# Patient Record
Sex: Male | Born: 1945 | Race: White | Marital: Married | Smoking: Former smoker
Health system: Northeastern US, Academic
[De-identification: ages and names within clinical notes are randomized; demographics above are authoritative.]

## PROBLEM LIST (undated history)

## (undated) DIAGNOSIS — I4891 Unspecified atrial fibrillation: Secondary | ICD-10-CM

## (undated) HISTORY — DX: Unspecified atrial fibrillation: I48.91

## (undated) HISTORY — PX: PROSTATE SURGERY: SHX751

## (undated) HISTORY — PX: PARTIAL NEPHRECTOMY: SHX414

---

## 2005-04-29 DIAGNOSIS — F528 Other sexual dysfunction not due to a substance or known physiological condition: Secondary | ICD-10-CM | POA: Insufficient documentation

## 2005-04-29 DIAGNOSIS — Z9889 Other specified postprocedural states: Secondary | ICD-10-CM | POA: Insufficient documentation

## 2005-04-29 DIAGNOSIS — N200 Calculus of kidney: Secondary | ICD-10-CM | POA: Insufficient documentation

## 2005-04-29 DIAGNOSIS — M129 Arthropathy, unspecified: Secondary | ICD-10-CM | POA: Insufficient documentation

## 2005-08-28 ENCOUNTER — Other Ambulatory Visit: Payer: Self-pay

## 2006-11-18 LAB — HM COLONOSCOPY

## 2009-06-02 ENCOUNTER — Ambulatory Visit
Admit: 2009-06-02 | Discharge: 2009-06-02 | Disposition: A | Discharge status: Home / Self Care | Payer: Self-pay | Source: Ambulatory Visit | Attending: Oncology | Admitting: Oncology

## 2009-06-08 ENCOUNTER — Ambulatory Visit
Admit: 2009-06-08 | Discharge: 2009-06-11 | Disposition: A | Discharge status: Home / Self Care | Payer: Self-pay | Source: Ambulatory Visit | Attending: Oncology | Admitting: Oncology

## 2009-10-12 ENCOUNTER — Ambulatory Visit: Payer: Self-pay | Admitting: Oncology

## 2009-10-18 NOTE — Progress Notes (Signed)
[name] is a 64 year old with prostate cancer and renal cell  carcinoma.  He is status post laparoscopic radical prostatectomy in  December 2004 for Gleason 8/10, P3, NX, MX adenocarcinoma of the prostate.  He received radiotherapy in April and May of 2005.  He was on androgen  deprivation therapy for detectable and rising PSA.  The ADT was  discontinued in August 2007 due to intractable and incapacitating hot  flashes.  He has been followed without treatment since then.  His PSA has  remained at the limit of detection.  Last year he had a CT scan as part of  workup for recurrent kidney stones.  It showed a 1.9 x 1.9 cm left renal  mass.  He underwent a laparoscopic partial nephrectomy in October 2010.  Pathology report indicated that the mass had been removed in multiple  fragments.  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  Margins could not be assessed and size of the tumor could not be  determined.  He is being followed without further intervention.  He returns  for a scheduled follow-up visit.    INTERVAL HISTORY AND REVIEW OF SYSTEMS:  Since his last clinic visit at the  end of October 2010, [name] felt well until recently when he was  found to have recurrent ureteral stones.  He appears to have undergone a  ureteroscopy and ablation of the stone.  Since then he has had urinary  frequency.  He is being managed by Dr. Wendie Chess.  No new bone pains.  His  appetite has been good.  ECOG performance status is 0.    PHYSICAL EXAMINATION:  [name] appeared well.  Weight 118.9 kg  compared to 118 kg in October.  Pulse 68, blood pressure 132/71.  Mr.  Munkres was deeply tanned having recently returned from [place].  No  palpable supraclavicular lymphadenopathy.  No tenderness over the rib cage  or vertebrae.  Lungs clear.  First and second heart sounds well heard.  No  gallops.  Abdomen had well healed surgical scar of the recent laparoscopic  partial left nephrectomy.  No  palpable abdominal masses.  No calf  tenderness.    DIAGNOSTICS:  From 09/22/2009 done through [facility]:  WBC  count 6,300; hematocrit 46, platelet count 284,000.  Chemistry panel:  Testosterone 181, PSA 0.01.    Per patient recent CT scan of the abdomen done through Dr. Flint Melter office  had shown no evidence of recurrence of the papillary renal carcinoma.    ASSESSMENT AND PLAN:  No clinical or biochemical evidence of recurrence of  prostate cancer.  Per report no radiographic evidence of recurrence or  metastases from the papillary renal carcinoma.  We will continue to monitor  him without intervention.  He will return in 4 months with routine labs and  a diagnostic PSA.  He knows to call if there are any questions, concerns or  new symptoms before that scheduled clinic visit.                Electronically Signed and Finalized  by  Marzella Schlein, MD 10/18/2009  11:52  ___________________________________________  Marzella Schlein, MD      DD:   10/12/2009  DT:   10/13/2009  1:31 P  WJX/[id number]  [id number]    cc:   Flossie Dibble, MD        Aura Dials, MD  Lillia Mountain, MD        Murlean Caller, MD

## 2010-01-23 ENCOUNTER — Ambulatory Visit
Admit: 2010-01-23 | Discharge: 2010-01-23 | Disposition: A | Discharge status: Home / Self Care | Payer: Self-pay | Source: Ambulatory Visit

## 2010-01-23 LAB — CBC AND DIFFERENTIAL
Baso # K/uL: 0 THOU/uL (ref 0.0–0.1)
Basophil %: 0.5 % (ref 0.2–1.2)
Eos # K/uL: 0.2 THOU/uL (ref 0.0–0.5)
Eosinophil %: 2.4 % (ref 0.8–7.0)
Hematocrit: 42 % (ref 40–51)
Hemoglobin: 14.7 g/dL (ref 13.7–17.5)
Lymph # K/uL: 1.5 THOU/uL (ref 1.3–3.6)
Lymphocyte %: 22.6 % (ref 21.8–53.1)
MCV: 94 fL — ABNORMAL HIGH (ref 79–92)
Mono # K/uL: 0.8 THOU/uL (ref 0.3–0.8)
Monocyte %: 12.7 % — ABNORMAL HIGH (ref 5.3–12.2)
Neut # K/uL: 4.1 THOU/uL (ref 1.8–5.4)
Platelets: 250 THOU/uL (ref 150–330)
RBC: 4.5 MIL/uL — ABNORMAL LOW (ref 4.6–6.1)
RDW: 12.9 % (ref 11.6–14.4)
Seg Neut %: 61.8 % (ref 34.0–67.9)
WBC: 6.6 THOU/uL (ref 4.2–9.1)

## 2010-01-23 LAB — COMPREHENSIVE METABOLIC PANEL
ALT: 49 U/L (ref 0–50)
AST: 33 U/L (ref 0–50)
Albumin: 4.3 g/dL (ref 3.5–5.2)
Alk Phos: 67 U/L (ref 40–130)
Anion Gap: 11 (ref 7–16)
Bilirubin,Total: 0.7 mg/dL (ref 0.0–1.2)
CO2: 27 mmol/L (ref 20–28)
Calcium: 8.9 mg/dL (ref 8.6–10.2)
Chloride: 102 mmol/L (ref 96–108)
Creatinine: 1.02 mg/dL (ref 0.67–1.17)
GFR,Black: >59 *
GFR,Caucasian: >59 *
Glucose: 81 mg/dL (ref 74–106)
Lab: 15 mg/dL (ref 6–20)
Potassium: 4.3 mmol/L (ref 3.3–5.1)
Sodium: 140 mmol/L (ref 133–145)
Total Protein: 7 g/dL (ref 6.3–7.7)

## 2010-01-23 LAB — LACTATE DEHYDROGENASE: LD: 145 U/L (ref 118–225)

## 2010-01-23 LAB — PSA (EFF.4-2010): PSA (eff. 4-2010): 0.01 ng/mL (ref 0.00–4.00)

## 2010-01-31 ENCOUNTER — Other Ambulatory Visit: Payer: Self-pay | Admitting: Oncology

## 2010-01-31 DIAGNOSIS — C61 Malignant neoplasm of prostate: Secondary | ICD-10-CM

## 2010-02-01 ENCOUNTER — Ambulatory Visit: Payer: Self-pay | Admitting: Oncology

## 2010-02-01 VITALS — BP 140/66 | HR 71 | Temp 98.2°F | Resp 17 | Wt 262.3 lb

## 2010-02-01 DIAGNOSIS — C61 Malignant neoplasm of prostate: Secondary | ICD-10-CM

## 2010-02-01 NOTE — Progress Notes (Signed)
Subjective:      Patient ID: Maxwell Wells is a 64 y.o. male    HPIMr. Wells is a 64 year old with prostate cancer and renal cell  carcinoma.  He is status post laparoscopic radical prostatectomy in  December 2004 for Gleason 8/10, P3, NX, MX adenocarcinoma of the prostate.  He received radiotherapy in April and May of 2005.  He was on androgen  deprivation therapy for detectable and rising PSA.  The ADT was  discontinued in August 2007 due to intractable and incapacitating hot  flashes.  He has been followed without treatment since then.  His PSA has  remained at the limit of detection.  Last year he had a CT scan as part of  workup for recurrent kidney stones.  It showed a 1.9 x 1.9 cm left renal  mass.  He underwent a laparoscopic partial nephrectomy in October 2010.  Pathology report indicated that the mass had been removed in multiple  fragments.  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  Margins could not be assessed and size of the tumor could not be  determined.  He is being followed without further intervention.  He returns  for a scheduled follow-up visit.    No current outpatient prescriptions on file.         Review of Systems   Constitutional: Negative for fatigue.   Respiratory: Negative for shortness of breath.    Cardiovascular: Positive for chest pain.        Arrythmia   Gastrointestinal: Negative for abdominal pain and blood in stool.   Genitourinary: Positive for hematuria.        Hematuria related to kidney stones.     Musculoskeletal: Positive for myalgias.   Neurological: Negative for dizziness and numbness.       Objective:   Filed Vitals:    02/01/10 1540   BP: 140/66   Pulse: 71   Temp: 36.8 C (98.2 F)   Resp: 17     Results for Maxwell Wells (MRN [id number]) as of 02/07/2010 15:28   Ref. Range 01/23/2010 16:23   Hemoglobin Latest Range: 13.7-17.5 g/dL 04.5   LD Latest Range: 118-225 U/L 145   Sodium Latest Range: 133-145 mmol/L 140   Potassium Latest Range:  3.3-5.1 mmol/L 4.3   Chloride Latest Range: 96-108 mmol/L 102   CO2 Latest Range: 20-28 mmol/L 27   ANION GAP Latest Range: 7-16  11   BUN Latest Range: 6-20 mg/dL 15   CREATININE Latest Range: 0.67-1.17 mg/dL 4.09   GFR MDRD Non Af Amer No range found > 59   GFR MDRD Af Amer No range found > 59   Calcium Latest Range: 8.6-10.2 mg/dL 8.9   Total Protein Latest Range: 6.3-7.7 g/dL 7.0   Albumin Latest Range: 3.5-5.2 g/dL 4.3   GLUCOSE Latest Range: 74-106 mg/dL 81   ALT Latest Range: 0-50 U/L 49   AST Latest Range: 0-50 U/L 33   Bilirubin, Total Latest Range: 0.0-1.2 mg/dL 0.7   ALK PHOS Latest Range: 40-130 U/L 67   WBC Latest Range: 4.2-9.1 THOU/uL 6.6   RBC Latest Range: 4.6-6.1 MIL/uL 4.5 (L)   Hematocrit Latest Range: 40-51 % 42   MCV Latest Range: 79-92 fL 94 (H)   RDW Latest Range: 11.6-14.4 % 12.9   Platelets Latest Range: 150-330 THOU/uL 250   Neut # K/uL Latest Range: 1.8-5.4 THOU/uL 4.1   Lymph # K/uL Latest Range: 1.3-3.6 THOU/uL 1.5   Mono #  K/uL Latest Range: 0.3-0.8 THOU/uL 0.8   Eos # K/uL Latest Range: 0.0-0.5 THOU/uL 0.2   Baso # K/uL Latest Range: 0.0-0.1 THOU/uL 0.0   Seg Neut % Latest Range: 34.0-67.9 % 61.8   Lymphocyte % Latest Range: 21.8-53.1 % 22.6   Monocyte % Latest Range: 5.3-12.2 % 12.7 (H)   Eosinophil % Latest Range: 0.8-7.0 % 2.4   Basophil % Latest Range: 0.2-1.2 % 0.5   PSA (eff. 11-2008) Latest Range: 0.00-4.00 ng/mL 0.12 Sep 2009: Testosterone 181, PSA 0.01.  Physical ExamNo acute distress alert and oriented.  HEENT sclerae clear anicteric mucosa moist and intact no palpable lymphadenopathy along cervical chain or supraclavicular.  Nontender  With light percussion to bony thorax.  Abdomen soft nontender bowel sounds positive extremities without edema calf soft and nontender.    Assessment:     5 -year-old man with barely PSA He remains without clinical biochemical evidence of prostate cancer.  Her the patient his urologist continues to follow him for his renal cell cancer and  he is due for a scan in July.  Plan:     1.  We will continue to monitor him without intervention.    2. He will return in 4 months with routine labs and  a diagnostic PSA.    3.He knows to call if there are any questions, concerns or  new symptoms before that scheduled clinic visit.    [name] was seen and discussed with Dr. Vesta Mixer.    Charlestine Night, NP.    I saw and evaluated the patient. I agree with the NP's findings and plan of care as documented above.     Ayesha Rumpf Vanita Ingles, MD

## 2010-05-16 ENCOUNTER — Ambulatory Visit
Admit: 2010-05-16 | Discharge: 2010-05-16 | Disposition: A | Discharge status: Home / Self Care | Payer: Self-pay | Source: Ambulatory Visit | Attending: Oncology | Admitting: Oncology

## 2010-05-16 LAB — CBC AND DIFFERENTIAL
Baso # K/uL: 0 THOU/uL (ref 0.0–0.1)
Basophil %: 0.6 % (ref 0.2–1.2)
Eos # K/uL: 0.2 THOU/uL (ref 0.0–0.5)
Eosinophil %: 2.1 % (ref 0.8–7.0)
Hematocrit: 43 % (ref 40–51)
Hemoglobin: 14.8 g/dL (ref 13.7–17.5)
Lymph # K/uL: 2 THOU/uL (ref 1.3–3.6)
Lymphocyte %: 28.6 % (ref 21.8–53.1)
MCV: 94 fL — ABNORMAL HIGH (ref 79–92)
Mono # K/uL: 0.9 THOU/uL — ABNORMAL HIGH (ref 0.3–0.8)
Monocyte %: 12.5 % — ABNORMAL HIGH (ref 5.3–12.2)
Neut # K/uL: 4 THOU/uL (ref 1.8–5.4)
Platelets: 255 THOU/uL (ref 150–330)
RBC: 4.5 MIL/uL — ABNORMAL LOW (ref 4.6–6.1)
RDW: 12.8 % (ref 11.6–14.4)
Seg Neut %: 56.2 % (ref 34.0–67.9)
WBC: 7.1 THOU/uL (ref 4.2–9.1)

## 2010-05-16 LAB — COMPREHENSIVE METABOLIC PANEL
ALT: 37 U/L (ref 0–50)
AST: 23 U/L (ref 0–50)
Albumin: 4.4 g/dL (ref 3.5–5.2)
Alk Phos: 66 U/L (ref 40–130)
Anion Gap: 10 (ref 7–16)
Bilirubin,Total: 0.6 mg/dL (ref 0.0–1.2)
CO2: 26 mmol/L (ref 20–28)
Calcium: 9.2 mg/dL (ref 8.6–10.2)
Chloride: 102 mmol/L (ref 96–108)
Creatinine: 0.87 mg/dL (ref 0.67–1.17)
GFR,Black: >59 *
GFR,Caucasian: >59 *
Glucose: 88 mg/dL (ref 74–106)
Lab: 20 mg/dL (ref 6–20)
Potassium: 4.4 mmol/L (ref 3.3–5.1)
Sodium: 138 mmol/L (ref 133–145)
Total Protein: 7.2 g/dL (ref 6.3–7.7)

## 2010-05-16 LAB — PSA (EFF.4-2010): PSA (eff. 4-2010): <0.01 ng/mL (ref 0.00–4.00)

## 2010-05-16 LAB — LACTATE DEHYDROGENASE: LD: 124 U/L (ref 118–225)

## 2010-05-24 ENCOUNTER — Ambulatory Visit: Payer: Self-pay | Admitting: Oncology

## 2010-05-24 VITALS — BP 125/71 | HR 62 | Temp 99.0°F | Resp 16 | Wt 262.1 lb

## 2010-05-24 DIAGNOSIS — C61 Malignant neoplasm of prostate: Secondary | ICD-10-CM

## 2010-06-12 NOTE — Progress Notes (Signed)
 Subjective:      Patient ID: Maxwell Wells is a 64 y.o. male    HPI  Maxwell Wells is a 64 year old with prostate cancer and renal cell carcinoma.  He is status post laparoscopic radical prostatectomy in December 2004 for Gleason 8/10, P3, NX, MX adenocarcinoma of the prostate. He received radiotherapy in April and May of 2005.  He was on ADT for detectable and rising PSA.  The ADT was discontinued in August 2007 due to intractable and incapacitating hot flashes.  He has been followed without treatment since then.  His PSA has  remained at the limit of detection.  Last year he had a CT scan as part of workup for recurrent kidney stones.  It showed a 1.9 x 1.9 cm left renal mass.  He underwent a laparoscopic partial nephrectomy in October 2010. Pathology report indicated that the mass had been removed in multiple fragments.  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  Margins could not be assessed and size of the tumor could not be determined.  He is being followed without further intervention.  He returns for a scheduled follow-up visit.    Current outpatient prescriptions   Medication   . metoprolol (TOPROL-XL) 25 MG 24 hr tablet   . simvastatin (ZOCOR) 40 MG tablet   . aspirin 81 MG EC tablet   . Multiple Vitamin (ONE-A-DAY MENS PO)   . ibuprofen (ADVIL,MOTRIN) 200 MG tablet         Review of Systems   Constitutional: Negative for fever, chills and fatigue.        ECOG 0-1.    HENT: Negative.    Eyes: Negative.    Respiratory: Negative.  Negative for cough, chest tightness and shortness of breath.    Cardiovascular: Negative.  Negative for chest pain, palpitations and leg swelling.   Gastrointestinal: Negative.  Negative for nausea, abdominal pain, constipation and abdominal distention.   Genitourinary: Negative for dysuria, hematuria, flank pain (no recent kidney stones.) and difficulty urinating.        He sees Dr. Manuela Schwartz every 6 months alternating between CT scans and U/S to monitor his  previously resected renal cell CA.   Musculoskeletal: Positive for arthralgias (generalized-chronic).   Skin: Negative.    Neurological: Negative.  Negative for dizziness, weakness, numbness and headaches.   Hematological: Negative.  Negative for adenopathy. Does not bruise/bleed easily.   Psychiatric/Behavioral: Negative.         Somewhat anxious awaiting today's PSA results           Objective:   Filed Vitals:    05/24/10 1547   BP: 125/71   Pulse: 62   Temp: 37.2 C (99 F)   Resp: 16        Ref. Range 05/16/2010 14:09   LD Latest Range: 118-225 U/L 124   Sodium Latest Range: 133-145 mmol/L 138   Potassium Latest Range: 3.3-5.1 mmol/L 4.4   Chloride Latest Range: 96-108 mmol/L 102   CO2 Latest Range: 20-28 mmol/L 26   Anion Gap Latest Range: 7-16  10   UN Latest Range: 6-20 mg/dL 20   Creatinine Latest Range: 0.67-1.17 mg/dL 1.91   GFR,Caucasian No range found > 59   GFR,Black No range found > 59   Glucose Latest Range: 74-106 mg/dL 88   Calcium Latest Range: 8.6-10.2 mg/dL 9.2   Total Protein Latest Range: 6.3-7.7 g/dL 7.2   Albumin Latest Range: 3.5-5.2 g/dL 4.4   ALT Latest Range:  0-50 U/L 37   AST Latest Range: 0-50 U/L 23   Bilirubin, Total Latest Range: 0.0-1.2 mg/dL 0.6   ALK PHOS Latest Range: 40-130 U/L 66   WBC Latest Range: 4.2-9.1 THOU/uL 7.1   RBC Latest Range: 4.6-6.1 MIL/uL 4.5 (L)   Hemoglobin Latest Range: 13.7-17.5 g/dL 45.4   Hematocrit Latest Range: 40-51 % 43   Platelets Latest Range: 150-330 THOU/uL 255   Neut # K/uL Latest Range: 1.8-5.4 THOU/uL 4.0      01/23/2010 16:23 05/16/2010 14:09   PSA (eff. 11-2008) 0.01 < 0.01         Physical Exam   Constitutional: He is oriented to person, place, and time. He appears well-developed and well-nourished. No distress.   HENT:   Head: Normocephalic and atraumatic.   Mouth/Throat: Oropharynx is clear and moist.   Eyes: Pupils are equal, round, and reactive to light. No scleral icterus.   Neck: Normal range of motion. Neck supple.   Cardiovascular: Normal  rate, regular rhythm and normal heart sounds.    Pulmonary/Chest: Effort normal and breath sounds normal. He has no wheezes. He has no rales.   Abdominal: Soft. Bowel sounds are normal. He exhibits no distension and no mass. No tenderness.        No inguinal or femoral nodes.     Musculoskeletal: Normal range of motion. He exhibits no edema and no tenderness.   Lymphadenopathy:     He has no cervical adenopathy.   Neurological: He is alert and oriented to person, place, and time.   Skin: Skin is warm and dry. No rash noted.   Psychiatric: He has a normal mood and affect. His behavior is normal. Judgment and thought content normal.       Assessment:     11 -year-old man with undetectable PSA. He remains without clinical or biochemical evidence of his prostate cancer.  Per the patient his urologist continues to follow him closely for his renal cell cancer.    Plan:     1.  We will continue to monitor him without intervention. He is compliant with his urologic follow up.     2. He will return in 4 months with routine labs and a diagnostic PSA.  He may push this visit out slightly as he and his wife are planning to go to [place] for several months this winter.    3. He knows to call if there are any questions, concerns or new symptoms before that scheduled clinic visit. I encouraged him to be compliant with his normal health maintenance activities.     Melbourne Abts NP

## 2010-10-15 ENCOUNTER — Ambulatory Visit
Admit: 2010-10-15 | Discharge: 2010-10-15 | Disposition: A | Discharge status: Home / Self Care | Payer: Self-pay | Source: Ambulatory Visit | Attending: Oncology | Admitting: Oncology

## 2010-10-15 ENCOUNTER — Telehealth: Payer: Self-pay | Admitting: Oncology

## 2010-10-15 LAB — COMPREHENSIVE METABOLIC PANEL
ALT: 39 U/L (ref 0–50)
AST: 25 U/L (ref 0–50)
Albumin: 4.4 g/dL (ref 3.5–5.2)
Alk Phos: 66 U/L (ref 40–130)
Anion Gap: 12 (ref 7–16)
Bilirubin,Total: 0.5 mg/dL (ref 0.0–1.2)
CO2: 24 mmol/L (ref 20–28)
Calcium: 8.5 mg/dL — ABNORMAL LOW (ref 8.6–10.2)
Chloride: 101 mmol/L (ref 96–108)
Creatinine: 0.89 mg/dL (ref 0.67–1.17)
GFR,Black: >59 *
GFR,Caucasian: >59 *
Glucose: 112 mg/dL — ABNORMAL HIGH (ref 74–106)
Lab: 18 mg/dL (ref 6–20)
Potassium: 4 mmol/L (ref 3.3–5.1)
Sodium: 137 mmol/L (ref 133–145)
Total Protein: 6.9 g/dL (ref 6.3–7.7)

## 2010-10-15 LAB — CBC AND DIFFERENTIAL
Baso # K/uL: 0 10*3/uL (ref 0.0–0.1)
Basophil %: 0.5 % (ref 0.2–1.2)
Eos # K/uL: 0.3 10*3/uL (ref 0.0–0.5)
Eosinophil %: 3.8 % (ref 0.8–7.0)
Hematocrit: 43 % (ref 40–51)
Hemoglobin: 14.7 g/dL (ref 13.7–17.5)
Lymph # K/uL: 1.6 10*3/uL (ref 1.3–3.6)
Lymphocyte %: 19 % — ABNORMAL LOW (ref 21.8–53.1)
MCV: 94 fL — ABNORMAL HIGH (ref 79–92)
Mono # K/uL: 0.8 10*3/uL (ref 0.3–0.8)
Monocyte %: 9.3 % (ref 5.3–12.2)
Neut # K/uL: 5.6 10*3/uL — ABNORMAL HIGH (ref 1.8–5.4)
Platelets: 245 10*3/uL (ref 150–330)
RBC: 4.5 MIL/uL — ABNORMAL LOW (ref 4.6–6.1)
RDW: 12.9 % (ref 11.6–14.4)
Seg Neut %: 67.4 % (ref 34.0–67.9)
WBC: 8.3 10*3/uL (ref 4.2–9.1)

## 2010-10-15 LAB — PSA (EFF.4-2010): PSA (eff. 4-2010): <0.02 ng/mL (ref 0.00–4.00)

## 2010-10-15 LAB — LACTATE DEHYDROGENASE: LD: 140 U/L (ref 118–225)

## 2010-10-15 NOTE — Telephone Encounter (Signed)
 Lab reqs faxed to [facility] [facility].Patient understands and agrees with the plan.

## 2010-10-17 ENCOUNTER — Other Ambulatory Visit: Payer: Self-pay | Admitting: Oncology

## 2010-10-17 DIAGNOSIS — C61 Malignant neoplasm of prostate: Secondary | ICD-10-CM

## 2010-10-18 ENCOUNTER — Encounter: Payer: Self-pay | Admitting: Oncology

## 2010-10-18 ENCOUNTER — Ambulatory Visit: Payer: Self-pay | Admitting: Oncology

## 2010-10-18 VITALS — BP 126/70 | HR 68 | Temp 97.9°F | Resp 17 | Wt 261.2 lb

## 2010-10-18 DIAGNOSIS — C61 Malignant neoplasm of prostate: Secondary | ICD-10-CM

## 2010-10-18 DIAGNOSIS — C649 Malignant neoplasm of unspecified kidney, except renal pelvis: Secondary | ICD-10-CM

## 2010-10-18 HISTORY — DX: Malignant neoplasm of prostate: C61

## 2010-10-18 HISTORY — DX: Malignant neoplasm of unspecified kidney, except renal pelvis: C64.9

## 2010-10-18 NOTE — Progress Notes (Signed)
 Subjective:      Patient ID: Tuvia Woodrick is a 65 y.o. male    HPI  Mr. Koenigs is a 65 year old with prostate cancer and renal cell carcinoma.  He is status post laparoscopic radical prostatectomy in December 2004 for Gleason 8/10, P3, NX, MX adenocarcinoma of the prostate. He received radiotherapy in April and May of 2005.  He was on ADT for detectable and rising PSA.  The ADT was discontinued in August 2007 due to intractable and incapacitating hot flashes.  He has been followed without treatment since then.  His PSA had  remained at the limit of detection.  Last year he had a CT scan as part of workup for recurrent kidney stones.  It showed a 1.9 x 1.9 cm left renal mass.  He underwent a laparoscopic partial nephrectomy in October 2010. Pathology report indicated that the mass had been removed in multiple fragments.  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  Margins could not be assessed and size of the tumor could not be determined.  He is being followed without further intervention.  He returns for a scheduled follow-up visit.      Current Outpatient Prescriptions   Medication   . metoprolol (TOPROL-XL) 25 MG 24 hr tablet   . simvastatin (ZOCOR) 40 MG tablet   . aspirin 81 MG EC tablet   . Multiple Vitamin (ONE-A-DAY MENS PO)   . ibuprofen (ADVIL,MOTRIN) 200 MG tablet       Review of Systems  Constitutional: Negative for fever, chills and fatigue. ECOG 0-1. Has been golfing in [place] recently.  HENT: Negative. Eyes: Negative.   Respiratory: Negative. Negative for cough, chest tightness and shortness of breath.   Cardiovascular: Negative. Negative for chest pain, palpitations and leg swelling.   Gastrointestinal: Negative. Negative for nausea, abdominal pain, constipation and abdominal distention.   Genitourinary: Negative for dysuria, hematuria, flank pain (no recent kidney stones.) and difficulty urinating.   He sees Dr. Manuela Schwartz every 6 months alternating between CT scans and U/S  to monitor his previously resected renal cell CA.   Musculoskeletal: Positive for arthralgias (generalized-chronic).   Skin: Negative.   Neurological: Negative. Negative for dizziness, weakness, numbness and headaches.   Hematological: Negative. Negative for adenopathy. Does not bruise/bleed easily.   Psychiatric/Behavioral: Negative. Somewhat anxious awaiting today's PSA results         Objective:     Filed Vitals:    10/18/10 1530   BP: 126/70   Pulse: 68   Temp: 36.6 C (97.9 F)   Resp: 17       Recent Results (from the past 72 hour(s))   CBC AND DIFFERENTIAL    Collection Time    10/15/10  4:18 PM       Component Value Range    WBC 8.3  4.2 - 9.1 (THOU/uL)    RBC 4.5 (*) 4.6 - 6.1 (MIL/uL)    Hemoglobin 14.7  13.7 - 17.5 (g/dL)    Hematocrit 43  40 - 51 (%)    MCV 94 (*) 79 - 92 (fL)    RDW 12.9  11.6 - 14.4 (%)    Platelets 245  150 - 330 (THOU/uL)    Seg Neut % 67.4  34.0 - 67.9 (%)    Lymphocyte % 19.0 (*) 21.8 - 53.1 (%)    Monocyte % 9.3  5.3 - 12.2 (%)    Eosinophil % 3.8  0.8 - 7.0 (%)    Basophil %  0.5  0.2 - 1.2 (%)    Neut # K/uL 5.6 (*) 1.8 - 5.4 (THOU/uL)    Lymph # K/uL 1.6  1.3 - 3.6 (THOU/uL)    Mono # K/uL 0.8  0.3 - 0.8 (THOU/uL)    Eos # K/uL 0.3  0.0 - 0.5 (THOU/uL)    Baso # K/uL 0.0  0.0 - 0.1 (THOU/uL)   COMPREHENSIVE METABOLIC PANEL    Collection Time    10/15/10  4:18 PM       Component Value Range    Sodium 137  133 - 145 (mmol/L)    Potassium 4.0  3.3 - 5.1 (mmol/L)    Chloride 101  96 - 108 (mmol/L)    CO2 24  20 - 28 (mmol/L)    Anion Gap 12  7 - 16     UN 18  6 - 20 (mg/dL)    Creatinine 1.61  0.96 - 1.17 (mg/dL)    GFR,Caucasian > 59      GFR,Black > 59      Glucose 112 (*) 74 - 106 (mg/dL)    Calcium 8.5 (*) 8.6 - 10.2 (mg/dL)    Total Protein 6.9  6.3 - 7.7 (g/dL)    Albumin 4.4  3.5 - 5.2 (g/dL)    Bilirubin,Total 0.5  0.0 - 1.2 (mg/dL)    AST 25  0 - 50 (U/L)    ALT 39  0 - 50 (U/L)    Alk Phos 66  40 - 130 (U/L)   LACTATE DEHYDROGENASE    Collection Time    10/15/10  4:18 PM        Component Value Range    LD 140  118 - 225 (U/L)   PSA (EFF.11-2008)    Collection Time    10/15/10  4:18 PM       Component Value Range    PSA (eff. 11-2008) < 0.02  0.00 - 4.00 (ng/mL)      01/23/2010 16:23 05/16/2010 14:09 10/15/2010 16:18   PSA (eff. 11-2008) 0.01 < 0.01 < 0.02         Physical Exam  Constitutional: He is oriented to person, place, and time. He appears well-developed and well-nourished. No distress.   HENT: Head: Normocephalic and atraumatic.   Mouth/Throat: Oropharynx is clear and moist.   Eyes: Pupils are equal, round, and reactive to light. No scleral icterus.   Neck: Normal range of motion. Neck supple.   Cardiovascular: Normal rate, regular rhythm and normal heart sounds.   Pulmonary/Chest: Effort normal and breath sounds normal. He has no wheezes. He has no rales.   Abdominal: Soft. Bowel sounds are normal. He exhibits no distension and no mass. No tenderness.   No inguinal or femoral nodes.  Musculoskeletal: Normal range of motion. He exhibits no edema and no tenderness.   Lymphadenopathy: He has no cervical or axillary adenopathy.   Neurological: He is alert and oriented to person, place, and time.   Skin: Skin is warm and dry. No rash noted. Very tanned.  Psychiatric: He has a normal mood and affect. His behavior is normal. Judgment and thought content normal.       Assessment:     65 -year-old man with undetectable PSA. He remains without clinical or biochemical evidence of his prostate cancer. Per the patient his urologist continues to follow him closely for his renal cell cancer.    Plan:     1. We will continue to monitor him without intervention.  He is compliant with his urologic follow up. We will track down report from imaging earlier this year done by urologist.    2. He will return in 4 months with routine labs and a diagnostic PSA. He may push this visit out after his next visit if his PSA is undetectable. We could alternate visits with Dr Manuela Schwartz.    3. He knows to call if there are any  questions, concerns or new symptoms before that scheduled clinic visit. I encouraged him to be compliant with his normal health maintenance activities.     Melbourne Abts NP

## 2010-12-10 ENCOUNTER — Ambulatory Visit: Payer: Self-pay | Admitting: Primary Care

## 2010-12-10 ENCOUNTER — Encounter: Payer: Self-pay | Admitting: Primary Care

## 2010-12-10 ENCOUNTER — Encounter: Payer: Self-pay | Admitting: Gastroenterology

## 2010-12-10 NOTE — Progress Notes (Signed)
 HPI   Maxwell Wells presents to our office today for new patient visit.  He is   transferring his care to our office from Dr. Philis Kendall.  He states   he is doing well.  He has a long past medical history outlined in Dr.   Benedict Needy notes.  He states he feels well.  1: Coronary artery disease-status post stent.  Has frequent palpitations at   night.  On simvastatin, aspirin, and Toprol.  Doing well otherwise.  Denies   chest pain.  2: Hyperlipidemia-tolerating simvastatin without difficulty.  No myalgias.    No GI side effects.  Due for laboratory tests in the fall.  3: Prostate cancer-status post surgery and radiation therapy.  Was on   Lupron.  [facility] at [facility]  4: Renal cell carcinoma-clinically in remission with negative scans status   post surgery.  Medications reviewed and reconciled.  Problem list updated.  Allergies   No Known Drug Allergy.  Current Meds   Aspirin 81 MG Tablet;TAKE 1 TABLET DAILY.; RPT  Calcium 600 TABS;TAKE 2 TABLETS DAILY.; RPT  Simvastatin 40 MG Tablet;TAKE 1 TABLET DAILY.; Rx  Metoprolol Succinate 25 MG Tablet Extended Release 24 Hour;TAKE 1 TABLET   DAILY.; Rx.  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  PSH   Prostatect Retropubic Radical W/ Nerve Sparing Laparoscopic  Total Knee Arthroplasty; DR Little.  Vital Signs   Recorded by Ananda Caya on 10 Dec 2010 06:59 AM  BP:138/80,   HR: 67 b/min,   Height: 75 in, Weight: 264 lb, BMI: 33 kg/m2,   O2 Sat: 98 (%SpO2).  Physical Exam   Pleasant gentleman in no apparent distress.  Skin warm and dry with actinic   changes.  No JVD.  No carotid bruits.  Lungs are clear.  Cardiac exam   reveals a regular rhythm.   No murmur rub or gallop.  No edema.  Patient Profile   No tobacco use.  Native language English.  Assessment   65 year old gentleman presents to our office today for followup of multiple   issues.  He appears medically stable.  Reviewed with the patient his   problem list and medication list.  He is agreeable to come back for   complete physical exam review health maintenance needs in the future.  He   is up-to-date with his vaccinations as well.  Health Mgmt Plan   Colonoscopy every 10 years; for HEALTH MAINTENANCE.  Signature   Electronically signed by: Samella Parr  M.D.; 12/10/2010 7:25 AM EST.

## 2010-12-10 NOTE — Miscellaneous (Unsigned)
 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 ; RPT   Calcium 600 TABS; TAKE 2 TABLETS DAILY. ; RPT   Metoprolol Succinate 25 MG Tablet Extended Release 24 Hour; TAKE 1 TABLET   DAILY. ; Rx   Simvastatin 40 MG Tablet; TAKE 1 TABLET DAILY. ; Rx     Immunizations  Pneumo (Pneumovax); #1   Pneumo (Pneumovax); #2   PPD

## 2011-01-24 ENCOUNTER — Encounter: Payer: Self-pay | Admitting: Gastroenterology

## 2011-01-28 ENCOUNTER — Ambulatory Visit
Admit: 2011-01-28 | Discharge: 2011-01-28 | Disposition: A | Discharge status: Home / Self Care | Payer: Self-pay | Source: Ambulatory Visit | Attending: Oncology | Admitting: Oncology

## 2011-01-28 DIAGNOSIS — C61 Malignant neoplasm of prostate: Secondary | ICD-10-CM

## 2011-01-28 LAB — CBC AND DIFFERENTIAL
Baso # K/uL: 0 10*3/uL (ref 0.0–0.1)
Basophil %: 0 % (ref 0.2–1.2)
Eos # K/uL: 0.3 10*3/uL (ref 0.0–0.5)
Eosinophil %: 3 % (ref 0.8–7.0)
Hematocrit: 41 % (ref 40–51)
Hemoglobin: 14.2 g/dL (ref 13.7–17.5)
Lymph # K/uL: 1.5 10*3/uL (ref 1.3–3.6)
Lymphocyte %: 18 % — ABNORMAL LOW (ref 21.8–53.1)
MCV: 94 fL — ABNORMAL HIGH (ref 79–92)
Mono # K/uL: 0.8 10*3/uL (ref 0.3–0.8)
Monocyte %: 10 % (ref 5.3–12.2)
Neut # K/uL: 5.7 10*3/uL — ABNORMAL HIGH (ref 1.8–5.4)
Platelets: 282 10*3/uL (ref 150–330)
RBC: 4.4 MIL/uL — ABNORMAL LOW (ref 4.6–6.1)
RDW: 12.8 % (ref 11.6–14.4)
Seg Neut %: 68 % — ABNORMAL HIGH (ref 34.0–67.9)
WBC: 8.4 10*3/uL (ref 4.2–9.1)

## 2011-01-28 LAB — COMPREHENSIVE METABOLIC PANEL
ALT: 34 U/L (ref 0–50)
AST: 19 U/L (ref 0–50)
Albumin: 4.4 g/dL (ref 3.5–5.2)
Alk Phos: 66 U/L (ref 40–130)
Anion Gap: 10 (ref 7–16)
Bilirubin,Total: 0.4 mg/dL (ref 0.0–1.2)
CO2: 26 mmol/L (ref 20–28)
Calcium: 8.9 mg/dL (ref 8.6–10.2)
Chloride: 104 mmol/L (ref 96–108)
Creatinine: 1.02 mg/dL (ref 0.67–1.17)
GFR,Black: >59 *
GFR,Caucasian: >59 *
Glucose: 88 mg/dL (ref 60–99)
Lab: 24 mg/dL — ABNORMAL HIGH (ref 6–20)
Potassium: 4.3 mmol/L (ref 3.3–5.1)
Sodium: 140 mmol/L (ref 133–145)
Total Protein: 6.9 g/dL (ref 6.3–7.7)

## 2011-01-28 LAB — TESTOSTERONE: Testosterone: 217 ng/dL (ref 193–740)

## 2011-01-28 LAB — LACTATE DEHYDROGENASE: LD: 125 U/L (ref 118–225)

## 2011-01-28 LAB — MYELOCYTE: Myelocyte %: 1 % — ABNORMAL HIGH (ref 0–0)

## 2011-01-28 LAB — PSA (EFF.4-2010): PSA (eff. 4-2010): <0.02 ng/mL (ref 0.00–4.00)

## 2011-01-28 LAB — MANUAL DIFFERENTIAL

## 2011-02-07 ENCOUNTER — Ambulatory Visit: Payer: Self-pay | Admitting: Oncology

## 2011-02-07 ENCOUNTER — Encounter: Payer: Self-pay | Admitting: Oncology

## 2011-02-07 VITALS — BP 127/59 | HR 80 | Temp 98.8°F | Resp 17 | Wt 258.6 lb

## 2011-02-07 DIAGNOSIS — C61 Malignant neoplasm of prostate: Secondary | ICD-10-CM

## 2011-02-07 NOTE — Progress Notes (Signed)
Subjective:      Patient ID: Maxwell Wells is a 65 y.o. male    HPI  Maxwell Wells is a 65 year old with prostate cancer and renal cell carcinoma. He is status post laparoscopic radical prostatectomy in December 2004 for Gleason 8/10, P3, NX, MX adenocarcinoma of the prostate. He received radiotherapy in April and May of 2005. He was on ADT for detectable and rising PSA. The ADT was discontinued in August 2007 due to intractable and incapacitating hot flashes. He has been followed without treatment since then. His PSA had remained at the limit of detection. Last year he had a CT scan as part of workup for recurrent kidney stones. It showed a 1.9 x 1.9 cm left renal mass. He underwent a laparoscopic partial nephrectomy in October 2010. Pathology report indicated that the mass had been removed in multiple fragments. 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 Margins could not be assessed and size of the tumor could not be determined. He is being followed without further intervention. He returns for a scheduled follow-up visit.    Current Outpatient Prescriptions   Medication   . metoprolol (TOPROL-XL) 25 MG 24 hr tablet   . simvastatin (ZOCOR) 40 MG tablet   . aspirin 81 MG EC tablet   . Multiple Vitamin (ONE-A-DAY MENS PO)   . ibuprofen (ADVIL,MOTRIN) 200 MG tablet       Review of Systems  Constitutional: Negative for fever, chills and fatigue. ECOG 0-1. He has been golfing recently.   HENT: Negative. Eyes: Negative.   Respiratory: Negative. Negative for cough, chest tightness and shortness of breath.   Cardiovascular: Negative. Negative for chest pain, and leg swelling. He does get intermittent palpitations and has had prior Holter monitoring done. He does see cardiology next month.   Gastrointestinal: Negative. Negative for nausea, abdominal pain, constipation and abdominal distention.   Genitourinary: Negative for dysuria, hematuria. + flank pain (recent kidney stones - underwent recent  retrieval via cystoscopy and dilatation of sphincter with improved urinary stream.) He sees Dr. Manuela Schwartz every 6 months alternating between CT scans and U/S to monitor his previously resected renal cell CA which he thinks will occur in next month again.   Musculoskeletal: Positive for arthralgias (generalized-chronic).   Skin: Negative.   Neurological: Negative. Negative for dizziness, weakness, numbness and headaches.   Hematological: Negative. Negative for adenopathy. Does not bruise/bleed easily.   Psychiatric/Behavioral: Negative. Somewhat anxious awaiting today's PSA results         Objective:     Filed Vitals:    02/07/11 1553   BP: 127/59   Pulse: 80   Temp: 37.1 C (98.8 F)   Resp: 17   Weight: 117.3 kg (258 lb 9.6 oz)          Ref. Range 01/28/2011 16:55   LD Latest Range: 118-225 U/L 125   Sodium Latest Range: 133-145 mmol/L 140   Potassium Latest Range: 3.3-5.1 mmol/L 4.3   Chloride Latest Range: 96-108 mmol/L 104   CO2 Latest Range: 20-28 mmol/L 26   Anion Gap Latest Range: 7-16  10   UN Latest Range: 6-20 mg/dL 24 (H)   Creatinine Latest Range: 0.67-1.17 mg/dL 3.08   GFR,Black No range found > 59   GFR,Caucasian No range found > 59   Glucose Latest Range: 60-99 mg/dL 88   Calcium Latest Range: 8.6-10.2 mg/dL 8.9   Total Protein Latest Range: 6.3-7.7 g/dL 6.9   Albumin Latest Range: 3.5-5.2 g/dL 4.4  ALT Latest Range: 0-50 U/L 34   AST Latest Range: 0-50 U/L 19   Alk Phos Latest Range: 40-130 U/L 66   Bilirubin,Total Latest Range: 0.0-1.2 mg/dL 0.4   Testosterone Latest Range: 193-740 ng/dL 161   WBC Latest Range: 4.2-9.1 THOU/uL 8.4   RBC Latest Range: 4.6-6.1 MIL/uL 4.4 (L)   Hemoglobin Latest Range: 13.7-17.5 g/dL 09.6   Hematocrit Latest Range: 40-51 % 41   MCV Latest Range: 79-92 fL 94 (H)   RDW Latest Range: 11.6-14.4 % 12.8   Platelets Latest Range: 150-330 THOU/uL 282   Neut # K/uL Latest Range: 1.8-5.4 THOU/uL 5.7 (H)         Physical Exam  Constitutional: He is oriented to person, place, and  time. He appears well-developed and well-nourished. No distress.   HENT: Head: Normocephalic and atraumatic.   Mouth/Throat: Oropharynx is clear and moist.   Eyes: Pupils are equal, round, and reactive to light. No scleral icterus.   Neck: Normal range of motion. Neck supple.   Cardiovascular: Normal rate, regular rhythm and normal heart sounds.   Pulmonary/Chest: Effort normal and breath sounds normal. He has no wheezes. He has no rales.   Abdominal: Soft. Bowel sounds are normal. He exhibits no distension and no mass. No tenderness.   No inguinal or femoral nodes.  Musculoskeletal: Normal range of motion. He exhibits no edema and no tenderness.   Lymphadenopathy: He has no cervical or axillary adenopathy.   Neurological: He is alert and oriented to person, place, and time.   Skin: Skin is warm and dry. No rash noted. Very tanned.  Psychiatric: He has a normal mood and affect. His behavior is normal. Judgment and thought content normal.     Assessment:     15 -year-old man with prostate cancer and undetectable PSA. He remains without clinical or biochemical evidence of his prostate cancer. Per the patient Dr Manuela Schwartz continues to follow him closely for his renal cell cancer.    Plan:     1. We will continue to monitor him without intervention. He is compliant with his urologic follow up.     2. He will return in 6-7 months with routine labs and a diagnostic PSA. As he continues to see urology as well - we will see him just prior to him going to [place] this winter. He would like to check labs prior to visit and about half way in between.    3. He knows to call if there are any questions, concerns or new symptoms before that scheduled clinic visit. I encouraged him to be compliant with his normal health maintenance activities. He will follow up with cardiology to discuss his palpitations.     Melbourne Abts NP

## 2011-03-05 ENCOUNTER — Encounter: Payer: Self-pay | Admitting: Gastroenterology

## 2011-03-11 ENCOUNTER — Encounter: Payer: Self-pay | Admitting: Gastroenterology

## 2011-03-13 ENCOUNTER — Encounter: Payer: Self-pay | Admitting: Gastroenterology

## 2011-03-18 ENCOUNTER — Encounter: Payer: Self-pay | Admitting: Gastroenterology

## 2011-03-25 ENCOUNTER — Encounter: Payer: Self-pay | Admitting: Primary Care

## 2011-03-25 ENCOUNTER — Ambulatory Visit: Payer: Self-pay | Admitting: Primary Care

## 2011-03-25 VITALS — BP 130/80 | HR 72 | Temp 99.3°F | Ht 74.0 in | Wt 256.0 lb

## 2011-03-25 DIAGNOSIS — R42 Dizziness and giddiness: Secondary | ICD-10-CM

## 2011-03-27 NOTE — Progress Notes (Signed)
Maxwell Wells presents to our office today for an acute visit.  He is a very pleasant 65 year old gentleman who has had vertigo over the last 24 hours.  He will with a spinning sensation is he moved his head.  This has gradually gotten better but he presents for followup.  No headaches.  No weakness or numbness.  No visual symptoms.  No diplopia.    Current Outpatient Prescriptions   Medication   . metoprolol (TOPROL-XL) 25 MG 24 hr tablet   . simvastatin (ZOCOR) 40 MG tablet   . aspirin 81 MG EC tablet   . Multiple Vitamin (ONE-A-DAY MENS PO)   . ibuprofen (ADVIL,MOTRIN) 200 MG tablet     Patient Active Problem List   Diagnoses Code   . Prostate cancer 185   . Renal cell cancer 189.0   . Male Erectile Disorder 302.72   . Arthritis 716.90   . Nephrolithiasis 592.0   . 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  Blood pressure 130/80, pulse 72, temperature 37.4 C (99.3 F), height 1.88 m (6\' 2" ), weight 116.121 kg (256 lb), SpO2 96.00%.  Pleasant gentleman is moderately obese.  Skin warm and dry.  Head and neck exam is entirely normal. No JVD.  No carotid bruits.  Lungs are clear.  Cardiac exam reveals regular rhythm.  No murmur rub or gallop.  No edema.  Finger to nose and gait normal. Cranial nerves intact.    65 year old gentleman with peripheral vertigo.  His symptoms are resolving so did not prescribe any medication.  I favor observation.  He will call me if his symptoms fail to resolve or worsen.

## 2011-05-31 ENCOUNTER — Ambulatory Visit: Payer: Self-pay

## 2011-07-29 ENCOUNTER — Ambulatory Visit
Admit: 2011-07-29 | Discharge: 2011-07-29 | Disposition: A | Discharge status: Home / Self Care | Payer: Self-pay | Source: Ambulatory Visit | Attending: Oncology | Admitting: Oncology

## 2011-07-29 DIAGNOSIS — C61 Malignant neoplasm of prostate: Secondary | ICD-10-CM

## 2011-07-29 LAB — CBC AND DIFFERENTIAL
Baso # K/uL: 0 10*3/uL (ref 0.0–0.1)
Basophil %: 0.4 % (ref 0.2–1.2)
Eos # K/uL: 0.1 10*3/uL (ref 0.0–0.5)
Eosinophil %: 1.8 % (ref 0.8–7.0)
Hematocrit: 45 % (ref 40–51)
Hemoglobin: 15.7 g/dL (ref 13.7–17.5)
Lymph # K/uL: 1.6 10*3/uL (ref 1.3–3.6)
Lymphocyte %: 23.4 % (ref 21.8–53.1)
MCV: 94 fL — ABNORMAL HIGH (ref 79–92)
Mono # K/uL: 0.9 10*3/uL — ABNORMAL HIGH (ref 0.3–0.8)
Monocyte %: 12.8 % — ABNORMAL HIGH (ref 5.3–12.2)
Neut # K/uL: 4.1 10*3/uL (ref 1.8–5.4)
Platelets: 254 10*3/uL (ref 150–330)
RBC: 4.8 MIL/uL (ref 4.6–6.1)
RDW: 12.8 % (ref 11.6–14.4)
Seg Neut %: 61.6 % (ref 34.0–67.9)
WBC: 6.7 10*3/uL (ref 4.2–9.1)

## 2011-07-29 LAB — COMPREHENSIVE METABOLIC PANEL
ALT: 33 U/L (ref 0–50)
AST: 21 U/L (ref 0–50)
Albumin: 4.4 g/dL (ref 3.5–5.2)
Alk Phos: 66 U/L (ref 40–130)
Anion Gap: 12 (ref 7–16)
Bilirubin,Total: 0.7 mg/dL (ref 0.0–1.2)
CO2: 25 mmol/L (ref 20–28)
Calcium: 8.8 mg/dL (ref 8.6–10.2)
Chloride: 106 mmol/L (ref 96–108)
Creatinine: 0.9 mg/dL (ref 0.67–1.17)
GFR,Black: >59 *
GFR,Caucasian: >59 *
Glucose: 115 mg/dL — ABNORMAL HIGH (ref 60–99)
Lab: 24 mg/dL — ABNORMAL HIGH (ref 6–20)
Potassium: 4.3 mmol/L (ref 3.3–5.1)
Sodium: 143 mmol/L (ref 133–145)
Total Protein: 7 g/dL (ref 6.3–7.7)

## 2011-07-29 LAB — LACTATE DEHYDROGENASE: LD: 122 U/L (ref 118–225)

## 2011-07-29 LAB — TESTOSTERONE: Testosterone: 205 ng/dL (ref 193–740)

## 2011-07-29 LAB — PSA (EFF.4-2010): PSA (eff. 4-2010): <0.02 ng/mL (ref 0.00–4.00)

## 2011-07-30 ENCOUNTER — Telehealth: Payer: Self-pay | Admitting: Oncology

## 2011-08-21 ENCOUNTER — Encounter: Payer: Self-pay | Admitting: Primary Care

## 2011-08-22 ENCOUNTER — Encounter: Payer: Self-pay | Admitting: Oncology

## 2011-08-22 ENCOUNTER — Encounter: Payer: Self-pay | Admitting: Primary Care

## 2011-08-22 ENCOUNTER — Ambulatory Visit: Payer: Self-pay | Admitting: Primary Care

## 2011-08-22 ENCOUNTER — Ambulatory Visit: Payer: Self-pay | Admitting: Oncology

## 2011-08-22 VITALS — BP 146/70 | HR 75 | Temp 98.2°F | Resp 16 | Ht 73.5 in | Wt 265.9 lb

## 2011-08-22 VITALS — BP 120/78 | HR 68 | Temp 99.2°F | Ht 73.5 in | Wt 266.0 lb

## 2011-08-22 DIAGNOSIS — C61 Malignant neoplasm of prostate: Secondary | ICD-10-CM

## 2011-08-22 DIAGNOSIS — M79601 Pain in right arm: Secondary | ICD-10-CM

## 2011-08-23 ENCOUNTER — Encounter: Payer: Self-pay | Admitting: Primary Care

## 2011-08-23 DIAGNOSIS — E785 Hyperlipidemia, unspecified: Secondary | ICD-10-CM | POA: Insufficient documentation

## 2011-10-28 ENCOUNTER — Ambulatory Visit: Payer: Self-pay | Admitting: Primary Care

## 2011-10-28 ENCOUNTER — Encounter: Payer: Self-pay | Admitting: Primary Care

## 2011-10-28 VITALS — BP 122/70 | HR 108 | Temp 97.8°F | Ht 74.0 in | Wt 256.0 lb

## 2011-10-28 DIAGNOSIS — R197 Diarrhea, unspecified: Secondary | ICD-10-CM

## 2011-10-29 ENCOUNTER — Telehealth: Payer: Self-pay | Admitting: Primary Care

## 2011-10-29 ENCOUNTER — Ambulatory Visit
Admit: 2011-10-29 | Discharge: 2011-10-29 | Disposition: A | Discharge status: Home / Self Care | Payer: Self-pay | Source: Ambulatory Visit | Attending: Primary Care | Admitting: Primary Care

## 2011-10-29 ENCOUNTER — Other Ambulatory Visit: Payer: Self-pay | Admitting: Primary Care

## 2011-10-29 DIAGNOSIS — R197 Diarrhea, unspecified: Secondary | ICD-10-CM

## 2011-10-29 LAB — CLOSTRIDIUM DIFFICILE EIA: C difficile Toxins A and B EIA: 0

## 2011-10-29 MED ORDER — CIPROFLOXACIN HCL 500 MG PO TABS *I*
500.0000 mg | ORAL_TABLET | Freq: Two times a day (BID) | ORAL | Status: DC
Start: 2011-10-29 — End: 2011-11-26

## 2011-10-29 NOTE — Telephone Encounter (Signed)
Maxwell Wells saw you yesterday. He has a fever now and he is vomiting and he has chills. What do you advise?

## 2011-10-30 LAB — SHIGA TOXIN: Shiga Toxin: 0

## 2011-10-30 LAB — GIARDIA/CRYPTOSPORIDIUM ANTIGEN: Giardia/Cryptosporidium Antigen: 0

## 2011-10-30 NOTE — Progress Notes (Signed)
66 year old gentleman presents to our office today for an acute visit.  He just returned from [place].  He has had diarrhea over the last 5 days.  He has had severe lower abdominal cramping and watery stools about 8-12 times today.  No fever or chills.  No nausea or vomiting.  No signs of bleeding.  Other than travel to [place] no other travel.  No unusual foods.  His wife is not ill.  They ate same foods basically.  No recent antibiotic use.  No previous history of GI illness.    Pleasant 66 year old gentleman who appears fatigued but not acutely ill.  Skin is tanned, warm, and dry.  No rashes.  His abdomen is soft and nondistended.  No tenderness.  No masses or organomegaly.    66 year old gentleman presents to our office today with a five-day history of diarrhea.  This is most likely a viral illness.  Advised low residue diet with plenty of liquids.  Check stool studies.  Consider empiric antibiotics if his symptoms worsen.

## 2011-10-31 ENCOUNTER — Telehealth: Payer: Self-pay | Admitting: Primary Care

## 2011-10-31 LAB — AEROBIC CULTURE: Aerobic Culture: 0

## 2011-10-31 NOTE — Telephone Encounter (Signed)
Call [facility] back after 12pm. He is on a plane right now and won't be available until then. Did you get results of stool sample? He is still having the same problem.

## 2011-11-08 ENCOUNTER — Telehealth: Payer: Self-pay

## 2011-11-08 DIAGNOSIS — E785 Hyperlipidemia, unspecified: Secondary | ICD-10-CM

## 2011-11-08 NOTE — Telephone Encounter (Signed)
Maxwell Wells has a physical on 11/26/11. Do you want anything added to the order that was put on by his other doctor? Let him know.(for Mon)

## 2011-11-11 ENCOUNTER — Encounter: Payer: Self-pay | Admitting: Primary Care

## 2011-11-11 NOTE — Telephone Encounter (Signed)
Ordered--let him know

## 2011-11-11 NOTE — Telephone Encounter (Signed)
Spoke to pt

## 2011-11-15 ENCOUNTER — Ambulatory Visit
Admit: 2011-11-15 | Discharge: 2011-11-15 | Disposition: A | Discharge status: Home / Self Care | Payer: Self-pay | Source: Ambulatory Visit | Attending: Primary Care | Admitting: Primary Care

## 2011-11-15 DIAGNOSIS — E785 Hyperlipidemia, unspecified: Secondary | ICD-10-CM

## 2011-11-15 LAB — COMPREHENSIVE METABOLIC PANEL
ALT: 37 U/L (ref 0–50)
AST: 23 U/L (ref 0–50)
Albumin: 4.3 g/dL (ref 3.5–5.2)
Alk Phos: 68 U/L (ref 40–130)
Anion Gap: 8 (ref 7–16)
Bilirubin,Total: 0.7 mg/dL (ref 0.0–1.2)
CO2: 25 mmol/L (ref 20–28)
Calcium: 8.8 mg/dL (ref 8.6–10.2)
Chloride: 105 mmol/L (ref 96–108)
Creatinine: 0.8 mg/dL (ref 0.67–1.17)
GFR,Black: 109 *
GFR,Caucasian: 94 *
Glucose: 95 mg/dL (ref 60–99)
Lab: 18 mg/dL (ref 6–20)
Potassium: 4.7 mmol/L (ref 3.3–5.1)
Sodium: 138 mmol/L (ref 133–145)
Total Protein: 7 g/dL (ref 6.3–7.7)

## 2011-11-15 LAB — LIPID PANEL
Chol/HDL Ratio: 3.2
Cholesterol: 142 mg/dL
HDL: 45 mg/dL
LDL Calculated: 80 mg/dL
Non HDL Cholesterol: 97 mg/dL
Triglycerides: 83 mg/dL

## 2011-11-26 ENCOUNTER — Ambulatory Visit
Admit: 2011-11-26 | Discharge: 2011-11-26 | Disposition: A | Discharge status: Home / Self Care | Payer: Self-pay | Source: Ambulatory Visit | Attending: Internal Medicine | Admitting: Internal Medicine

## 2011-11-26 ENCOUNTER — Ambulatory Visit: Payer: Self-pay | Admitting: Primary Care

## 2011-11-26 ENCOUNTER — Encounter: Payer: Self-pay | Admitting: Primary Care

## 2011-11-26 ENCOUNTER — Encounter: Payer: Self-pay | Admitting: Gastroenterology

## 2011-11-26 VITALS — BP 132/80 | HR 70 | Temp 98.0°F | Ht 74.0 in | Wt 258.4 lb

## 2011-11-26 DIAGNOSIS — Z9889 Other specified postprocedural states: Secondary | ICD-10-CM

## 2011-11-26 DIAGNOSIS — I4891 Unspecified atrial fibrillation: Secondary | ICD-10-CM | POA: Insufficient documentation

## 2011-11-26 DIAGNOSIS — N200 Calculus of kidney: Secondary | ICD-10-CM

## 2011-11-26 DIAGNOSIS — E785 Hyperlipidemia, unspecified: Secondary | ICD-10-CM

## 2011-11-26 LAB — HEPATIC FUNCTION PANEL
ALT: 45 U/L (ref 0–50)
AST: 27 U/L (ref 0–50)
Albumin: 4.6 g/dL (ref 3.5–5.2)
Alk Phos: 74 U/L (ref 40–130)
Bilirubin,Direct: 0.3 mg/dL (ref 0.0–0.3)
Bilirubin,Total: 0.9 mg/dL (ref 0.0–1.2)
Total Protein: 7.2 g/dL (ref 6.3–7.7)

## 2011-11-26 LAB — BASIC METABOLIC PANEL
Anion Gap: 11 (ref 7–16)
CO2: 23 mmol/L (ref 20–28)
Calcium: 10.1 mg/dL (ref 8.6–10.2)
Chloride: 101 mmol/L (ref 96–108)
Creatinine: 0.87 mg/dL (ref 0.67–1.17)
GFR,Black: 105 *
GFR,Caucasian: 91 *
Glucose: 105 mg/dL — ABNORMAL HIGH (ref 60–99)
Lab: 16 mg/dL (ref 6–20)
Potassium: 4.6 mmol/L (ref 3.3–5.1)
Sodium: 135 mmol/L (ref 133–145)

## 2011-11-26 LAB — TSH: TSH: 2.21 u[IU]/mL (ref 0.27–4.20)

## 2011-11-26 NOTE — H&P (Signed)
Maxwell Wells presents to our office today for a routine physical exam.  He has been troubled by some intermittent diarrhea and abdominal cramping last several months.  He is finally getting better.  Of note, is that he does have a family history of colon cancer he is due for his five-year colonoscopy.  He is therefore referred to GI for further evaluation and treatment.  Also, the patient has a history of intermittent palpitations and only sees his cardiologist once per year.  Etiology has been unclear.  He is now here today and has had some intermittent palpitations.  He is scheduled to see cardiology next month  No chest pain or shortness of breath.  No headaches.  No dizziness or lightheadedness.    Social history-patient is married and lives with his wife.  He does not smoke.  He drinks alcohol socially.  Is quite active enjoys golf and fishing.    Family history-noncontributory.    Medications reviewed and updated in EMR  List reflects end of visit medication list.  Problem list reviewed and updated    Current Outpatient Prescriptions   Medication Sig Dispense Refill   . atorvastatin (LIPITOR) 10 MG tablet Take 10 mg by mouth daily (with dinner)         . aspirin 81 MG EC tablet Take 81 mg by mouth daily.         Allergies  Food  Patient Active Problem List   Diagnoses Code   . Prostate cancer 185   . Renal cell cancer 189.0   . Male Erectile Disorder 302.72   . Arthritis 716.90   . Nephrolithiasis 592.0   . 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 Other and unspecified hyperlipidemia 272.4   . Atrial fibrillation 427.31     Body mass index is 33.18 kg/(m^2).  Blood pressure 132/80, pulse 70, temperature 36.7 C (98 F), height 1.88 m (6\' 2" ), weight 117.209 kg (258 lb 6.4 oz), SpO2 98.00%.  Pleasant gentleman is moderately obese.  Skin warm and dry.  Positive actinic changes.  Head and neck exam is unremarkable.  No lymphadenopathy.  No thyromegaly.  No carotid bruits or JVD.  His lungs are clear without any rales or  rhonchi.  Cardiac exam reveals an irregular rhythm.  Soft systolic murmur.  No S3 gallop.  Abdomen is soft and nontender.  No masses or organomegaly.  No aneurysm.  Extremities are normal without edema.  Neurologically intact.    EKG reveals atrial fibrillation with no acute ST-T wave changes.  Laboratory data pending.    66 year old gentleman presents to our office today for routine physical.  He is in atrial fibrillation.  His CHADS2 score is low so immediately place him on anticoagulation.  He is referred to cardiology for further evaluation and treatment.  Also, he has persistent GI symptoms and is referred to GI for routine colonoscopy and further evaluation.    Check laboratory data.  Followup for 6 months.

## 2011-12-04 ENCOUNTER — Encounter: Payer: Self-pay | Admitting: Gastroenterology

## 2011-12-05 ENCOUNTER — Telehealth: Payer: Self-pay | Admitting: Primary Care

## 2011-12-05 NOTE — Telephone Encounter (Signed)
FYI: Pt is going in for a few procedures at [facility] to have heart adjusted or put in sink not really sure and he is having problems with urinary he thinks bec of blood thinner but he is having a CT scan w/contrast, pt just wanted to give you info on what is happening.  May 3rd pt doing trans esphogial echo cardio

## 2011-12-10 ENCOUNTER — Telehealth: Payer: Self-pay | Admitting: Primary Care

## 2011-12-10 NOTE — Telephone Encounter (Signed)
Maxwell Wells is supposed to have a heart procedure on Friday.He has blood in his urine and saw his urologist. Maxwell Wells had a CT done yesterday at [facility]. He wants to make sure if he is doing the right things. Can you call him to discuss these issues?

## 2011-12-16 ENCOUNTER — Encounter: Payer: Self-pay | Admitting: Gastroenterology

## 2011-12-19 ENCOUNTER — Ambulatory Visit: Payer: Self-pay | Admitting: Internal Medicine

## 2011-12-19 ENCOUNTER — Encounter: Payer: Self-pay | Admitting: Internal Medicine

## 2011-12-19 VITALS — BP 122/70 | HR 92 | Temp 97.0°F | Ht 74.0 in | Wt 254.6 lb

## 2011-12-19 DIAGNOSIS — R42 Dizziness and giddiness: Secondary | ICD-10-CM

## 2011-12-19 MED ORDER — MECLIZINE HCL 25 MG PO TABS *I*
25.0000 mg | ORAL_TABLET | Freq: Three times a day (TID) | ORAL | Status: AC | PRN
Start: 2011-12-19 — End: 2011-12-29

## 2011-12-19 NOTE — Patient Instructions (Addendum)
i see no evidence of an ear infection.  Your vertigo is likely associated with the inner ear; Epley maneuver is a great idea, that can help to resolve.    Try some meclizine - you can take up to three times daily as needed.      If you get more vertiginous symptoms we can consider PT  For assistance with epley, or consider ENT for unrelenting problems.    Let us know if the vertigo becomes more bothersome.

## 2011-12-19 NOTE — Progress Notes (Signed)
HPI:  Maxwell Wells is here with c/o vertigo.  He was recently seen by his cardiologist, he has a fib and is on multaq not on anticoagulation, had some problems with hematuria.   He has had vertigo in the past.  Multiple medical issues, followed by cards, urology, has not been back to GI for c/o diarrhea, he still uses occasional loperamide. No nausea.  Today we note BP 140/98 wuth HR 100.  He tells me his BP id usually lower but his HR is usually elevated  vertogo started about one week ago, woke up with it; head turn starts it, not having it when up walking. Better in day, worse with quick postion change of head; feels it when he lays in bed at night. No falls or vision changes, no tinnitus.Feels like he has some "pressure" in head without HA,  Ears started to hurt (monday) with, gets spinning with laying down, not improving   We remeasure BP and he is 122/70 with HR 92.  He has irregular rhythm today. Occasional lightheadedness.    S: vertigo intermittently worse with laying down      Current Outpatient Prescriptions   Medication Sig   . dronedarone (MULTAQ) 400 MG tablet Take 400 mg by mouth 2 times daily (with meals)   . Multiple Vitamin (MULTIVITAMIN) per tablet Take 1 tablet by mouth daily   . atorvastatin (LIPITOR) 10 MG tablet Take 10 mg by mouth daily (with dinner)     . aspirin 81 MG EC tablet Take 81 mg by mouth daily.       O:  General: Well-appearing, conversant male  HEENT: Normocephalic, atraumatic;  PERRL, EOM intact, no nystagmus. conjunctiva WNL;  Bilateral auditory canals WNL.  Bilateral TMs translucent;  No submandibular lymph adenopathy; oropharynx moist, neck supple.  Cardiovascular: irregular rhythm, S1-S2. No LE edema.  Pulmonary: Lungs clear to auscultation.  Musculoskeletal: Moving all extremities, ambulatory.  Skin: clean, dry and intact.  Psychiatric: Mood and affect WNL.    Blood pressure 140/98, pulse 100, temperature 36.1 C (97 F), height 1.88 m (6\' 2" ), weight 115.486 kg (254 lb 9.6 oz),  SpO2 98.00%.    A: 66  Yo male  with PMH   Patient Active Problem List   Diagnosis Code   . Prostate cancer 185   . Renal cell cancer 189.0   . Male Erectile Disorder 302.72   . Arthritis 716.90   . Nephrolithiasis 592.0   . 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 Other and unspecified hyperlipidemia 272.4   . Atrial fibrillation 427.31    presents with complaint of vertigo, started one week ago, improving gradually, doing epley maneuver at home. No HA, tinnitus or nausea; has had this before. Vertigo.    P: trial meclizine 25 mg TID.  Continue Epley maneuvers.  Call if this fails to resolve or worsens, could consider PT or ENT for unrelenting.

## 2012-01-20 ENCOUNTER — Ambulatory Visit: Payer: Self-pay | Admitting: Internal Medicine

## 2012-01-20 ENCOUNTER — Encounter: Payer: Self-pay | Admitting: Internal Medicine

## 2012-01-20 ENCOUNTER — Encounter: Payer: Self-pay | Admitting: Gastroenterology

## 2012-01-20 VITALS — BP 128/72 | HR 74 | Temp 98.6°F | Ht 74.0 in | Wt 258.8 lb

## 2012-01-20 DIAGNOSIS — M7711 Lateral epicondylitis, right elbow: Secondary | ICD-10-CM

## 2012-01-20 NOTE — Patient Instructions (Addendum)
Tennis elbow is the common term for epicondylitis. i have provided you some written information regarding this, you can go to physical therapy for this, use ice, naproxen (2 tabs every 12 hours).  You can use the brace you have on the forearm.  I will be happy to send you to physical therapy if these measures don't produce improvement.

## 2012-01-20 NOTE — Progress Notes (Signed)
HPI:  Maxwell Wells is here with elbow pain.  Saw cardiologist today, they have DCd the multaq today he will start a betablocker instead, he does not know which one or what dose yet.  Pain is in right elbow, he woke up with the pain on Thursday, pain in the lateral epicondyle.  Tender forearm he is right handed. On Tuesday he had used a trimmer and had a lot of use of the arm, no wrist or hand pain .  No numbing or tingling.    S: right elbow pain      Current Outpatient Prescriptions   Medication Sig   . Multiple Vitamin (MULTIVITAMIN) per tablet Take 1 tablet by mouth daily   . atorvastatin (LIPITOR) 10 MG tablet Take 10 mg by mouth daily (with dinner)     . aspirin 81 MG EC tablet Take 81 mg by mouth daily.       O:  General: Well-appearing, conversant male  HEENT: Normocephalic,   EOM intact, conjunctiva WNL;   oropharynx moist, neck supple.  Cardiovascular: RRR, S1-S2.   Pulmonary: Lungs clear to auscultation.  Abdomen: Soft, nontender, nondistended;  Normoactive bowel sounds.  Musculoskeletal: Moving all extremities, ambulatory. Tender lateral epicondyle no grip strength change, no swelling or erythema.  Skin: clean, dry and intact.  Psychiatric: Mood and affect WNL.    Blood pressure 128/72, pulse 74, temperature 37 C (98.6 F), temperature source Temporal, height 1.88 m (6\' 2" ), weight 117.391 kg (258 lb 12.8 oz), SpO2 96.00%.    A: 66  Yo male  with PMH   Patient Active Problem List   Diagnosis Code   . Prostate cancer 185   . Renal cell cancer 189.0   . Male Erectile Disorder 302.72   . Arthritis 716.90   . Nephrolithiasis 592.0   . 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 Other and unspecified hyperlipidemia 272.4   . Atrial fibrillation 427.31    presents with complaint of right elbow pain. Used a Chiropodist and two days later had tender lateral epicondyle, no loss grip strength, no erythema, just tenderness.  Lateral epicondylitis.    P: NSAID, ice, exercises provided.  PT if desired, he will call me if he wants to  go.

## 2012-02-17 ENCOUNTER — Ambulatory Visit
Admit: 2012-02-17 | Discharge: 2012-02-17 | Disposition: A | Discharge status: Home / Self Care | Payer: Self-pay | Source: Ambulatory Visit | Attending: Oncology | Admitting: Oncology

## 2012-02-17 DIAGNOSIS — C61 Malignant neoplasm of prostate: Secondary | ICD-10-CM

## 2012-02-17 LAB — PSA (EFF.4-2010): PSA (eff. 4-2010): <0.02 ng/mL (ref 0.00–4.00)

## 2012-02-17 LAB — CBC AND DIFFERENTIAL
Baso # K/uL: 0 10*3/uL (ref 0.0–0.1)
Basophil %: 0.3 % (ref 0.2–1.2)
Eos # K/uL: 0.2 10*3/uL (ref 0.0–0.5)
Eosinophil %: 3.1 % (ref 0.8–7.0)
Hematocrit: 44 % (ref 40–51)
Hemoglobin: 15.1 g/dL (ref 13.7–17.5)
Lymph # K/uL: 1.7 10*3/uL (ref 1.3–3.6)
Lymphocyte %: 28.6 % (ref 21.8–53.1)
MCV: 94 fL — ABNORMAL HIGH (ref 79–92)
Mono # K/uL: 0.8 10*3/uL (ref 0.3–0.8)
Monocyte %: 13.1 % — ABNORMAL HIGH (ref 5.3–12.2)
Neut # K/uL: 3.2 10*3/uL (ref 1.8–5.4)
Platelets: 236 10*3/uL (ref 150–330)
RBC: 4.6 MIL/uL (ref 4.6–6.1)
RDW: 13.2 % (ref 11.6–14.4)
Seg Neut %: 54.9 % (ref 34.0–67.9)
WBC: 5.9 10*3/uL (ref 4.2–9.1)

## 2012-02-17 LAB — COMPREHENSIVE METABOLIC PANEL
ALT: 33 U/L (ref 0–50)
AST: 18 U/L (ref 0–50)
Albumin: 4.2 g/dL (ref 3.5–5.2)
Alk Phos: 63 U/L (ref 40–130)
Anion Gap: 11 (ref 7–16)
Bilirubin,Total: 1 mg/dL (ref 0.0–1.2)
CO2: 24 mmol/L (ref 20–28)
Calcium: 9.1 mg/dL (ref 8.6–10.2)
Chloride: 105 mmol/L (ref 96–108)
Creatinine: 0.91 mg/dL (ref 0.67–1.17)
GFR,Black: 102 *
GFR,Caucasian: 89 *
Glucose: 90 mg/dL (ref 60–99)
Lab: 18 mg/dL (ref 6–20)
Potassium: 4.6 mmol/L (ref 3.3–5.1)
Sodium: 140 mmol/L (ref 133–145)
Total Protein: 6.8 g/dL (ref 6.3–7.7)

## 2012-02-17 LAB — LACTATE DEHYDROGENASE: LD: 134 U/L (ref 118–225)

## 2012-02-17 LAB — TESTOSTERONE: Testosterone: 251 ng/dL (ref 193–740)

## 2012-02-20 ENCOUNTER — Encounter: Payer: Self-pay | Admitting: Oncology

## 2012-02-20 ENCOUNTER — Ambulatory Visit: Payer: Self-pay | Admitting: Oncology

## 2012-02-20 VITALS — BP 132/81 | HR 87 | Temp 98.1°F | Resp 18 | Ht 74.02 in | Wt 260.1 lb

## 2012-02-20 DIAGNOSIS — C61 Malignant neoplasm of prostate: Secondary | ICD-10-CM

## 2012-02-21 NOTE — Progress Notes (Signed)
Subjective:      Patient ID: Maxwell Wells is a 66 y.o. male    HPI   Maxwell Wells is a 66 year old with prostate cancer and renal cell carcinoma. He is status post laparoscopic radical prostatectomy in December 2004 for Gleason 8/10, P3, NX, MX adenocarcinoma of the prostate. He received radiotherapy in April and May of 2005. He was on ADT for detectable and rising PSA. The ADT was discontinued in August 2007 due to intractable and incapacitating hot flashes. He has been followed without treatment since then. His PSA had remained at the limit of detection. He had a CT scan as part of workup for recurrent kidney stones. It showed a 1.9 x 1.9 cm left renal mass. He underwent a laparoscopic partial nephrectomy in October 2010. Pathology report indicated that the mass had been removed in multiple fragments. 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 Margins could not be assessed and size of the tumor could not be determined. He is being followed without further intervention. He returns for a scheduled follow-up visit.    Current Outpatient Prescriptions   Medication   . metoprolol (TOPROL-XL) 50 MG 24 hr tablet   . Multiple Vitamin (MULTIVITAMIN) per tablet   . atorvastatin (LIPITOR) 10 MG tablet   . aspirin 81 MG EC tablet     No current facility-administered medications for this visit.       Review of Systems  Constitutional: Negative for fever, chills and fatigue. ECOG 0-1. He has been boating and golfing.    HENT: Negative. Eyes: Negative.   Respiratory: Negative. Negative for cough, chest tightness and shortness of breath.   Cardiovascular: He has had some prolonged episodes of A fib. He was on Multaq. Also had holter monitoring. Offered ablation procedure but declined. Has since been changed to metoprolol with better control of HR and rhythm. He was also on anticoagulation for a period of time but then had hematuria from kidney stones and subsequently stopped. He is now feeling well with  minimal symptoms - rarely feels any skipped beats.   Gastrointestinal: Negative. Negative for nausea, abdominal pain, constipation and abdominal distention.   Genitourinary: Negative for dysuria, hematuria. He sees Dr. Manuela Schwartz every 6 months alternating between CT scans and U/S to monitor his previously resected renal cell CA which he thinks will occur in next month again.   Musculoskeletal: Positive for arthralgias (generalized-chronic).   Skin: Negative.   Neurological: Negative. Negative for weakness, numbness and headaches. He did experience some dizziness felt to be related to labrynthitis that has also improved.   Hematological: Negative. Negative for adenopathy. Does not bruise/bleed easily.   Psychiatric/Behavioral: Negative. Somewhat anxious awaiting today's PSA results         Objective:     Filed Vitals:    02/20/12 1555   BP: 132/81   Pulse: 87   Temp: 36.7 C (98.1 F)   Resp: 18   Height: 188 cm (6' 2.02")   Weight: 118 kg (260 lb 2.3 oz)        Ref. Range 02/17/2012 13:20   LD Latest Range: 118-225 U/L 134   Sodium Latest Range: 133-145 mmol/L 140   Potassium Latest Range: 3.3-5.1 mmol/L 4.6   Chloride Latest Range: 96-108 mmol/L 105   CO2 Latest Range: 20-28 mmol/L 24   Anion Gap Latest Range: 7-16  11   UN Latest Range: 6-20 mg/dL 18   Creatinine Latest Range: 0.67-1.17 mg/dL 1.91   GFR,Black No range  found 102   GFR,Caucasian No range found 89   Glucose Latest Range: 60-99 mg/dL 90   Calcium Latest Range: 8.6-10.2 mg/dL 9.1   Total Protein Latest Range: 6.3-7.7 g/dL 6.8   Albumin Latest Range: 3.5-5.2 g/dL 4.2   ALT Latest Range: 0-50 U/L 33   AST Latest Range: 0-50 U/L 18   Alk Phos Latest Range: 40-130 U/L 63   Bilirubin,Total Latest Range: 0.0-1.2 mg/dL 1.0   Testosterone Latest Range: 193-740 ng/dL 161   WBC Latest Range: 4.2-9.1 THOU/uL 5.9   RBC Latest Range: 4.6-6.1 MIL/uL 4.6   Hemoglobin Latest Range: 13.7-17.5 g/dL 09.6   Hematocrit Latest Range: 40-51 % 44   MCV Latest Range: 79-92 fL 94 (H)    RDW Latest Range: 11.6-14.4 % 13.2   Platelets Latest Range: 150-330 THOU/uL 236   Neut # K/uL Latest Range: 1.8-5.4 THOU/uL 3.2   Lymph # K/uL Latest Range: 1.3-3.6 THOU/uL 1.7   Mono # K/uL Latest Range: 0.3-0.8 THOU/uL 0.8   Eos # K/uL Latest Range: 0.0-0.5 THOU/uL 0.2   Baso # K/uL Latest Range: 0.0-0.1 THOU/uL 0.0   Seg Neut % Latest Range: 34.0-67.9 % 54.9   Lymphocyte % Latest Range: 21.8-53.1 % 28.6   Monocyte % Latest Range: 5.3-12.2 % 13.1 (H)   Eosinophil % Latest Range: 0.8-7.0 % 3.1   Basophil % Latest Range: 0.2-1.2 % 0.3   PSA (eff. 11-2008) Latest Range: 0.00-4.00 ng/mL <0.02       Physical Exam  Constitutional: He is oriented to person, place, and time. He appears well-developed and well-nourished. No distress.   HENT: Head: Normocephalic and atraumatic.   Mouth/Throat: Oropharynx is clear and moist.   Eyes: Pupils are equal, round, and reactive to light. No scleral icterus.   Neck: Normal range of motion. Neck supple.   Cardiovascular: Normal rate, irregular rhythm and normal heart sounds.   Pulmonary/Chest: Effort normal and breath sounds normal. He has no wheezes. He has no rales.   Abdominal: Soft. Bowel sounds are normal. He exhibits no distension and no mass. No tenderness.   No inguinal or femoral nodes.  Musculoskeletal: Normal range of motion. He exhibits no edema and no tenderness.   Lymphadenopathy: He has no cervical or axillary adenopathy.   Neurological: He is alert and oriented to person, place, and time.   Skin: Skin is warm and dry. No rash noted. Very tanned.  Psychiatric: He has a normal mood and affect. His behavior is normal. Judgment and thought content normal.     Assessment:     78 -year-old man with prostate cancer and undetectable PSA. He remains without clinical or biochemical evidence of his prostate cancer. Per the patient Dr Manuela Schwartz continues to follow him closely for his renal cell cancer.    Plan:     1. We will continue to monitor him without intervention. He is  compliant with his urologic follow up.     2. He will return in 6-7 months with routine labs and a diagnostic PSA.      3. He knows to call if there are any questions, concerns or new symptoms before that scheduled clinic visit. I encouraged him to be compliant with his normal health maintenance activities. He will follow up with cardiology for his A fib.     Discussed with Dr Marylene Land,     Mel[place] Abts NP  .

## 2012-02-25 NOTE — Telephone Encounter (Signed)
error 

## 2012-03-06 ENCOUNTER — Encounter: Payer: Self-pay | Admitting: Gastroenterology

## 2012-03-06 ENCOUNTER — Ambulatory Visit
Admit: 2012-03-06 | Discharge: 2012-03-06 | Disposition: A | Discharge status: Home / Self Care | Payer: Self-pay | Source: Ambulatory Visit | Attending: Gastroenterology | Admitting: Gastroenterology

## 2012-03-06 LAB — HM COLONOSCOPY

## 2012-03-09 LAB — SURGICAL PATHOLOGY

## 2012-03-10 ENCOUNTER — Encounter: Payer: Self-pay | Admitting: Primary Care

## 2012-04-24 ENCOUNTER — Encounter: Payer: Self-pay | Admitting: Gastroenterology

## 2012-05-15 ENCOUNTER — Ambulatory Visit: Payer: Self-pay

## 2012-06-01 ENCOUNTER — Encounter: Payer: Self-pay | Admitting: Gastroenterology

## 2012-06-10 ENCOUNTER — Encounter: Payer: Self-pay | Admitting: Gastroenterology

## 2012-06-10 LAB — CBC AND DIFFERENTIAL
Basophil %: 0
Eosinophil %: 2
Hematocrit: 46
Hemoglobin: 16.4
Lymphocyte %: 26
MCV: 95
Monocyte %: 12
Neut # K/uL: 60 /uL
Platelets: 248
RBC: 4.88
WBC: 7.2

## 2012-06-10 LAB — BASIC METABOLIC PANEL
Anion Gap: 11
CO2: 23
Calcium: 9.7
Chloride: 109
Creatinine: 0.8
GFR,Black: 59
GFR,Caucasian: 59
Glucose: 106
Lab: 20
Potassium: 4.7
Sodium: 143

## 2012-06-24 ENCOUNTER — Encounter: Payer: Self-pay | Admitting: Primary Care

## 2012-06-28 ENCOUNTER — Encounter: Payer: Self-pay | Admitting: Gastroenterology

## 2012-07-13 ENCOUNTER — Encounter: Payer: Self-pay | Admitting: Gastroenterology

## 2012-07-17 ENCOUNTER — Encounter: Payer: Self-pay | Admitting: Primary Care

## 2012-07-20 ENCOUNTER — Encounter: Payer: Self-pay | Admitting: Gastroenterology

## 2012-07-27 ENCOUNTER — Ambulatory Visit: Payer: Self-pay | Admitting: Primary Care

## 2012-07-27 ENCOUNTER — Encounter: Payer: Self-pay | Admitting: Primary Care

## 2012-07-27 VITALS — BP 128/72 | HR 68 | Temp 97.6°F | Ht 75.0 in | Wt 263.0 lb

## 2012-07-27 DIAGNOSIS — E785 Hyperlipidemia, unspecified: Secondary | ICD-10-CM

## 2012-07-27 DIAGNOSIS — I4891 Unspecified atrial fibrillation: Secondary | ICD-10-CM

## 2012-07-28 NOTE — Progress Notes (Signed)
Maxwell Wells presents to our office today for a routine office visit.  He has multiple issues.  1: Atrial fibrillation-status post TEE and cardioversion.  For a while, he was on Xarelto but this was recently stopped by his cardiologist as he is in normal sinus rhythm and has a low CHADS-2 score.  He is currently on aspirin therapy.  He still has occasional palpitations.  Recent heart monitor showed he still does have atrial fibrillation.  2: Status post prostate cancer-still left with a erectile dysfunction in urinary incontinence.  He is somewhat frustrated by this.  3: Hematuria-followed by urology.  Hematuria becomes worse when he is on anticoagulant therapy.  This is one of the reasons he stopped Xarelto.  4: Hypertension-stable on metoprolol.  5: Hyperlipidemia-not atorvastatin 20 mg daily.  Due for recheck of lipid profile and liver function tests.    Medications reviewed and updated in EMR  List reflects end of visit medication list.  Problem list updated.  Current Outpatient Prescriptions   Medication Sig Dispense Refill   . TIKOSYN 500 MCG capsule        . metoprolol (TOPROL-XL) 50 MG 24 hr tablet Take 50 mg by mouth daily   Do not crush or chew. May be divided.       . Multiple Vitamin (MULTIVITAMIN) per tablet Take 1 tablet by mouth daily       . atorvastatin (LIPITOR) 10 MG tablet Take 10 mg by mouth daily (with dinner)         . aspirin 81 MG EC tablet Take 81 mg by mouth daily.         No current facility-administered medications for this visit.     Allergies---  Food  Patient Active Problem List   Diagnosis Code   . Prostate cancer 185   . Renal cell cancer 189.0   . Male Erectile Disorder 302.72   . Arthritis 716.90   . Nephrolithiasis 592.0   . 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 Other and unspecified hyperlipidemia 272.4   . Atrial fibrillation 427.31     Body mass index is 32.87 kg/(m^2).  Blood pressure 128/72, pulse 68, temperature 36.4 C (97.6 F), height 1.905 m (6\' 3" ), weight 119.296 kg (263 lb), SpO2  97.00%.    Pleasant gentleman is moderately obese but muscular.  Skin warm and dry some mild acne.  No JVD.  No carotid bruits.  His lungs are clear without any rales or rhonchi.  Cardiac exam reveals a regular rhythm.  Soft systolic murmur.  No S3 gallop.  No peripheral edema.  No neuropathy.    66 year old gentleman with multiple chronic issues.  Overall, he is doing well.  We did discuss ablation therapy for his atrial fibrillation.  This would allow him to come off his antiarrhythmic and beta blocker.  That, would be a plus especially if in the future he needs anticoagulation.   Absolute discuss this with cardiology.  Followup for physical exam 6 months.

## 2012-08-13 ENCOUNTER — Ambulatory Visit
Admit: 2012-08-13 | Discharge: 2012-08-13 | Disposition: A | Discharge status: Home / Self Care | Payer: Self-pay | Source: Ambulatory Visit | Attending: Oncology | Admitting: Oncology

## 2012-08-13 DIAGNOSIS — C61 Malignant neoplasm of prostate: Secondary | ICD-10-CM

## 2012-08-13 LAB — CBC AND DIFFERENTIAL
Baso # K/uL: 0 10*3/uL (ref 0.0–0.1)
Basophil %: 0.5 % (ref 0.2–1.2)
Eos # K/uL: 0.2 10*3/uL (ref 0.0–0.5)
Eosinophil %: 2.3 % (ref 0.8–7.0)
Hematocrit: 46 % (ref 40–51)
Hemoglobin: 15.9 g/dL (ref 13.7–17.5)
Lymph # K/uL: 2.2 10*3/uL (ref 1.3–3.6)
Lymphocyte %: 28.1 % (ref 21.8–53.1)
MCV: 96 fL — ABNORMAL HIGH (ref 79–92)
Mono # K/uL: 0.8 10*3/uL (ref 0.3–0.8)
Monocyte %: 10.5 % (ref 5.3–12.2)
Neut # K/uL: 4.5 10*3/uL (ref 1.8–5.4)
Nucl RBC # K/uL: 0 10*3/uL
Nucl RBC %: 0 /100 WBC (ref 0.0–0.2)
Platelets: 262 10*3/uL (ref 150–330)
RBC: 4.8 MIL/uL (ref 4.6–6.1)
RDW: 12.7 % (ref 11.6–14.4)
Seg Neut %: 58.6 % (ref 34.0–67.9)
WBC: 7.7 10*3/uL (ref 4.2–9.1)

## 2012-08-13 LAB — COMPREHENSIVE METABOLIC PANEL
ALT: 42 U/L (ref 0–50)
AST: 15 U/L (ref 0–50)
Albumin: 4.6 g/dL (ref 3.5–5.2)
Alk Phos: 78 U/L (ref 40–130)
Anion Gap: 11 (ref 7–16)
Bilirubin,Total: 0.8 mg/dL (ref 0.0–1.2)
CO2: 27 mmol/L (ref 20–28)
Calcium: 9.4 mg/dL (ref 8.6–10.2)
Chloride: 103 mmol/L (ref 96–108)
Creatinine: 0.89 mg/dL (ref 0.67–1.17)
GFR,Black: 104 *
GFR,Caucasian: 90 *
Glucose: 93 mg/dL (ref 60–99)
Lab: 23 mg/dL — ABNORMAL HIGH (ref 6–20)
Potassium: 4.7 mmol/L (ref 3.3–5.1)
Sodium: 141 mmol/L (ref 133–145)
Total Protein: 7.2 g/dL (ref 6.3–7.7)

## 2012-08-13 LAB — PSA (EFF.4-2010): PSA (eff. 4-2010): <0.02 ng/mL (ref 0.00–4.00)

## 2012-08-13 LAB — LACTATE DEHYDROGENASE: LD: 125 U/L (ref 118–225)

## 2012-08-13 LAB — TESTOSTERONE: Testosterone: 336 ng/dL (ref 193–740)

## 2012-08-20 ENCOUNTER — Ambulatory Visit: Payer: Self-pay | Admitting: Oncology

## 2012-08-20 ENCOUNTER — Encounter: Payer: Self-pay | Admitting: Oncology

## 2012-08-20 VITALS — BP 128/71 | HR 68 | Temp 98.1°F | Resp 17 | Ht 75.0 in | Wt 261.2 lb

## 2012-08-20 DIAGNOSIS — C61 Malignant neoplasm of prostate: Secondary | ICD-10-CM

## 2012-08-21 NOTE — Progress Notes (Addendum)
Subjective:      Patient ID: Maxwell Wells is a 67 y.o. male    HPI Maxwell Wells is a 67 year old with prostate cancer and renal cell carcinoma. He is status post laparoscopic radical prostatectomy in December 2004 for Gleason 8/10, P3, NX, MX adenocarcinoma of the prostate. He received radiotherapy in April and May of 2005. He was on ADT for detectable and rising PSA. The ADT was discontinued in August 2007 due to intractable and incapacitating hot flashes. He has been followed without treatment since then. His PSA had remained at the limit of detection. He had a CT scan as part of workup for recurrent kidney stones. It showed a 1.9 x 1.9 cm left renal mass. He underwent a laparoscopic partial nephrectomy in October 2010. Pathology report indicated that the mass had been removed in multiple fragments. 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 Margins could not be assessed and size of the tumor could not be determined. He is being followed without further intervention. He returns for a scheduled follow-up visit.      Interval History:   Maxwell Wells is doing well today. He presents with his wife. He had several episodes of A-fib with RVR and was switched to Tikosyn for rate control. Prior to this, he underwent DC cardioversion which he reports was minimally successful. His Cardiologist is Dr. Cammie Mcgee.     Current Outpatient Prescriptions   Medication   . TIKOSYN 500 MCG capsule   . metoprolol (TOPROL-XL) 50 MG 24 hr tablet   . Multiple Vitamin (MULTIVITAMIN) per tablet   . atorvastatin (LIPITOR) 10 MG tablet   . aspirin 81 MG EC tablet     No current facility-administered medications for this visit.       Review of SystemsConstitutional: Negative for fever, chills and fatigue. ECOG 0-1.  HENT: Negative. Eyes: Negative.   Respiratory: Negative. Negative for cough, chest tightness and shortness of breath.   Cardiovascular: He has had some prolonged episodes of A fib. He was on Multaq.  Also had holter monitoring. Offered ablation procedure but declined. Has since been changed to tikosyn with better control of HR and rhythm. He was also on anticoagulation for a period of time but then had hematuria from kidney stones and subsequently stopped. He is now feeling well with minimal symptoms - rarely feels any skipped beats.   Gastrointestinal: Negative. Negative for nausea, abdominal pain, constipation and abdominal distention.   Genitourinary: Negative for dysuria, hematuria. Has had a kidney stone every 3 months or so.   Musculoskeletal: Negative today  Skin: Negative.   Neurological: Negative. Negative for weakness, numbness and headaches.  Hematological: Negative. Negative for adenopathy. Does not bruise/bleed easily.   Psychiatric/Behavioral: Negative.         Objective:     Filed Vitals:    08/20/12 1536   BP: 128/71   Pulse: 68   Temp: 36.7 C (98.1 F)   Resp: 17   Height: 190.5 cm (6\' 3" )   Weight: 118.5 kg (261 lb 3.9 oz)       Results for Maxwell Wells, Maxwell Wells (MRN [id number]) as of 08/21/2012 09:55   Ref. Range 08/13/2012 15:47   LD Latest Range: 118-225 U/L 125   Sodium Latest Range: 133-145 mmol/L 141   Potassium Latest Range: 3.3-5.1 mmol/L 4.7   Chloride Latest Range: 96-108 mmol/L 103   CO2 Latest Range: 20-28 mmol/L 27   Anion Gap Latest Range: 7-16  11   UN Latest  Range: 6-20 mg/dL 23 (H)   Creatinine Latest Range: 0.67-1.17 mg/dL 6.04   GFR,Black No range found 104   GFR,Caucasian No range found 90   Glucose Latest Range: 60-99 mg/dL 93   Calcium Latest Range: 8.6-10.2 mg/dL 9.4   Total Protein Latest Range: 6.3-7.7 g/dL 7.2   Albumin Latest Range: 3.5-5.2 g/dL 4.6   ALT Latest Range: 0-50 U/L 42   AST Latest Range: 0-50 U/L 15   Alk Phos Latest Range: 40-130 U/L 78   Bilirubin,Total Latest Range: 0.0-1.2 mg/dL 0.8   Testosterone Latest Range: 193-740 ng/dL 540   WBC Latest Range: 4.2-9.1 THOU/uL 7.7   RBC Latest Range: 4.6-6.1 MIL/uL 4.8   Hemoglobin Latest Range: 13.7-17.5 g/dL 98.1    Hematocrit Latest Range: 40-51 % 46   MCV Latest Range: 79-92 fL 96 (H)   RDW Latest Range: 11.6-14.4 % 12.7   Platelets Latest Range: 150-330 THOU/uL 262   Neut # K/uL Latest Range: 1.8-5.4 THOU/uL 4.5   Lymph # K/uL Latest Range: 1.3-3.6 THOU/uL 2.2   Mono # K/uL Latest Range: 0.3-0.8 THOU/uL 0.8   Eos # K/uL Latest Range: 0.0-0.5 THOU/uL 0.2   Baso # K/uL Latest Range: 0.0-0.1 THOU/uL 0.0   Nucl RBC # K/uL No range found 0.0   Seg Neut % Latest Range: 34.0-67.9 % 58.6   Lymphocyte % Latest Range: 21.8-53.1 % 28.1   Monocyte % Latest Range: 5.3-12.2 % 10.5   Eosinophil % Latest Range: 0.8-7.0 % 2.3   Basophil % Latest Range: 0.2-1.2 % 0.5   Nucl RBC % Latest Range: 0.0-0.2 /100 WBC 0.0   PSA (eff. 11-2008) Latest Range: 0.00-4.00 ng/mL <0.02       Physical ExamConstitutional: He is oriented to person, place, and time. He appears well-developed and well-nourished. No distress.   HENT: Head: Normocephalic and atraumatic. (+) some facial plethora  Mouth/Throat: Oropharynx is clear and moist.   Eyes: Pupils are equal, round, and reactive to light. No scleral icterus.   Neck: Normal range of motion. Neck supple.   Cardiovascular: Normal rate, irregular rhythm and normal heart sounds.   Pulmonary/Chest: Effort normal and breath sounds normal. He has no wheezes. He has no rales.   Abdominal: Soft. Bowel sounds are normal. He exhibits no distension and no mass. No tenderness.   Musculoskeletal: Normal range of motion. He exhibits no edema and no tenderness.   Lymphadenopathy: He has no cervical or axillary adenopathy.   Neurological: He is alert and oriented to person, place, and time.   Skin: Skin is warm and dry. No rash noted. Very tanned.  Psychiatric: He has a normal mood and affect. His behavior is normal. Judgment and thought content normal.     Assessment:     67 -year-old man with prostate cancer and undetectable PSA. He remains without clinical or biochemical evidence of his prostate cancer. Per the patient, he  still follows with his Urologist, Dr Manuela Schwartz for his renal cell cancer.    Plan:     1. We will continue to monitor him without intervention. He is compliant with his urologic follow up. We have asked him to send [place] copies of his CT scans when they are done for his RCC.     2. He will return in 6-7 months with routine labs and a diagnostic PSA.      3. He knows to call if there are any questions, concerns or new symptoms before that scheduled clinic visit.     The  patient was seen and examined with attending, Dr. Marylene Land.     I saw and evaluated Maxwell Wells. I agree with Dr. Clover Mealy findings and plan of care as documented above.     Ayesha Rumpf Vanita Ingles, MD

## 2012-09-02 ENCOUNTER — Encounter: Payer: Self-pay | Admitting: Gastroenterology

## 2012-09-07 ENCOUNTER — Encounter: Payer: Self-pay | Admitting: Gastroenterology

## 2012-10-13 ENCOUNTER — Telehealth: Payer: Self-pay | Admitting: Primary Care

## 2012-10-13 NOTE — Telephone Encounter (Signed)
For your return---Pt's wife is calling on behalf of pt stating they are currently in [place]. Wife is taking husband to the ED today because he is having blood clots in his urine. Pt wanted Dr D to be aware of this. They are aware that you are out of the office.

## 2012-10-30 ENCOUNTER — Telehealth: Payer: Self-pay | Admitting: Primary Care

## 2012-10-30 NOTE — Telephone Encounter (Signed)
Ok for referral?

## 2012-10-30 NOTE — Telephone Encounter (Signed)
[facility] needs a [employer] referral to Dr Barbaraann Share for left hip pain. MBC ID # [id number].

## 2012-11-02 NOTE — Telephone Encounter (Signed)
Effective 11/02/12 to Dr Barbaraann Share, referral # [id number] O/U.

## 2012-11-25 ENCOUNTER — Encounter: Payer: Self-pay | Admitting: Gastroenterology

## 2012-12-21 ENCOUNTER — Encounter: Payer: Self-pay | Admitting: Gastroenterology

## 2013-01-08 ENCOUNTER — Encounter: Payer: Self-pay | Admitting: Gastroenterology

## 2013-01-13 ENCOUNTER — Telehealth: Payer: Self-pay | Admitting: Primary Care

## 2013-01-13 NOTE — Telephone Encounter (Signed)
Effective 01/13/13; open ended/unlimited visits; MR [id number].

## 2013-01-13 NOTE — Telephone Encounter (Signed)
ok 

## 2013-01-13 NOTE — Telephone Encounter (Signed)
Referral please

## 2013-01-13 NOTE — Telephone Encounter (Signed)
Ref to Dr Barbaraann Share, NPI [id number], Left shoulder pain  VYU [phone]

## 2013-01-24 ENCOUNTER — Encounter: Payer: Self-pay | Admitting: Gastroenterology

## 2013-01-27 ENCOUNTER — Encounter: Payer: Self-pay | Admitting: Gastroenterology

## 2013-01-29 ENCOUNTER — Encounter: Payer: Self-pay | Admitting: Gastroenterology

## 2013-02-04 ENCOUNTER — Encounter: Payer: Self-pay | Admitting: Gastroenterology

## 2013-02-05 ENCOUNTER — Encounter: Payer: Self-pay | Admitting: Internal Medicine

## 2013-02-05 ENCOUNTER — Encounter: Payer: Self-pay | Admitting: Primary Care

## 2013-02-05 ENCOUNTER — Ambulatory Visit: Payer: Self-pay | Admitting: Internal Medicine

## 2013-02-05 VITALS — BP 140/76 | HR 77 | Temp 98.5°F | Ht 75.0 in | Wt 264.0 lb

## 2013-02-05 DIAGNOSIS — I10 Essential (primary) hypertension: Secondary | ICD-10-CM

## 2013-02-05 DIAGNOSIS — R200 Anesthesia of skin: Secondary | ICD-10-CM

## 2013-02-05 LAB — PCMH FALL RISK ASSESSMENT

## 2013-02-05 NOTE — Progress Notes (Signed)
Subjective: Patient comes in with a few concerns today.  He tells me over the past 10 days he's been having "numerous scans including CTs, MRIs, Cortizone injection yesterday to his left shoulder for a tear and cryoablation for a left renal mass on 01/27/13.    He is concerned because 2 days after his cryoablation, he developed a numb sensation across his left lower abdomen with discomfort.  He tells me it is annoyingly tender.   Overall this is slowly improving, however he is worried about it.  He denies any abdominal symptoms such as nausea, vomiting, bowel change or blood in the stool.  He has baseline urinary symptoms from prostate and kidney cancer tells me these have not changed.    He recalls being diagnosed with shingles in the past many years ago by Dr. Maretta Bees.  He tells me the rash at that time wrapped around his belt line bilaterally.  He does wonder if this might be shingles.    He also tells me that he's had 2 incidences where he awakens with pain that makes it impossible for him to tap his thumb and second finger together.  This occurred once in the left hand and then 10 days later occurred once in the right hand.  He denies that he had any swelling, stiffness, redness or warmth.  This is fine now and has not recurred.  He denies any other extremity numbness, weakness.    I have reviewed the note from the patient's cryoablation for left renal mass on 618/14.  He had 3 injections into his left back while lying prone on his abdomen "for a long time" per the patient.    He does have a history of hypertension.  He is taking his medicines as prescribed.    Current Outpatient Prescriptions on File Prior to Visit   Medication Sig Dispense Refill   . TIKOSYN 500 MCG capsule        . metoprolol (TOPROL-XL) 50 MG 24 hr tablet Take 50 mg by mouth daily   Do not crush or chew. May be divided.       . Multiple Vitamin (MULTIVITAMIN) per tablet Take 1 tablet by mouth daily       . atorvastatin (LIPITOR) 10 MG tablet  Take 10 mg by mouth daily (with breakfast)          . aspirin 81 MG EC tablet Take 81 mg by mouth daily.         No current facility-administered medications on file prior to visit.     Medications reviewed, no changes made.      Objective: Patient is alert, oriented x3, in no acute distress.  He does have a slightly anxious affect and expresses concern that he is having another health problem developing. Ambulatory.  Blood pressure 140/76, pulse 77, temperature 36.9 C (98.5 F), height 1.905 m (6\' 3" ), weight 119.75 kg (264 lb), SpO2 96.00%.  BMI 33.  Repeat blood pressure at the end of the visit 130/64.  Skin warm and dry without rash or jaundice.  Patient does have 1 papule on his left abdomen that he tells me is from a mosquito bite.  Otherwise there is no rash or abnormality around the area of numbness on his left lower abdomen as well as elsewhere on his body.  Eyes anicteric, conjunctiva clear.  HEENT WNL.    No lymphadenopathy.    Respirations easy.  Lungs clear with good airflow.    Heart RRR S1, S2  without murmur noted.    Abdomen soft, large, nontender, positive bowel sounds.  No HSM.  He does have a palpable mass in the right lower abdomen that he tells me is a fluid-filled pump that helps with urination.  He tells me there is numbness and different sensation as I touch his left lower abdomen lately versus elsewhere on his body.  No tenderness.  No extremity edema. Full range of motion.  Good radial and pedal pulses.   Negative Tinel's and negative Phalen's sign bilaterally.    Assessment and plan: 67 year-old male with history of hypertension, prostate cancer and renal cancer presents with a couple concerns today-  #1.  One week history of a numb sensation in the left lower abdomen.  Reassured that there is no evidence of shingles.  I have asked him to contact Dr. Sharen Heck to see if this is possibly a side effect from the cryoablation.  It involves extremely cold temperatures as well as lying prone for a  period of time. I question if that may have affected the nerves innervating the abdominal tissue leading to numbness.  At this time it is steadily improving and will monitor.      #2.  Intermittent pain of the hand/wrist area.  It is not tested positive for carpal tunnel.  He's not having any symptoms presently.  Will monitor.    #3.  Hypertension-[facility] Hypertension Plan    Based on this patient's clinical history and according to [facility] guidelines target BP goal is: less than 140/90  Based on the patient's last BP of BP: 140/76 mmHg the patient is: at goal  The plan to reach goal   1.  Reviewed pt's understanding of medications including any barriers to adherence.   2.  Recommended lifestyle modifications: medication compliance   3.  The patient understands their clinical goals and will undertake self-management recommendations including:all     4.  Medication Management: no changes made    5.  Referral to Care Management:: No   6.  Patient Ed/Self-management tools provided: Current self-management tools adequate    Patient was due for routine physical in June which he'll schedule.

## 2013-02-08 ENCOUNTER — Ambulatory Visit
Admit: 2013-02-08 | Discharge: 2013-02-08 | Disposition: A | Discharge status: Home / Self Care | Payer: Self-pay | Source: Ambulatory Visit | Attending: Oncology | Admitting: Oncology

## 2013-02-08 ENCOUNTER — Ambulatory Visit: Payer: Self-pay | Admitting: Primary Care

## 2013-02-08 ENCOUNTER — Encounter: Payer: Self-pay | Admitting: Primary Care

## 2013-02-08 VITALS — BP 120/76 | HR 78 | Temp 97.8°F | Resp 18 | Ht 75.0 in | Wt 261.0 lb

## 2013-02-08 DIAGNOSIS — C61 Malignant neoplasm of prostate: Secondary | ICD-10-CM

## 2013-02-08 DIAGNOSIS — R202 Paresthesia of skin: Secondary | ICD-10-CM

## 2013-02-08 LAB — COMPREHENSIVE METABOLIC PANEL
ALT: 33 U/L (ref 0–50)
AST: 13 U/L (ref 0–50)
Albumin: 4.6 g/dL (ref 3.5–5.2)
Alk Phos: 75 U/L (ref 40–130)
Anion Gap: 14 (ref 7–16)
Bilirubin,Total: 0.8 mg/dL (ref 0.0–1.2)
CO2: 23 mmol/L (ref 20–28)
Calcium: 8.8 mg/dL (ref 8.6–10.2)
Chloride: 102 mmol/L (ref 96–108)
Creatinine: 0.82 mg/dL (ref 0.67–1.17)
GFR,Black: 107 *
GFR,Caucasian: 93 *
Glucose: 99 mg/dL (ref 60–99)
Lab: 22 mg/dL — ABNORMAL HIGH (ref 6–20)
Potassium: 4.5 mmol/L (ref 3.3–5.1)
Sodium: 139 mmol/L (ref 133–145)
Total Protein: 7.3 g/dL (ref 6.3–7.7)

## 2013-02-08 LAB — CBC AND DIFFERENTIAL
Baso # K/uL: 0 10*3/uL (ref 0.0–0.1)
Basophil %: 0.3 % (ref 0.2–1.2)
Eos # K/uL: 0.1 10*3/uL (ref 0.0–0.5)
Eosinophil %: 1.3 % (ref 0.8–7.0)
Hematocrit: 45 % (ref 40–51)
Hemoglobin: 15.9 g/dL (ref 13.7–17.5)
Lymph # K/uL: 1.9 10*3/uL (ref 1.3–3.6)
Lymphocyte %: 21.1 % — ABNORMAL LOW (ref 21.8–53.1)
MCV: 95 fL — ABNORMAL HIGH (ref 79–92)
Mono # K/uL: 1.1 10*3/uL — ABNORMAL HIGH (ref 0.3–0.8)
Monocyte %: 12 % (ref 5.3–12.2)
Neut # K/uL: 5.9 10*3/uL — ABNORMAL HIGH (ref 1.8–5.4)
Nucl RBC # K/uL: 0 10*3/uL
Nucl RBC %: 0 /100 WBC (ref 0.0–0.2)
Platelets: 288 10*3/uL (ref 150–330)
RBC: 4.8 MIL/uL (ref 4.6–6.1)
RDW: 12.9 % (ref 11.6–14.4)
Seg Neut %: 65.3 % (ref 34.0–67.9)
WBC: 9 10*3/uL (ref 4.2–9.1)

## 2013-02-08 LAB — TESTOSTERONE: Testosterone: 291 ng/dL (ref 193–740)

## 2013-02-08 LAB — LACTATE DEHYDROGENASE: LD: 140 U/L (ref 118–225)

## 2013-02-08 LAB — PSA (EFF.4-2010): PSA (eff. 4-2010): <0.02 ng/mL (ref 0.00–4.00)

## 2013-02-09 ENCOUNTER — Encounter: Payer: Self-pay | Admitting: Primary Care

## 2013-02-10 NOTE — Progress Notes (Signed)
Maxwell Wells presents to our office today for a routine office visit.  He is a very pleasant 67 year old gentleman who presents to our office today with some numbing paresthesias on the left side of his lower abdomen.  He's been going on for about a week and a half.  No rashes.  No abdominal pain.  No fever or chills.  No systemic symptoms.  Of note, is that the patient did have 3 cryotherapy injections for renal cell carcinoma and outside.  His paresthesias seem to start after those injections.    Medications reviewed and updated in EMR  List reflects end of visit medication list.  Problem list updated.  Current Outpatient Prescriptions   Medication Sig Dispense Refill   . TIKOSYN 500 MCG capsule        . metoprolol (TOPROL-XL) 50 MG 24 hr tablet Take 50 mg by mouth daily   Do not crush or chew. May be divided.       . Multiple Vitamin (MULTIVITAMIN) per tablet Take 1 tablet by mouth daily       . atorvastatin (LIPITOR) 10 MG tablet Take 10 mg by mouth daily (with breakfast)          . aspirin 81 MG EC tablet Take 81 mg by mouth daily.         No current facility-administered medications for this visit.     Allergies---  Food  Patient Active Problem List   Diagnosis Code   . Prostate cancer 185   . Renal cell cancer 189.0   . Male Erectile Disorder 302.72   . Arthritis 716.90   . Nephrolithiasis 592.0   . 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 Other and unspecified hyperlipidemia 272.4   . Atrial fibrillation 427.31     Body mass index is 32.62 kg/(m^2).  Blood pressure 120/76, pulse 78, temperature 36.6 C (97.8 F), temperature source Temporal, resp. rate 18, height 1.905 m (6\' 3" ), weight 118.389 kg (261 lb), SpO2 98.00%.    Pleasant gentleman who is moderately obese.  Skin warm and dry with actinic changes.  No rash on his abdomen.  No tenderness.  There is superficial hyperesthesia present.    Superficial hyperesthesia of uncertain etiology.  He does have a dermatomal distribution.  No evidence of shingles.  Could be  related to previous injection therapy for his renal cell carcinoma.  Observe for now.  Consider spinal imaging if symptoms persist or progress.  Patient is advised to let me know.

## 2013-02-18 ENCOUNTER — Encounter: Payer: Self-pay | Admitting: Oncology

## 2013-02-18 ENCOUNTER — Ambulatory Visit: Payer: Self-pay | Admitting: Oncology

## 2013-02-18 VITALS — BP 133/67 | HR 64 | Temp 97.9°F | Resp 16 | Wt 249.1 lb

## 2013-02-18 DIAGNOSIS — C61 Malignant neoplasm of prostate: Secondary | ICD-10-CM

## 2013-02-18 NOTE — Progress Notes (Addendum)
Subjective:      Patient ID: Maxwell Wells is a 67 y.o. male with both prostate and renal cell cancers.     CC: "I had to have my kidney treated again. "    HPI   Maxwell Wells is a 67 year old with prostate cancer and renal cell carcinoma. He is status post laparoscopic radical prostatectomy in December 2004 for Gleason 8/10, P3, NX, MX adenocarcinoma of the prostate. He received radiotherapy in April and May of 2005. He was on ADT for detectable and rising PSA. The ADT was discontinued in August 2007 due to intractable and incapacitating hot flashes. He has been followed without treatment since then. His PSA had remained at the limit of detection. He had a CT scan as part of workup for recurrent kidney stones. It showed a 1.9 x 1.9 cm left renal mass. He underwent a laparoscopic partial nephrectomy in October 2010. Pathology report indicated that the mass had been removed in multiple fragments. 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 Margins could not be assessed and size of the tumor could not be determined. He is being followed without further intervention. He returns for a scheduled follow-up visit but reports that he recently had cryoablation to his L kidney 01/27/13. He tolerated this well except for some mild flank discomfort and numbness since the procedure. He also notes he had bladder calculi that obstructed the bladder/sphincter requiring laser/lithtripsy while in [facility] in March.       Current Outpatient Prescriptions   Medication   . TIKOSYN 500 MCG capsule   . metoprolol (TOPROL-XL) 50 MG 24 hr tablet   . Multiple Vitamin (MULTIVITAMIN) per tablet   . atorvastatin (LIPITOR) 10 MG tablet   . aspirin 81 MG EC tablet     No current facility-administered medications for this visit.       Review of Systems  Constitutional: Negative for fever, chills and fatigue. ECOG 0-1. He has been boating and golfing.    HENT: Negative. Eyes: Negative.   Respiratory: Negative. Negative for cough,  chest tightness and shortness of breath.   Cardiovascular: He has had some prolonged episodes of A fib. He is now on tikosyn with imiproved control. He is now feeling well with minimal symptoms - rarely feels any skipped beats.   Gastrointestinal: Negative. Negative for nausea, abdominal pain, constipation and abdominal distention.   Genitourinary: Negative for dysuria, hematuria. He sees Dr. Manuela Schwartz every 6 months alternating between CT scans and U/S to monitor his previously resected renal cell CA - see above required repeat cryoablation.   Musculoskeletal: Positive for arthralgias (generalized-chronic ostly shoulders).   Skin: Negative.   Neurological: Negative. Negative for weakness, numbness and headaches. He did experience some dizziness felt to be related to labrynthitis that has also improved.   Hematological: Negative. Negative for adenopathy. Does not bruise/bleed easily.   Psychiatric/Behavioral: He is in good spirits and looking forward to his son's wedding this summer.         Objective:     Filed Vitals:    02/18/13 1516   BP: 133/67   Pulse: 64   Temp: 36.6 C (97.9 F)   Resp: 16   Weight: 113 kg (249 lb 1.9 oz)        Ref. Range 02/08/2013 11:35   LD Latest Range: 118-225 U/L 140   Sodium Latest Range: 133-145 mmol/L 139   Potassium Latest Range: 3.3-5.1 mmol/L 4.5   Chloride Latest Range: 96-108 mmol/L 102  CO2 Latest Range: 20-28 mmol/L 23   Anion Gap Latest Range: 7-16  14   UN Latest Range: 6-20 mg/dL 22 (H)   Creatinine Latest Range: 0.67-1.17 mg/dL 9.60   GFR,Black No range found 107   GFR,Caucasian No range found 93   Glucose Latest Range: 60-99 mg/dL 99   Calcium Latest Range: 8.6-10.2 mg/dL 8.8   Total Protein Latest Range: 6.3-7.7 g/dL 7.3   Albumin Latest Range: 3.5-5.2 g/dL 4.6   ALT Latest Range: 0-50 U/L 33   AST Latest Range: 0-50 U/L 13   Alk Phos Latest Range: 40-130 U/L 75   Bilirubin,Total Latest Range: 0.0-1.2 mg/dL 0.8   Testosterone Latest Range: 193-740 ng/dL 454   WBC Latest  Range: 4.2-9.1 THOU/uL 9.0   RBC Latest Range: 4.6-6.1 MIL/uL 4.8   Hemoglobin Latest Range: 13.7-17.5 g/dL 09.8   Hematocrit Latest Range: 40-51 % 45   MCV Latest Range: 79-92 fL 95 (H)   RDW Latest Range: 11.6-14.4 % 12.9   Platelets Latest Range: 150-330 THOU/uL 288   Neut # K/uL Latest Range: 1.8-5.4 THOU/uL 5.9 (H)      02/17/2012 13:20 08/13/2012 15:47 02/08/2013 11:35   PSA  <0.02 <0.02 <0.02       Physical Exam  Constitutional: He is oriented to person, place, and time. He appears well-developed and well-nourished. No distress.   HENT: Head: Normocephalic and atraumatic.   Mouth/Throat: Oropharynx is clear and moist.   Eyes: Pupils are equal, round, and reactive to light. No scleral icterus.   Neck: Normal range of motion. Neck supple.   Cardiovascular: Normal rate, irregular rhythm and normal heart sounds.   Pulmonary/Chest: Effort normal and breath sounds normal. He has no wheezes. He has no rales.   Abdominal: Soft. Bowel sounds are normal. He exhibits no distension and no mass. No tenderness.   No inguinal or femoral nodes.  Musculoskeletal: Normal range of motion. He exhibits no edema and no tenderness.   Lymphadenopathy: He has no cervical or axillary adenopathy.   Neurological: He is alert and oriented to person, place, and time.   Skin: Skin is warm and dry. No rash noted. Very tanned.  Psychiatric: He has a normal mood and affect. His behavior is normal. Judgment and thought content normal.     Assessment:     67 -year-old man with prostate cancer and undetectable PSA. He remains without clinical or biochemical evidence of his prostate cancer. Per the patient Dr Manuela Schwartz continues to follow him closely for his renal cell cancer and will now have Q 3 months CT's again d/t the recent recurrence s/p cryoablation.     Plan:     1. We will continue to monitor him without intervention at this time for the prostate cancer. He is compliant with his urologic follow up with Dr Manuela Schwartz for his recurrent RCC. He will  continue to have imaging directed by his office. We are happy to assist in any way if there are any concerns.     2. He will return in 6-7 months with routine labs and a diagnostic PSA.      3. He knows to call if there are any questions, concerns or new symptoms before that scheduled clinic visit. I encouraged him to be compliant with his normal health maintenance activities. He will follow up with cardiology for his A fib.     Seen with Dr Marylene Land,     Maxwell Wells    I saw and evaluated Mr.  Wells with Ms. Sievert, Wells. I agree with her findings and plan of care as documented above. Undetectable PSA and normal testosterone. Being followed by Dr. Manuela Schwartz for RCC. Agree with the plan of follow up with PSA and testosterone in approximately 6 months.    Ayesha Rumpf Vanita Ingles, MD

## 2013-03-02 ENCOUNTER — Encounter: Payer: Self-pay | Admitting: Primary Care

## 2013-03-09 ENCOUNTER — Ambulatory Visit
Admit: 2013-03-09 | Discharge: 2013-03-09 | Disposition: A | Discharge status: Home / Self Care | Payer: Self-pay | Source: Ambulatory Visit | Attending: Primary Care | Admitting: Primary Care

## 2013-03-09 ENCOUNTER — Ambulatory Visit: Payer: Self-pay | Admitting: Primary Care

## 2013-03-09 ENCOUNTER — Encounter: Payer: Self-pay | Admitting: Primary Care

## 2013-03-09 VITALS — BP 130/82 | HR 64 | Temp 97.6°F | Resp 18 | Ht 74.25 in | Wt 260.0 lb

## 2013-03-09 DIAGNOSIS — Z0000 Encounter for general adult medical examination without abnormal findings: Secondary | ICD-10-CM

## 2013-03-09 LAB — LIPID PANEL
Chol/HDL Ratio: 3.7
Cholesterol: 156 mg/dL
HDL: 42 mg/dL
LDL Calculated: 68 mg/dL
Non HDL Cholesterol: 114 mg/dL
Triglycerides: 231 mg/dL — AB

## 2013-03-09 LAB — PCMH FALL RISK ASSESSMENT

## 2013-03-09 NOTE — H&P (Signed)
Maxwell Wells presents to our office today for a routine physical exam.  He is a complicated 67 year old gentleman who has had prostate cancer, renal cell carcinoma, and intermittent paroxysmal atrial fibrillation.  He is up-to-date with his urologist, oncologist and cardiologist.  He states overall he is feeling well.  He is having some orthopedic issues and has seen Dr. Latrelle Dodrill for this.  He may have to have shoulder surgery soon.  Despite his maladies he is doing quite well.  He is an avid golfer and plays regularly.  He also spends the majority of the winter in [place].    Family history significant for hypertension and hyperlipidemia.    Social history-patient is married and lives with his wife, Maxwell Wells.  He has grown children.  He does not smoke.  Drinks alcohol rarely.  Enjoys golf and travel.    Review of systems is significant for left greater than right shoulder pain.  Also has knee pain.    Medications reviewed and updated in EMR  List reflects end of visit medication list.  Problem list updated.  Current Outpatient Prescriptions   Medication Sig Dispense Refill   . TIKOSYN 500 MCG capsule 500 mcg 2 times daily          . metoprolol (TOPROL-XL) 50 MG 24 hr tablet Take 50 mg by mouth 2 times daily   Do not crush or chew. May be divided.       . Multiple Vitamin (MULTIVITAMIN) per tablet Take 1 tablet by mouth daily       . atorvastatin (LIPITOR) 10 MG tablet Take 20 mg by mouth daily (with breakfast)          . aspirin 81 MG EC tablet Take 81 mg by mouth daily.         No current facility-administered medications for this visit.     Allergies---  Food  Patient Active Problem List   Diagnosis Code   . Prostate cancer 185   . Renal cell cancer 189.0   . Male Erectile Disorder 302.72   . Arthritis 716.90   . Nephrolithiasis 592.0   . 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 Other and unspecified hyperlipidemia 272.4   . Atrial fibrillation 427.31     Body mass index is 33.16 kg/(m^2).  Blood pressure 130/82, pulse 64,  temperature 36.4 C (97.6 F), temperature source Temporal, resp. rate 18, height 1.886 m (6' 2.25"), weight 117.935 kg (260 lb), SpO2 98.00%.    Pleasant obese gentleman.  Skin warm and dry with actinic changes.  No suspicious lesions.  Head and neck exam normal.  No thyromegaly.  No lymphadenopathy.  No carotid bruits.  His lungs are clear without any rales or rhonchi.  Cardiac exam reveals a regular rhythm.  Soft systolic murmur.  No S3 gallop.  His abdomen is obese and nontender.  He has a large ventral hernia.  No masses organomegaly.  Rectal exam is deferred.  Extremities are normal.    EKG deferred.    Laboratory data pending.    67 year old gentleman with multiple medical issues.  He presents to our office today for preventive health visit.  His health maintenance is up to date.  Check lipid profile.  Continue current medication pending labs.  Continue followup with multiple specialists.    Followup this fall for a repeat Pneumovax and flu shot.

## 2013-03-17 ENCOUNTER — Encounter: Payer: Self-pay | Admitting: Gastroenterology

## 2013-04-30 ENCOUNTER — Ambulatory Visit: Payer: Self-pay

## 2013-05-06 ENCOUNTER — Encounter: Payer: Self-pay | Admitting: Gastroenterology

## 2013-05-19 ENCOUNTER — Encounter: Payer: Self-pay | Admitting: Gastroenterology

## 2013-06-01 ENCOUNTER — Encounter: Payer: Self-pay | Admitting: Gastroenterology

## 2013-06-28 ENCOUNTER — Encounter: Payer: Self-pay | Admitting: Gastroenterology

## 2013-07-02 ENCOUNTER — Encounter: Payer: Self-pay | Admitting: Primary Care

## 2013-07-02 ENCOUNTER — Ambulatory Visit: Payer: Self-pay | Admitting: Primary Care

## 2013-07-02 VITALS — BP 110/78 | HR 64 | Temp 97.7°F | Ht 75.0 in | Wt 264.4 lb

## 2013-07-02 DIAGNOSIS — R059 Cough, unspecified: Secondary | ICD-10-CM

## 2013-07-02 MED ORDER — DOXYCYCLINE HYCLATE 100 MG PO TABS *I*
100.0000 mg | ORAL_TABLET | Freq: Two times a day (BID) | ORAL | Status: DC
Start: 2013-07-02 — End: 2013-08-19

## 2013-07-02 NOTE — Progress Notes (Signed)
ZOX:WRUEAVW Mancia is a 67 y.o. male Comes in with 4 weeks of cough and postnasal drip. Phlegm is clear to yellow. He has  postnasal drip but denies any sinus drainage. He is a nonsmoker. Denies any fevers. No fevers or chills.      Patient Active Problem List   Diagnosis Code   . Prostate cancer 185   . Renal cell cancer 189.0   . Male Erectile Disorder 302.72   . Arthritis 716.90   . Nephrolithiasis 592.0   . 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 Other and unspecified hyperlipidemia 272.4   . Atrial fibrillation 427.31       ALLERGIES:  Food    Current Outpatient Prescriptions on File Prior to Visit   Medication Sig Dispense Refill   . TIKOSYN 500 MCG capsule 500 mcg 2 times daily          . metoprolol (TOPROL-XL) 50 MG 24 hr tablet Take 50 mg by mouth 2 times daily   Do not crush or chew. May be divided.       . Multiple Vitamin (MULTIVITAMIN) per tablet Take 1 tablet by mouth daily       . atorvastatin (LIPITOR) 10 MG tablet Take 20 mg by mouth daily (with breakfast)          . aspirin 81 MG EC tablet Take 81 mg by mouth daily.         No current facility-administered medications on file prior to visit.     Medications reviewed, reconciled with patients and changes were made      Exam:  Filed Vitals:    07/02/13 1322   BP: 110/78   Pulse: 64   Temp: 36.5 C (97.7 F)   Height: 1.905 m (6\' 3" )   Weight: 119.931 kg (264 lb 6.4 oz)    Body mass index is 33.05 kg/(m^2).    SpO2 Readings from Last 3 Encounters:   07/02/13 97%   03/09/13 98%   02/18/13 99%     Appears well in no distress.  HEENT: No adenopathy in the anterior-posterior cervical chain.  No clavicular adenopathy.  Oropharynx clear  Neck veins flat.  No thyroid nodules.    Heart: RRR S1-S2 without murmurs  Lungs: Clear to auscultation            1. Cough      Clearing cough for 4 weeks with clear to yellowish phlegm. Possibly could be low-grade mycoplasma infection. Will treat with a course of doxycycline 100 mg twice daily for a week

## 2013-07-13 ENCOUNTER — Encounter: Payer: Self-pay | Admitting: Gastroenterology

## 2013-08-13 ENCOUNTER — Ambulatory Visit
Admit: 2013-08-13 | Discharge: 2013-08-13 | Disposition: A | Discharge status: Home / Self Care | Payer: Self-pay | Source: Ambulatory Visit | Attending: Oncology | Admitting: Oncology

## 2013-08-13 DIAGNOSIS — C61 Malignant neoplasm of prostate: Secondary | ICD-10-CM

## 2013-08-13 LAB — CBC AND DIFFERENTIAL
Baso # K/uL: 0 10*3/uL (ref 0.0–0.1)
Basophil %: 0.5 % (ref 0.2–1.2)
Eos # K/uL: 0.2 10*3/uL (ref 0.0–0.5)
Eosinophil %: 2.6 % (ref 0.8–7.0)
Hematocrit: 45 % (ref 40–51)
Hemoglobin: 15.9 g/dL (ref 13.7–17.5)
Lymph # K/uL: 2 10*3/uL (ref 1.3–3.6)
Lymphocyte %: 26.8 % (ref 21.8–53.1)
MCH: 33 pg/cell — ABNORMAL HIGH (ref 26–32)
MCHC: 35 g/dL (ref 32–37)
MCV: 94 fL — ABNORMAL HIGH (ref 79–92)
Mono # K/uL: 0.8 10*3/uL (ref 0.3–0.8)
Monocyte %: 10.5 % (ref 5.3–12.2)
Neut # K/uL: 4.5 10*3/uL (ref 1.8–5.4)
Nucl RBC # K/uL: 0 10*3/uL
Nucl RBC %: 0 /100 WBC (ref 0.0–0.2)
Platelets: 312 10*3/uL (ref 150–330)
RBC: 4.8 MIL/uL (ref 4.6–6.1)
RDW: 13.2 % (ref 11.6–14.4)
Seg Neut %: 59.6 % (ref 34.0–67.9)
WBC: 7.6 10*3/uL (ref 4.2–9.1)

## 2013-08-13 LAB — COMPREHENSIVE METABOLIC PANEL
ALT: 47 U/L (ref 0–50)
AST: 22 U/L (ref 0–50)
Albumin: 4.3 g/dL (ref 3.5–5.2)
Alk Phos: 81 U/L (ref 40–130)
Anion Gap: 12 (ref 7–16)
Bilirubin,Total: 0.4 mg/dL (ref 0.0–1.2)
CO2: 22 mmol/L (ref 20–28)
Calcium: 8.8 mg/dL (ref 8.6–10.2)
Chloride: 106 mmol/L (ref 96–108)
Creatinine: 1.01 mg/dL (ref 0.67–1.17)
GFR,Black: 89 *
GFR,Caucasian: 77 *
Glucose: 116 mg/dL — ABNORMAL HIGH (ref 60–99)
Lab: 25 mg/dL — ABNORMAL HIGH (ref 6–20)
Potassium: 4.5 mmol/L (ref 3.3–5.1)
Sodium: 140 mmol/L (ref 133–145)
Total Protein: 6.6 g/dL (ref 6.3–7.7)

## 2013-08-13 LAB — TESTOSTERONE: Testosterone: 248 ng/dL (ref 193–740)

## 2013-08-13 LAB — LACTATE DEHYDROGENASE: LD: 132 U/L (ref 118–225)

## 2013-08-13 LAB — PSA (EFF.4-2010): PSA (eff. 4-2010): <0.02 ng/mL (ref 0.00–4.00)

## 2013-08-19 ENCOUNTER — Encounter: Payer: Self-pay | Admitting: Oncology

## 2013-08-19 ENCOUNTER — Ambulatory Visit: Payer: Self-pay | Admitting: Oncology

## 2013-08-19 VITALS — BP 121/60 | HR 65 | Temp 98.6°F | Resp 16 | Ht 75.0 in | Wt 272.6 lb

## 2013-08-19 DIAGNOSIS — C61 Malignant neoplasm of prostate: Secondary | ICD-10-CM

## 2013-11-11 ENCOUNTER — Encounter: Payer: Self-pay | Admitting: Primary Care

## 2013-11-16 ENCOUNTER — Encounter: Payer: Self-pay | Admitting: Primary Care

## 2013-11-16 ENCOUNTER — Ambulatory Visit: Payer: Self-pay | Admitting: Primary Care

## 2013-11-16 VITALS — BP 122/60 | HR 65 | Temp 97.9°F | Ht 75.0 in | Wt 267.0 lb

## 2013-11-16 DIAGNOSIS — I10 Essential (primary) hypertension: Secondary | ICD-10-CM

## 2013-11-16 DIAGNOSIS — I4891 Unspecified atrial fibrillation: Secondary | ICD-10-CM

## 2013-11-16 LAB — PCMH FALL RISK ASSESSMENT

## 2013-11-16 NOTE — Progress Notes (Signed)
Subjective:      Patient ID: Maxwell Wells is a 68 y.o. male with both prostate and renal cell cancers.     CC: c/o R shoulder pain - had rotator cuff surgery 05/13/13. Just starting to increase movement in joint. On e episode of kidney stone in since last visit. Also had 4 weeks of a cough and required antibiotics and now resolved.     HPI  Maxwell Wells is a 68 year old with prostate cancer and renal cell carcinoma. He is status post laparoscopic radical prostatectomy in December 2004 for Gleason 8/10, P3, NX, MX adenocarcinoma of the prostate. He received radiotherapy in April and May of 2005. He was on ADT for detectable and rising PSA. The ADT was discontinued in August 2007 due to intractable and incapacitating hot flashes. He has been followed without treatment since then. His PSA had remained at the limit of detection. He had a CT scan as part of workup for recurrent kidney stones. It showed a 1.9 x 1.9 cm left renal mass. He underwent a laparoscopic partial nephrectomy in October 2010. Pathology report indicated that the mass had been removed in multiple fragments. 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 Margins could not be assessed and size of the tumor could not be determined. He is being followed without further intervention. He returns for a scheduled follow-up visit but reports that he recently had cryoablation to his L kidney 01/27/13. He tolerated this well except for some mild flank discomfort and numbness since the procedure. He also notes he had bladder calculi that obstructed the bladder/sphincter requiring laser/lithtripsy while in [facility] in March.       Current Outpatient Prescriptions   Medication    TIKOSYN 500 MCG capsule    Multiple Vitamin (MULTIVITAMIN) per tablet    aspirin 81 MG EC tablet    atorvastatin (LIPITOR) 20 MG tablet    metoprolol (TOPROL-XL) 100 MG 24 hr tablet    tamsulosin (FLOMAX) 0.4 MG     No current facility-administered medications for this  visit.       Review of Systems  Constitutional: Negative for fever, chills and fatigue. ECOG 1. He has been unable to do much d/t recent surgery.  HENT: Negative. Eyes: Negative.   Respiratory: Negative. Negative for chest tightness and shortness of breath. Cough resolved.  Cardiovascular: He has had some prolonged episodes of A fib in past. He is now on tikosyn with imiproved control. He is now feeling well with minimal symptoms - rarely feels any skipped beats.   Gastrointestinal: Negative. Negative for nausea, abdominal pain, constipation and abdominal distention.   Genitourinary: Negative for dysuria, hematuria. He sees Dr. [name] every 6 months alternating between CT scans and U/S to monitor his previously resected renal cell CA - repeat cryoablation last year.   Musculoskeletal: Positive for arthralgias (generalized-chronic ostly shoulders).   Skin: Negative.   Neurological: Negative. Negative for weakness, numbness and headaches. He did experience some dizziness felt to be related to labrynthitis that has also improved.   Hematological: Negative. Negative for adenopathy. Does not bruise/bleed easily.   Psychiatric/Behavioral: He is in good spirits.        Objective:     Filed Vitals:    08/19/13 1552   BP: 121/60   Pulse: 65   Temp: 37 C (98.6 F)   Resp: 16   Height: 190.5 cm (6' 3")   Weight: 123.651 kg (272 lb 9.6 oz)  Ref. Range 08/13/2013 15:59   LD Latest Range: 118-225 U/L 132   Sodium Latest Range: 133-145 mmol/L 140   Potassium Latest Range: 3.3-5.1 mmol/L 4.5   Chloride Latest Range: 96-108 mmol/L 106   CO2 Latest Range: 20-28 mmol/L 22   Anion Gap Latest Range: 7-16  12   UN Latest Range: 6-20 mg/dL 25 (H)   Creatinine Latest Range: 0.67-1.17 mg/dL 1.01   GFR,Black No range found 89   GFR,Caucasian No range found 77   Glucose Latest Range: 60-99 mg/dL 116 (H)   Calcium Latest Range: 8.6-10.2 mg/dL 8.8   Total Protein Latest Range: 6.3-7.7 g/dL 6.6   Albumin Latest Range: 3.5-5.2 g/dL 4.3    ALT Latest Range: 0-50 U/L 47   AST Latest Range: 0-50 U/L 22   Alk Phos Latest Range: 40-130 U/L 81   Bilirubin,Total Latest Range: 0.0-1.2 mg/dL 0.4   Testosterone Latest Range: 193-740 ng/dL 248   WBC Latest Range: 4.2-9.1 THOU/uL 7.6   RBC Latest Range: 4.6-6.1 MIL/uL 4.8   Hemoglobin Latest Range: 13.7-17.5 g/dL 15.9   Hematocrit Latest Range: 40-51 % 45   MCV Latest Range: 79-92 fL 94 (H)   MCH Latest Range: 26-32 pg/cell 33 (H)   MCHC Latest Range: 32-37 g/dL 35   RDW Latest Range: 11.6-14.4 % 13.2   Platelets Latest Range: 150-330 THOU/uL 312   Neut # K/uL Latest Range: 1.8-5.4 THOU/uL 4.5   Lymph # K/uL Latest Range: 1.3-3.6 THOU/uL 2.0   Mono # K/uL Latest Range: 0.3-0.8 THOU/uL 0.8   Eos # K/uL Latest Range: 0.0-0.5 THOU/uL 0.2   Baso # K/uL Latest Range: 0.0-0.1 THOU/uL 0.0   Nucl RBC # K/uL No range found 0.0   Seg Neut % Latest Range: 34.0-67.9 % 59.6   Lymphocyte % Latest Range: 21.8-53.1 % 26.8   Monocyte % Latest Range: 5.3-12.2 % 10.5   Eosinophil % Latest Range: 0.8-7.0 % 2.6   Basophil % Latest Range: 0.2-1.2 % 0.5   Nucl RBC % Latest Range: 0.0-0.2 /100 WBC 0.0   PSA (eff. 11-2008) Latest Range: 0.00-4.00 ng/mL <0.02           Physical Exam  Constitutional: He is oriented to person, place, and time. He appears well-developed and well-nourished. No distress.   HENT: Head: Normocephalic and atraumatic.   Mouth/Throat: Oropharynx is clear and moist.   Eyes: Pupils are equal, round, and reactive to light. No scleral icterus.   Neck: Normal range of motion. Neck supple.   Cardiovascular: Normal rate, irregular rhythm and normal heart sounds.   Pulmonary/Chest: Effort normal and breath sounds normal. He has no wheezes. He has no rales.   Abdominal: Soft. Bowel sounds are normal. He exhibits no distension and no mass. No tenderness.   No inguinal or femoral nodes.  Musculoskeletal: Normal range of motion except for R arm which he favors. He exhibits no edema and no tenderness.   Lymphadenopathy: He  has no cervical or axillary adenopathy.   Neurological: He is alert and oriented to person, place, and time.   Skin: Skin is warm and dry. No rash noted.   Psychiatric: He has a normal mood and affect. His behavior is normal. Judgment and thought content normal.     Assessment:     58 -year-old man with prostate cancer and undetectable PSA. He remains without clinical or biochemical evidence of his prostate cancer. Per the patient [name] continues to follow him closely for his renal cell cancer and will  now have Q 3 months CT's again d/t the recent recurrence s/p cryoablation.     Plan:     1. We will continue to monitor him without intervention at this time for the prostate cancer. He is compliant with his urologic follow up with [name] for his recurrent RCC. He will continue to have imaging directed by his office. We are happy to assist in any way if there are any concerns.     2. He will return in 6-7 months with routine labs and a diagnostic PSA.      3. He knows to call if there are any questions, concerns or new symptoms before that scheduled clinic visit. I encouraged him to be compliant with his normal health maintenance activities. He will follow up with cardiology for his A fib.     Discussed with Dr Sharalyn Ink,     Tawanna Solo, NP

## 2013-11-17 ENCOUNTER — Telehealth: Payer: Self-pay | Admitting: Primary Care

## 2013-11-17 NOTE — Telephone Encounter (Signed)
Patient needs an insurance referral:     Doctor: Tomma Rakers     NPI: [id number]     Diagnosis: 592.1     Date of Service: 04.07.2015    Insurance Number:    [place] [id number]

## 2013-11-17 NOTE — Telephone Encounter (Signed)
Ref please

## 2013-11-17 NOTE — Telephone Encounter (Signed)
ok 

## 2013-11-18 NOTE — Telephone Encounter (Signed)
Effective 11/16/13; O/U; MR [id number].

## 2013-11-18 NOTE — Progress Notes (Signed)
Very pleasant 68 year old gentleman presents to our office today for a routine office visit.  He has a history of hypertension and atrial fibrillation.  He also has prostate cancer at radial cell carcinoma.  He has felt closely by neurology.  He has had several CT scans recently.  He made this appointment originally because of some intermittent abdominal symptoms.  He has a known ventral hernia which has gotten larger.  He has seen Dr. Amalia Greenhouse for his opinion soon.  No chest pain or shortness of breath.  No headaches.  No dizziness or lightheadedness.    Medications reviewed and updated in EMR  List reflects end of visit medication list.  Problem list updated.  Current Outpatient Prescriptions   Medication Sig Dispense Refill    atorvastatin (LIPITOR) 20 MG tablet         metoprolol (TOPROL-XL) 100 MG 24 hr tablet         tamsulosin (FLOMAX) 0.4 MG         TIKOSYN 500 MCG capsule 500 mcg 2 times daily           Multiple Vitamin (MULTIVITAMIN) per tablet Take 1 tablet by mouth daily        aspirin 81 MG EC tablet Take 81 mg by mouth daily.         No current facility-administered medications for this visit.     Allergies---  Food  Patient Active Problem List   Diagnosis Code    Prostate cancer 185    Renal cell cancer 189.0    Male Erectile Disorder 302.72    Arthritis 716.90    Nephrolithiasis 592.0    Post Prostatectomy V45.89    Other and unspecified hyperlipidemia 272.4    Atrial fibrillation 427.31     Body mass index is 33.37 kg/(m^2).  Blood pressure 122/60, pulse 65, temperature 36.6 C (97.9 F), height 1.905 m (6\' 3" ), weight 121.11 kg (267 lb), SpO2 96 %.    Pleasant gentleman is moderately obese.  Skin is tanned, warm, and dry.   No JVD.  No carotid bruits.  Lungs are clear without any rales or rhonchi.  Cardiac exam reveals an irregular rhythm.  Soft systolic murmur.  No S3 gallop.  Abdomen is soft and nondistended.  He has an obvious large ventral hernia.  He has significant central  obesity.  No edema.    68 year old gentleman with multiple medical issues presents to our office today for routine office visit.  He has a ventral hernia which is symptomatic.  He will be seen general surgery.  Otherwise doing fine.  To schedule physical exam this fall.

## 2013-11-22 ENCOUNTER — Encounter: Payer: Self-pay | Admitting: Primary Care

## 2013-11-22 ENCOUNTER — Ambulatory Visit: Payer: Self-pay | Admitting: Primary Care

## 2013-11-22 VITALS — BP 122/62 | HR 72 | Temp 99.6°F | Ht 75.0 in | Wt 265.0 lb

## 2013-11-22 DIAGNOSIS — R059 Cough, unspecified: Secondary | ICD-10-CM

## 2013-11-24 ENCOUNTER — Telehealth: Payer: Self-pay | Admitting: Primary Care

## 2013-11-24 NOTE — Progress Notes (Signed)
68 year old gentleman with a complex medical history presents to our office today for a routine office visit.  He has had a cough over the last several weeks.  His cough is nonproductive.  His illness started as a simple cold with nasal congestion and clear rhinorrhea.  No discolored phlegm.  No shortness of breath.  No chest pain.  No documented fever.    Medications reviewed and updated in EMR  List reflects end of visit medication list.  Problem list updated.  Current Outpatient Prescriptions   Medication Sig Dispense Refill    atorvastatin (LIPITOR) 20 MG tablet         metoprolol (TOPROL-XL) 100 MG 24 hr tablet         tamsulosin (FLOMAX) 0.4 MG         TIKOSYN 500 MCG capsule 500 mcg 2 times daily           Multiple Vitamin (MULTIVITAMIN) per tablet Take 1 tablet by mouth daily        aspirin 81 MG EC tablet Take 81 mg by mouth daily.         No current facility-administered medications for this visit.     Allergies---  Food  Patient Active Problem List   Diagnosis Code    Prostate cancer 185    Renal cell cancer 189.0    Male Erectile Disorder 302.72    Arthritis 716.90    Nephrolithiasis 592.0    Post Prostatectomy V45.89    Other and unspecified hyperlipidemia 272.4    Atrial fibrillation 427.31     Body mass index is 33.12 kg/(m^2).  Blood pressure 122/62, pulse 72, temperature 37.6 C (99.6 F), height 1.905 m (6\' 3" ), weight 120.203 kg (265 lb), SpO2 97 %.    Pleasant gentleman who is obese.  Skin warm and dry without significant lesions.  Minor nasal congestion.  No lymphadenopathy.  Oropharynx benign.  Lungs clear.  No wheezing.  No rales or rhonchi.    Viral respiratory illness.  For now, symptomatic treatment indicated.  He will call me if his symptoms persist or progress.

## 2013-11-24 NOTE — Telephone Encounter (Signed)
Lillie--I can't do that.  It's up to the surgeon

## 2013-11-24 NOTE — Telephone Encounter (Signed)
[employer] [facility] called and they are requesting a letter stating that the Rotator Cuff surgery that the patient had on 05/13/2013 - was not Worker's Comp. Related, this is needed so that they can bill the patient regular insurance.  Any questions call Stanton Kidney at [phone] and the letter can be faxed to [phone] - attention [facility].

## 2013-12-15 ENCOUNTER — Telehealth: Payer: Self-pay | Admitting: Primary Care

## 2013-12-15 NOTE — Telephone Encounter (Signed)
[facility] billing dept states that the ins says whoever ref pt to dr little has to  Send a letter to them stating this was not workers comp. Please fax letter to [facility] at  [facility] billing  [phone]

## 2014-01-05 NOTE — Telephone Encounter (Signed)
I spoke to DDOB explained all of this and DDOB hand wrote a note to [facility] I have faxed and scanned

## 2014-01-05 NOTE — Telephone Encounter (Signed)
Maxwell Wells from [employer] called about this letter again - The letter needs to come form the original refereeing Dr which you pt called 3/14 for referral to Dr Rex Kras for Hip pain and then 01/13/13 for Left Shoulder pain - both of these insurance referrals were ok 'd by you and pt was not seen - So could you write letter just stating this is NOT workers comp please fax letter to [phone] atten Maxwell Wells any questions see Maxwell Wells -- The reason for all of this is due to the coding at the surgery place was done wrong and they put in workers comp code by mistake this is why letter is needed

## 2014-01-10 ENCOUNTER — Encounter: Payer: Self-pay | Admitting: Gastroenterology

## 2014-02-08 ENCOUNTER — Telehealth: Payer: Self-pay | Admitting: Oncology

## 2014-02-08 ENCOUNTER — Ambulatory Visit: Payer: Self-pay | Admitting: Primary Care

## 2014-02-08 ENCOUNTER — Encounter: Payer: Self-pay | Admitting: Primary Care

## 2014-02-08 ENCOUNTER — Ambulatory Visit
Admit: 2014-02-08 | Discharge: 2014-02-08 | Disposition: A | Discharge status: Home / Self Care | Payer: Self-pay | Source: Ambulatory Visit | Attending: Oncology | Admitting: Oncology

## 2014-02-08 VITALS — BP 108/68 | HR 68 | Temp 97.6°F | Ht 75.0 in | Wt 264.0 lb

## 2014-02-08 DIAGNOSIS — C649 Malignant neoplasm of unspecified kidney, except renal pelvis: Secondary | ICD-10-CM

## 2014-02-08 DIAGNOSIS — C61 Malignant neoplasm of prostate: Secondary | ICD-10-CM

## 2014-02-08 DIAGNOSIS — R109 Unspecified abdominal pain: Secondary | ICD-10-CM

## 2014-02-08 LAB — CBC AND DIFFERENTIAL
Baso # K/uL: 0 10*3/uL (ref 0.0–0.1)
Basophil %: 0.6 % (ref 0.2–1.2)
Eos # K/uL: 0.2 10*3/uL (ref 0.0–0.5)
Eosinophil %: 2.6 % (ref 0.8–7.0)
Hematocrit: 46 % (ref 40–51)
Hemoglobin: 15.9 g/dL (ref 13.7–17.5)
Lymph # K/uL: 1.5 10*3/uL (ref 1.3–3.6)
Lymphocyte %: 24.6 % (ref 21.8–53.1)
MCH: 33 pg/cell — ABNORMAL HIGH (ref 26–32)
MCHC: 34 g/dL (ref 32–37)
MCV: 97 fL — ABNORMAL HIGH (ref 79–92)
Mono # K/uL: 1 10*3/uL — ABNORMAL HIGH (ref 0.3–0.8)
Monocyte %: 15.6 % — ABNORMAL HIGH (ref 5.3–12.2)
Neut # K/uL: 3.5 10*3/uL (ref 1.8–5.4)
Platelets: 253 10*3/uL (ref 150–330)
RBC: 4.8 MIL/uL (ref 4.6–6.1)
RDW: 12.8 % (ref 11.6–14.4)
Seg Neut %: 56.6 % (ref 34.0–67.9)
WBC: 6.2 10*3/uL (ref 4.2–9.1)

## 2014-02-08 LAB — COMPREHENSIVE METABOLIC PANEL
ALT: 28 U/L (ref 0–50)
AST: 18 U/L (ref 0–50)
Albumin: 4.2 g/dL (ref 3.5–5.2)
Alk Phos: 67 U/L (ref 40–130)
Anion Gap: 14 (ref 7–16)
Bilirubin,Total: 0.7 mg/dL (ref 0.0–1.2)
CO2: 23 mmol/L (ref 20–28)
Calcium: 8.5 mg/dL — ABNORMAL LOW (ref 8.6–10.2)
Chloride: 106 mmol/L (ref 96–108)
Creatinine: 0.95 mg/dL (ref 0.67–1.17)
GFR,Black: 96 *
GFR,Caucasian: 83 *
Glucose: 100 mg/dL — ABNORMAL HIGH (ref 60–99)
Lab: 17 mg/dL (ref 6–20)
Potassium: 5 mmol/L (ref 3.3–5.1)
Sodium: 143 mmol/L (ref 133–145)
Total Protein: 6.8 g/dL (ref 6.3–7.7)

## 2014-02-08 LAB — PSA (EFF.4-2010): PSA (eff. 4-2010): <0.02 ng/mL (ref 0.00–4.00)

## 2014-02-08 NOTE — Telephone Encounter (Signed)
Orders placed per last provider note.

## 2014-02-09 NOTE — Progress Notes (Signed)
68 year old gentleman presents to our office today for a routine office visit.  He has some abdominal symptoms which have been on and off over the last several weeks to months.  He complains of some fullness in the rectum.  He has a history of some diverticular disease and has an extensive history of prostate cancer-status post surgery radiation.  Since that time, he has some difficulty in controlling his bowel movements.  He states it is difficult to tell whether he has to urinate or move his bowels.  No fever or chills.  No urinary symptoms.    Medications reviewed and updated in EMR  List reflects end of visit medication list.  Problem list updated.  Current Outpatient Prescriptions   Medication Sig Dispense Refill    atorvastatin (LIPITOR) 20 MG tablet         metoprolol (TOPROL-XL) 100 MG 24 hr tablet         tamsulosin (FLOMAX) 0.4 MG         TIKOSYN 500 MCG capsule 500 mcg 2 times daily           Multiple Vitamin (MULTIVITAMIN) per tablet Take 1 tablet by mouth daily        aspirin 81 MG EC tablet Take 81 mg by mouth daily.         No current facility-administered medications for this visit.     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  Blood pressure 108/68, pulse 68, temperature 36.4 C (97.6 F), temperature source Temporal, height 1.905 m (6\' 3" ), weight 119.75 kg (264 lb), SpO2 96 %.  Pleasant gentleman in no apparent distress  Skin warm and dry.  Positive actinic changes.  No CVA tenderness.  His abdomen is soft and nontender.  No masses or organomegaly.  Rectal exam is normal.    Rectal pressure in a 68 year old gentleman with extensive pelvic surgery radiation.  He has had multiple CT scans recently  which are negative.  Increase dietary fiber.  Maintain adequate hydration.  He is up-to-date with his colonoscopy but would consider colorectal evaluation if symptoms persist.

## 2014-02-14 ENCOUNTER — Ambulatory Visit: Payer: Self-pay | Admitting: Primary Care

## 2014-02-14 ENCOUNTER — Encounter: Payer: Self-pay | Admitting: Gastroenterology

## 2014-02-17 ENCOUNTER — Ambulatory Visit: Payer: Self-pay | Admitting: Oncology

## 2014-02-17 ENCOUNTER — Encounter: Payer: Self-pay | Admitting: Oncology

## 2014-02-17 VITALS — BP 134/68 | HR 60 | Temp 98.6°F | Resp 16 | Wt 265.5 lb

## 2014-02-17 DIAGNOSIS — C61 Malignant neoplasm of prostate: Secondary | ICD-10-CM

## 2014-02-17 NOTE — Patient Instructions (Signed)
LABS IN [place] FOR 02/16/2015@ 3:30PM

## 2014-04-25 ENCOUNTER — Other Ambulatory Visit: Payer: Self-pay | Admitting: Urology

## 2014-04-26 LAB — AEROBIC CULTURE: Aerobic Culture: NO GROWTH

## 2014-04-28 LAB — MEDICAL CYTOLOGY

## 2014-05-02 ENCOUNTER — Encounter: Payer: Self-pay | Admitting: Gastroenterology

## 2014-05-12 ENCOUNTER — Ambulatory Visit: Payer: Self-pay

## 2014-07-12 ENCOUNTER — Encounter: Payer: Self-pay | Admitting: Gastroenterology

## 2014-08-01 ENCOUNTER — Ambulatory Visit: Payer: Self-pay | Admitting: Internal Medicine

## 2014-08-01 ENCOUNTER — Other Ambulatory Visit: Payer: Self-pay | Admitting: Primary Care

## 2014-08-01 ENCOUNTER — Ambulatory Visit
Admit: 2014-08-01 | Discharge: 2014-08-01 | Disposition: A | Discharge status: Home / Self Care | Payer: Self-pay | Source: Ambulatory Visit | Attending: Oncology | Admitting: Oncology

## 2014-08-01 ENCOUNTER — Telehealth: Payer: Self-pay | Admitting: Primary Care

## 2014-08-01 ENCOUNTER — Encounter: Payer: Self-pay | Admitting: Internal Medicine

## 2014-08-01 VITALS — BP 130/76 | HR 80 | Temp 97.6°F | Resp 18 | Ht 75.0 in | Wt 263.0 lb

## 2014-08-01 DIAGNOSIS — C649 Malignant neoplasm of unspecified kidney, except renal pelvis: Secondary | ICD-10-CM

## 2014-08-01 DIAGNOSIS — C61 Malignant neoplasm of prostate: Secondary | ICD-10-CM

## 2014-08-01 DIAGNOSIS — Z8719 Personal history of other diseases of the digestive system: Secondary | ICD-10-CM

## 2014-08-01 DIAGNOSIS — R1032 Left lower quadrant pain: Secondary | ICD-10-CM

## 2014-08-01 DIAGNOSIS — Z87442 Personal history of urinary calculi: Secondary | ICD-10-CM

## 2014-08-01 LAB — COMPREHENSIVE METABOLIC PANEL
ALT: 35 U/L (ref 0–50)
AST: 17 U/L (ref 0–50)
Albumin: 4.5 g/dL (ref 3.5–5.2)
Alk Phos: 68 U/L (ref 40–130)
Anion Gap: 13 (ref 7–16)
Bilirubin,Total: 0.7 mg/dL (ref 0.0–1.2)
CO2: 24 mmol/L (ref 20–28)
Calcium: 8.7 mg/dL (ref 8.6–10.2)
Chloride: 104 mmol/L (ref 96–108)
Creatinine: 0.89 mg/dL (ref 0.67–1.17)
GFR,Black: 103 *
GFR,Caucasian: 89 *
Glucose: 115 mg/dL — ABNORMAL HIGH (ref 60–99)
Lab: 22 mg/dL — ABNORMAL HIGH (ref 6–20)
Potassium: 4.9 mmol/L (ref 3.3–5.1)
Sodium: 141 mmol/L (ref 133–145)
Total Protein: 7.1 g/dL (ref 6.3–7.7)

## 2014-08-01 LAB — CBC AND DIFFERENTIAL
Baso # K/uL: 0.1 10*3/uL (ref 0.0–0.1)
Basophil %: 0.8 %
Eos # K/uL: 0.1 10*3/uL (ref 0.0–0.5)
Eosinophil %: 2.4 %
Hematocrit: 44 % (ref 40–51)
Hemoglobin: 15.5 g/dL (ref 13.7–17.5)
IMM Granulocytes #: 0 10*3/uL (ref 0.0–0.1)
IMM Granulocytes: 0.5 %
Lymph # K/uL: 1.3 10*3/uL (ref 1.3–3.6)
Lymphocyte %: 22.2 %
MCH: 34 pg/cell — ABNORMAL HIGH (ref 26–32)
MCHC: 35 g/dL (ref 32–37)
MCV: 97 fL — ABNORMAL HIGH (ref 79–92)
Mono # K/uL: 0.7 10*3/uL (ref 0.3–0.8)
Monocyte %: 12.3 %
Neut # K/uL: 3.7 10*3/uL (ref 1.8–5.4)
Nucl RBC # K/uL: 0 10*3/uL (ref 0.0–0.0)
Nucl RBC %: 0 /100 WBC (ref 0.0–0.2)
Platelets: 269 10*3/uL (ref 150–330)
RBC: 4.6 MIL/uL (ref 4.6–6.1)
RDW: 12.5 % (ref 11.6–14.4)
Seg Neut %: 61.8 %
WBC: 5.9 10*3/uL (ref 4.2–9.1)

## 2014-08-01 LAB — URINALYSIS WITH MICROSCOPIC
Blood,UA: NEGATIVE
Ketones, UA: NEGATIVE
Leuk Esterase,UA: NEGATIVE
Nitrite,UA: NEGATIVE
Protein,UA: NEGATIVE mg/dL
RBC,UA: 2 /hpf (ref 0–2)
Specific Gravity,UA: 1.021 (ref 1.002–1.030)
WBC,UA: 4 /hpf (ref 0–5)
pH,UA: 6 (ref 5.0–8.0)

## 2014-08-01 LAB — TESTOSTERONE: Testosterone: 259 ng/dL (ref 193–740)

## 2014-08-01 LAB — MULTIPLE ORDERING DOCS

## 2014-08-01 LAB — PSA (EFF.4-2010): PSA (eff. 4-2010): <0.02 ng/mL (ref 0.00–4.00)

## 2014-08-01 NOTE — Telephone Encounter (Signed)
Here is the Auth for the [facility]  [id number]  Please make appt let pt know please document the following   Place, Time, Does pt need labs , does pt need to be NPO also please put the name of the person you spoke to when making appt.   When you are done just close encounter .Marland Kitchen   Thanks Maudie Mercury

## 2014-08-01 NOTE — Telephone Encounter (Signed)
Prior authorization needs to be changed to abdominal and pelvic CT with contrast in order to check for diverticulosis

## 2014-08-01 NOTE — Progress Notes (Signed)
Subjective: Patient tells me that on Friday evening he developed left lower quadrant pain which is persisting.  He recalls that he had been home since prior to that, but has not usually had a problem.  He is continuing with 2-8/10 constant left lower quadrant abdominal pain.  It is exacerbated with bending over and with pressure applied to the area.  It is not affected by eating or having a bowel movement.  He has taken some Advil with improvement.    Denies any pyrosis, reflux, nausea, vomiting, change in appetite, back pain or fever.  He normally has a formed bowel movement twice a day without any rectal bleeding, hematochezia or melena.  He denies any recent change in bowel pattern.  Denies any urinary symptoms.  Denies any testicular swelling, redness or pain.    He tells me he has history of 100s of kidney stones on the right but never had one on the left.  The pain feels " vaguely similar", however he tells me he isn't having the typical urinary symptoms.  He did have a history of kidney cancer with treatment on the left kidney.  He does have an artificial bladder and placed in the right lower abdomen.    He did have a colonoscopy 03/16/12 which did reveal diverticulosis.  Family history of colon cancer.    Current Outpatient Prescriptions on File Prior to Visit   Medication Sig Dispense Refill    psyllium (METAMUCIL SMOOTH TEXTURE) 28 % packet Take 1 packet by mouth 2 times daily      atorvastatin (LIPITOR) 20 MG tablet       metoprolol (TOPROL-XL) 100 MG 24 hr tablet Take 50 mg by mouth daily         TIKOSYN 500 MCG capsule 500 mcg 2 times daily         Multiple Vitamin (MULTIVITAMIN) per tablet Take 1 tablet by mouth daily      aspirin 81 MG EC tablet Take 81 mg by mouth daily.       No current facility-administered medications on file prior to visit.     Medications reviewed, no changes made.      Objective: Patient is alert, oriented x3, in no acute distress. Ambulatory.  Blood pressure 130/76, pulse  80, temperature 36.4 C (97.6 F), temperature source Temporal, resp. rate 18, height 1.905 m (6\' 3" ), weight 119.296 kg (263 lb), SpO2 99 %.    Skin warm and dry without rash or jaundice.    Eyes anicteric, conjunctiva clear.     CN two through 12 grossly intact.     No lymphadenopathy.    Respirations easy.  Lungs clear with good airflow.    Heart RRR S1, S2 without murmur noted.    Abdomen soft, round, positive bowel sounds.  He had significant tenderness in his left lower quadrant slightly towards the central lower quadrant.  No rebound or guarding, however he does grimace with palpation. No mass or HSM.    Palpable artificial bladder in the right lower quadrant.  Negative CVA tenderness.    No extremity edema. Full range of motion.  Good radial and pedal pulses.     Assessment and plan: 68 year old male with history of diverticulosis on prior colonoscopy, history of frequent right-sided kidney stones as well as kidney cancer presents with acute left lower quadrant abdominal pain.  He will have CT of the abdomen/pelvis today, 3:30 PM was the soonest appointment.  Evaluating for diverticulitis as well as possible kidney  stones.  Will check SMA 14, CBC with differential and full urine evaluation.  He will remain n.p.o. until after we've discussed the results.    Report received - no diverticulosis. Spoke to radiologist.  Rt side stone and hydronephrosis.  Results reviewed with pt.  Some labs pending.  Instructed him to call DD tomorrow to review labs, full CT and his sx.  He will call Tomma Rakers his urologist.  Also call if worsening.

## 2014-08-01 NOTE — Telephone Encounter (Signed)
Pt scheduled for Abd C/T today at 3: 30

## 2014-08-01 NOTE — Telephone Encounter (Signed)
I thought this is what was supposed to be and I am working on this

## 2014-08-01 NOTE — Telephone Encounter (Signed)
Ref Maxwell Wells

## 2014-08-01 NOTE — Telephone Encounter (Signed)
The new auth for CT pel with is [id number] [facility] notified

## 2014-08-02 ENCOUNTER — Telehealth: Payer: Self-pay | Admitting: Primary Care

## 2014-08-02 LAB — AEROBIC CULTURE: Aerobic Culture: 0

## 2014-08-02 NOTE — Telephone Encounter (Signed)
Patient is calling for recent CT Scan results. Please call patient with results.

## 2014-08-03 NOTE — Telephone Encounter (Signed)
faxed

## 2014-08-03 NOTE — Telephone Encounter (Signed)
Please fax CT report to Dr. Tomma Rakers. ASAP.    (reviewed results with pt. He continues with pain, no change. Plan - he will call urology for their opinion.)

## 2014-08-18 ENCOUNTER — Other Ambulatory Visit: Payer: Self-pay | Admitting: Urology

## 2014-08-18 ENCOUNTER — Encounter: Payer: Self-pay | Admitting: Gastroenterology

## 2014-08-19 LAB — AEROBIC CULTURE: Aerobic Culture: NO GROWTH

## 2014-09-20 ENCOUNTER — Telehealth: Payer: Self-pay | Admitting: Primary Care

## 2014-09-20 NOTE — Telephone Encounter (Signed)
Pt called, he states he has a left ear infection, went to the ED in [place].  RX: Augmentin, on 3rd day with no improvement.  He states it is very comforatable. He cannot hear and  is concerned he will lose his hearing. Pt is requesting a call back from Dr. Keturah Barre to discuss.  Please call pt back.

## 2014-11-17 ENCOUNTER — Encounter: Payer: Self-pay | Admitting: Gastroenterology

## 2014-12-26 ENCOUNTER — Other Ambulatory Visit: Payer: Self-pay

## 2014-12-26 ENCOUNTER — Encounter: Payer: Self-pay | Admitting: Gastroenterology

## 2014-12-26 ENCOUNTER — Telehealth: Payer: Self-pay | Admitting: Primary Care

## 2014-12-26 LAB — BASIC METABOLIC PANEL
Anion Gap: 10 mEq/L (ref 4–16)
CO2: 25 mEq/L (ref 20–31)
Calcium: 9 mg/dL (ref 8.5–10.4)
Chloride: 107 mEq/L (ref 98–108)
Creatinine: 1.1 mg/dL (ref 0.7–1.2)
Glucose: 114 mg/dL — ABNORMAL HIGH (ref 65–100)
Lab: 24 mg/dL — ABNORMAL HIGH (ref 8–20)
Potassium: 4.1 mEq/L (ref 3.5–5.1)
Sodium: 142 mEq/L (ref 135–145)

## 2014-12-26 LAB — LIPID PANEL
Chol/HDL Ratio: 4.2
Cholesterol: 158 mg/dL (ref 100–200)
HDL: 38 mg/dL (ref 35–130)
LDL Calculated: 76 mg/dL (ref 30–100)
Non HDL Cholesterol: 120 mg/dL (ref 95–160)
Triglycerides: 218 mg/dL — ABNORMAL HIGH (ref 30–150)

## 2014-12-26 LAB — CBC AND DIFFERENTIAL
Baso # K/uL: 0 10*3/uL (ref 0.0–0.2)
Basophil %: 1 % (ref 0–3)
Eos # K/uL: 0.2 10*3/uL (ref 0.0–0.6)
Eosinophil %: 3 % (ref 0–5)
Hematocrit: 41 % (ref 40–52)
Hemoglobin: 14.7 g/dL (ref 13.0–18.0)
Lymph # K/uL: 1.7 10*3/uL (ref 1.0–4.8)
Lymphocyte %: 20 % (ref 15–45)
MCH: 33.6 pg (ref 26.0–34.0)
MCHC: 35.8 g/dL (ref 32.0–37.5)
MCV: 94 fL (ref 80–100)
Mono # K/uL: 1 10*3/uL (ref 0.1–1.0)
Monocyte %: 11 % (ref 0–15)
Neut # K/uL: 5.9 10*3/uL (ref 1.8–8.0)
Platelets: 226 10*3/uL (ref 150–450)
RBC: 4.38 10*6/uL — ABNORMAL LOW (ref 4.40–6.20)
RDW: 12.7 % (ref 0.0–15.2)
Seg Neut %: 66 % (ref 45–75)
WBC: 8.8 10*3/uL (ref 4.0–11.0)

## 2014-12-26 LAB — HEMOGLOBIN A1C
Est Avg Glucose: 105 mg/dL
Hemoglobin A1C: 5.3 % (ref 4.0–6.0)

## 2014-12-26 LAB — TROPONIN T: Troponin T: <0.01 ng/mL (ref 0.00–0.09)

## 2014-12-26 LAB — PROTIME-INR
INR: 1 (ref 0.8–1.1)
Protime: 10.2 s (ref 9.0–12.0)

## 2014-12-26 LAB — ESTIMATED GFR
GFR,Black: >59 mL/min
GFR,Caucasian: >59 mL/min

## 2014-12-26 LAB — TSH: TSH: 3.29 u[IU]/mL (ref 0.55–4.78)

## 2014-12-26 LAB — APTT: aPTT: 26.8 s (ref 22.0–34.0)

## 2014-12-26 NOTE — Telephone Encounter (Signed)
Patient's wife called reporting that patient had sudden hearing loss after a loud pop in his head. Could not hear out of let ear and was dizzy. She had called 911 already and was waiting for the ambulance to go to the hospital.

## 2014-12-27 ENCOUNTER — Telehealth: Payer: Self-pay | Admitting: Primary Care

## 2014-12-27 LAB — CBC
Hematocrit: 42 % (ref 40–52)
Hemoglobin: 14.3 g/dL (ref 13.0–18.0)
MCH: 33.1 pg (ref 26.0–34.0)
MCHC: 34.4 g/dL (ref 32.0–37.5)
MCV: 96 fL (ref 80–100)
Platelets: 257 10*3/uL (ref 150–450)
RBC: 4.32 10*6/uL — ABNORMAL LOW (ref 4.40–6.20)
RDW: 12.7 % (ref 0.0–15.2)
WBC: 9.3 10*3/uL (ref 4.0–11.0)

## 2014-12-27 LAB — BASIC METABOLIC PANEL
Anion Gap: 7 mEq/L (ref 4–16)
CO2: 27 mEq/L (ref 20–31)
Calcium: 9.2 mg/dL (ref 8.5–10.4)
Chloride: 106 mEq/L (ref 98–108)
Creatinine: 0.8 mg/dL (ref 0.7–1.2)
Glucose: 137 mg/dL — ABNORMAL HIGH (ref 65–100)
Lab: 18 mg/dL (ref 8–20)
Potassium: 4.6 mEq/L (ref 3.5–5.1)
Sodium: 140 mEq/L (ref 135–145)

## 2014-12-27 LAB — LIPID PANEL
Chol/HDL Ratio: 3.9
Cholesterol: 169 mg/dL (ref 100–200)
HDL: 43 mg/dL (ref 35–130)
LDL Calculated: 95 mg/dL (ref 30–100)
Non HDL Cholesterol: 126 mg/dL (ref 95–160)
Triglycerides: 154 mg/dL — ABNORMAL HIGH (ref 30–150)

## 2014-12-27 LAB — ESTIMATED GFR
GFR,Black: >59 mL/min
GFR,Caucasian: >59 mL/min

## 2014-12-27 LAB — TSH: TSH: 0.7 u[IU]/mL (ref 0.55–4.78)

## 2014-12-27 NOTE — Telephone Encounter (Signed)
Called NA 

## 2014-12-27 NOTE — Telephone Encounter (Signed)
Lattie Haw called to make discharge appt 1 week out the only thing she new about why pt was in ED was the DX Labyrinthitis of left ear . Attending would like a call back about discharge Dr Yancey Flemings [phone]

## 2014-12-30 ENCOUNTER — Telehealth: Payer: Self-pay | Admitting: Primary Care

## 2014-12-30 NOTE — Telephone Encounter (Signed)
For Monday:    Patient needs an insurance referral:     Doctor: Dr. Pilar Plate Salmone-Otolaryngologist    TKT:[id number]   Diagnosis:H83.01     Date of Service:01/02/15     Insurance Number:[id number]

## 2015-01-02 NOTE — Telephone Encounter (Signed)
ok 

## 2015-01-02 NOTE — Telephone Encounter (Addendum)
[facility] referral effective 01/02/15 through 09/27/2017; [id number].

## 2015-01-02 NOTE — Telephone Encounter (Signed)
Ref please

## 2015-01-03 ENCOUNTER — Ambulatory Visit: Payer: Self-pay | Admitting: Primary Care

## 2015-01-03 ENCOUNTER — Encounter: Payer: Self-pay | Admitting: Primary Care

## 2015-01-03 VITALS — BP 124/66 | HR 68 | Temp 97.3°F | Ht 75.0 in | Wt 258.0 lb

## 2015-01-03 DIAGNOSIS — G43109 Migraine with aura, not intractable, without status migrainosus: Secondary | ICD-10-CM

## 2015-01-03 DIAGNOSIS — R42 Dizziness and giddiness: Secondary | ICD-10-CM

## 2015-01-03 DIAGNOSIS — H9192 Unspecified hearing loss, left ear: Secondary | ICD-10-CM

## 2015-01-03 LAB — PCMH FALL RISK ASSESSMENT

## 2015-01-03 NOTE — Progress Notes (Signed)
Very pleasant 69 year old gentleman presents to our office today for a routine office visit.  He was recently hospitalized [facility] after the sudden onset of hearing loss on his left side and vertigo.  He was hospitalized at the stroke unit where he had a thorough workup.  No evidence for CVA.  It was thought that his vertigo is peripheral in his hearing loss secondary to nerve damage.  Currently, he has no hearing in the left ear.  His dizziness and balance have improved.  No falls.  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  He has no headaches.  Does have visual scotomata at times.    Medications reviewed and updated in EMR  List reflects end of visit medication list.  Problem list updated.  Current Outpatient Prescriptions   Medication Sig Dispense Refill    psyllium (METAMUCIL SMOOTH TEXTURE) 28 % packet Take 1 packet by mouth 2 times daily      atorvastatin (LIPITOR) 20 MG tablet       metoprolol (TOPROL-XL) 100 MG 24 hr tablet Take 50 mg by mouth daily         TIKOSYN 500 MCG capsule 500 mcg 2 times daily         Multiple Vitamin (MULTIVITAMIN) per tablet Take 1 tablet by mouth daily      aspirin 81 MG EC tablet Take 81 mg by mouth daily.       No current facility-administered medications for this visit.     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  Blood pressure 124/66, pulse 68, temperature 36.3 C (97.3 F), height 1.905 m (6\' 3" ), weight 117.028 kg (258 lb), SpO2 97 %.    .  Skin warm and dry.  Head and neck exam normal.  No carotid bruits.  No lymphadenopathy.  Lungs are clear.  Cardiac exam reveals a regular rhythm.  No murmur rub or gallop.  No edema.    Patient  presents to our office today for a routine office visit.  This is a hospital follow-up visit.  He has already been seen by ENT.  He has unilateral hearing loss and vertigo.  Imaging negative.  No tinnitus.  Continue observation.  He has been prescribed prednisone by ENT.  I'll plan to see him in the fall for a physical.

## 2015-01-12 ENCOUNTER — Telehealth: Payer: Self-pay | Admitting: Primary Care

## 2015-01-12 NOTE — Telephone Encounter (Signed)
Wife dropped off a note regarding the letter that they need (cancelation of trip) - she wants you to E-mail it to her.  The note in your mail box

## 2015-01-17 ENCOUNTER — Encounter: Payer: Self-pay | Admitting: Gastroenterology

## 2015-02-06 ENCOUNTER — Encounter: Payer: Self-pay | Admitting: Gastroenterology

## 2015-02-14 ENCOUNTER — Ambulatory Visit
Admit: 2015-02-14 | Discharge: 2015-02-14 | Disposition: A | Discharge status: Home / Self Care | Payer: Self-pay | Source: Ambulatory Visit | Attending: Oncology | Admitting: Oncology

## 2015-02-14 DIAGNOSIS — C61 Malignant neoplasm of prostate: Secondary | ICD-10-CM

## 2015-02-14 LAB — COMPREHENSIVE METABOLIC PANEL
ALT: 32 U/L (ref 0–50)
AST: 18 U/L (ref 0–50)
Albumin: 3.9 g/dL (ref 3.5–5.2)
Alk Phos: 75 U/L (ref 40–130)
Anion Gap: 17 — ABNORMAL HIGH (ref 7–16)
Bilirubin,Total: 0.7 mg/dL (ref 0.0–1.2)
CO2: 21 mmol/L (ref 20–28)
Calcium: 8.7 mg/dL (ref 8.6–10.2)
Chloride: 106 mmol/L (ref 96–108)
Creatinine: 0.93 mg/dL (ref 0.67–1.17)
GFR,Black: 98 *
GFR,Caucasian: 85 *
Glucose: 116 mg/dL — ABNORMAL HIGH (ref 60–99)
Lab: 20 mg/dL (ref 6–20)
Potassium: 4.8 mmol/L (ref 3.3–5.1)
Sodium: 144 mmol/L (ref 133–145)
Total Protein: 6.4 g/dL (ref 6.3–7.7)

## 2015-02-14 LAB — CBC AND DIFFERENTIAL
Baso # K/uL: 0.1 10*3/uL (ref 0.0–0.1)
Basophil %: 0.8 %
Eos # K/uL: 0.2 10*3/uL (ref 0.0–0.5)
Eosinophil %: 3.2 %
Hematocrit: 43 % (ref 40–51)
Hemoglobin: 15 g/dL (ref 13.7–17.5)
IMM Granulocytes #: 0.1 10*3/uL (ref 0.0–0.1)
IMM Granulocytes: 0.7 %
Lymph # K/uL: 1.8 10*3/uL (ref 1.3–3.6)
Lymphocyte %: 24.6 %
MCH: 33 pg/cell — ABNORMAL HIGH (ref 26–32)
MCHC: 35 g/dL (ref 32–37)
MCV: 94 fL — ABNORMAL HIGH (ref 79–92)
Mono # K/uL: 0.9 10*3/uL — ABNORMAL HIGH (ref 0.3–0.8)
Monocyte %: 12.6 %
Neut # K/uL: 4.2 10*3/uL (ref 1.8–5.4)
Nucl RBC # K/uL: 0 10*3/uL (ref 0.0–0.0)
Nucl RBC %: 0 /100 WBC (ref 0.0–0.2)
Platelets: 275 10*3/uL (ref 150–330)
RBC: 4.5 MIL/uL — ABNORMAL LOW (ref 4.6–6.1)
RDW: 13.1 % (ref 11.6–14.4)
Seg Neut %: 58.1 %
WBC: 7.3 10*3/uL (ref 4.2–9.1)

## 2015-02-14 LAB — TESTOSTERONE: Testosterone: 312 ng/dL (ref 193–740)

## 2015-02-14 LAB — NEUTROPHIL #-INSTRUMENT: Neutrophil #-Instrument: 4.2 10*3/uL

## 2015-02-14 LAB — PSA (EFF.4-2010): PSA (eff. 4-2010): <0.02 ng/mL (ref 0.00–4.00)

## 2015-02-16 ENCOUNTER — Encounter: Payer: Self-pay | Admitting: Oncology

## 2015-02-16 ENCOUNTER — Ambulatory Visit: Payer: Self-pay | Admitting: Oncology

## 2015-02-16 VITALS — BP 142/68 | HR 61 | Temp 98.2°F | Resp 18 | Ht 75.0 in | Wt 257.0 lb

## 2015-02-16 DIAGNOSIS — C61 Malignant neoplasm of prostate: Secondary | ICD-10-CM

## 2015-03-28 ENCOUNTER — Telehealth: Payer: Self-pay | Admitting: Primary Care

## 2015-03-28 NOTE — Telephone Encounter (Signed)
Ref please

## 2015-03-28 NOTE — Telephone Encounter (Signed)
Referral # [id number] is effective 03/28/15; open ended with unlimited visits. Morey Hummingbird at [employer] was given referral #.

## 2015-03-28 NOTE — Telephone Encounter (Signed)
ok 

## 2015-03-28 NOTE — Telephone Encounter (Signed)
Patient needs an insurance referral:     Doctor: Junita Push     GBM:[id number]     Diagnosis: D18.1    Date of Service:04/04/15   Insurance Number: [id number]      Call back with referral to [phone].

## 2015-05-01 ENCOUNTER — Encounter: Payer: Self-pay | Admitting: Primary Care

## 2015-05-01 ENCOUNTER — Ambulatory Visit
Admission: RE | Admit: 2015-05-01 | Discharge: 2015-05-01 | Disposition: A | Discharge status: Home / Self Care | Payer: Self-pay | Source: Ambulatory Visit | Attending: Primary Care | Admitting: Primary Care

## 2015-05-01 ENCOUNTER — Ambulatory Visit: Payer: Self-pay | Admitting: Primary Care

## 2015-05-01 VITALS — BP 130/68 | HR 61 | Temp 97.9°F | Ht 75.0 in | Wt 253.0 lb

## 2015-05-01 DIAGNOSIS — Z87442 Personal history of urinary calculi: Secondary | ICD-10-CM

## 2015-05-01 DIAGNOSIS — C649 Malignant neoplasm of unspecified kidney, except renal pelvis: Secondary | ICD-10-CM

## 2015-05-01 DIAGNOSIS — R319 Hematuria, unspecified: Secondary | ICD-10-CM

## 2015-05-01 DIAGNOSIS — R1032 Left lower quadrant pain: Secondary | ICD-10-CM

## 2015-05-01 LAB — CBC AND DIFFERENTIAL
Baso # K/uL: 0 10*3/uL (ref 0.0–0.1)
Basophil %: 0.5 %
Eos # K/uL: 0.1 10*3/uL (ref 0.0–0.5)
Eosinophil %: 1.6 %
Hematocrit: 41 % (ref 40–51)
Hemoglobin: 14.1 g/dL (ref 13.7–17.5)
IMM Granulocytes #: 0 10*3/uL (ref 0.0–0.1)
IMM Granulocytes: 0.2 %
Lymph # K/uL: 1.5 10*3/uL (ref 1.3–3.6)
Lymphocyte %: 25 %
MCH: 34 pg/cell — ABNORMAL HIGH (ref 26–32)
MCHC: 35 g/dL (ref 32–37)
MCV: 98 fL — ABNORMAL HIGH (ref 79–92)
Mono # K/uL: 0.8 10*3/uL (ref 0.3–0.8)
Monocyte %: 13.6 %
Neut # K/uL: 3.6 10*3/uL (ref 1.8–5.4)
Nucl RBC # K/uL: 0 10*3/uL (ref 0.0–0.0)
Nucl RBC %: 0 /100 WBC (ref 0.0–0.2)
Platelets: 264 10*3/uL (ref 150–330)
RBC: 4.1 MIL/uL — ABNORMAL LOW (ref 4.6–6.1)
RDW: 12.6 % (ref 11.6–14.4)
Seg Neut %: 59.1 %
WBC: 6.1 10*3/uL (ref 4.2–9.1)

## 2015-05-01 LAB — COMPREHENSIVE METABOLIC PANEL
ALT: 30 U/L (ref 0–50)
AST: 15 U/L (ref 0–50)
Albumin: 4.3 g/dL (ref 3.5–5.2)
Alk Phos: 62 U/L (ref 40–130)
Anion Gap: 13 (ref 7–16)
Bilirubin,Total: 0.6 mg/dL (ref 0.0–1.2)
CO2: 25 mmol/L (ref 20–28)
Calcium: 9.1 mg/dL (ref 8.6–10.2)
Chloride: 103 mmol/L (ref 96–108)
Creatinine: 0.9 mg/dL (ref 0.67–1.17)
GFR,Black: 102 *
GFR,Caucasian: 88 *
Glucose: 106 mg/dL — ABNORMAL HIGH (ref 60–99)
Lab: 24 mg/dL — ABNORMAL HIGH (ref 6–20)
Potassium: 4.6 mmol/L (ref 3.3–5.1)
Sodium: 141 mmol/L (ref 133–145)
Total Protein: 6.6 g/dL (ref 6.3–7.7)

## 2015-05-01 LAB — POCT URINALYSIS DIPSTICK
Exp date: 4.17
Glucose,UA POCT: NORMAL
Ketones,UA POCT: NEGATIVE
Leuk Esterase,UA POCT: 1 — AB
Lot #: 20642702
Nitrite,UA POCT: NEGATIVE
PH,UA POCT: 7 (ref 5–8)
Protein,UA POCT: NEGATIVE mg/dL
Specific gravity,UA POCT: 1.02 (ref 1.002–1.03)

## 2015-05-02 ENCOUNTER — Encounter: Payer: Self-pay | Admitting: Primary Care

## 2015-05-02 LAB — AEROBIC CULTURE: Aerobic Culture: 0

## 2015-05-03 NOTE — Progress Notes (Signed)
Complicated 69 year old gentleman with a history of prostate cancer, renal cell carcinoma, atrial fibrillation on anticoagulated therapy, and kidney stones.  He presents to our office today several episodes of painless hematuria.  There is a slight increase in urinary frequency.  No dysuria.  No increased back pain.  No fever or chills.  No other constitutional symptoms.  He's had no difficulty voiding.  No increase in nocturia.    Medications reviewed and updated in EMR  List reflects end of visit medication list.  Problem list updated.  Current Outpatient Prescriptions   Medication Sig Dispense Refill    apixaban (ELIQUIS) 5 MG tablet Place into mouth, chew and swallow      atorvastatin (LIPITOR) 20 MG tablet       metoprolol (TOPROL-XL) 100 MG 24 hr tablet Take 50 mg by mouth daily         TIKOSYN 500 MCG capsule 500 mcg 2 times daily         Multiple Vitamin (MULTIVITAMIN) per tablet Take 1 tablet by mouth daily       No current facility-administered medications for this visit.      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  Blood pressure 130/68, pulse 61, temperature 36.6 C (97.9 F), height 1.905 m (6\' 3" ), weight 114.8 kg (253 lb), SpO2 95 %.      Pleasant gentleman is moderately obese.  Skin warm and dry with actinic changes.  Head and neck exam normal.  No scleral icterus.  Lungs are clear.  No CVA tenderness.  Abdomen is soft and nontender.  No masses or organomegaly.    Office urinalysis shows gross hematuria.    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  He presents to our office today with gross hematuria.  He is on anticoagulation therapy.  I will obtain a culture to rule out  infection.  I think this is unlikely.  Clearly need to see his urologist for further evaluation pending culture.  His symptoms apply either a structural lesion or stone with hematuria worsened by his anticoagulation therapy.

## 2015-05-08 ENCOUNTER — Other Ambulatory Visit: Payer: Self-pay | Admitting: Gastroenterology

## 2015-05-08 ENCOUNTER — Other Ambulatory Visit: Payer: Self-pay | Admitting: Urology

## 2015-05-10 LAB — MEDICAL CYTOLOGY

## 2015-05-16 ENCOUNTER — Other Ambulatory Visit: Payer: Self-pay | Admitting: Gastroenterology

## 2015-05-25 ENCOUNTER — Telehealth: Payer: Self-pay | Admitting: Oncology

## 2015-05-25 NOTE — Telephone Encounter (Signed)
Received CT report fro [facility] urology done for hematuria (hx of stones and rad cystitis) was taken off eliquis and does have some stones again. 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 Had many ?? Re if was malignant and treatment for renal cell ca. Briefly discussed options but need tissue to see what actually is. COuld be a  Reactive LN is had recent infection with stones?? He will let us know once there is a plan.

## 2015-05-26 ENCOUNTER — Telehealth: Payer: Self-pay | Admitting: Oncology

## 2015-05-26 ENCOUNTER — Telehealth: Payer: Self-pay | Admitting: Primary Care

## 2015-05-26 NOTE — Telephone Encounter (Signed)
Defer to DMD though reluctant to stop aspirin without knowing patient.  Should be fine to hold Eliquis starting 48 hours before procedure.

## 2015-05-26 NOTE — Telephone Encounter (Signed)
Spoke w/Maxwell Wells - states that IR would not bx him as Too close to important structures. So wants to know how LN would be sampled. Also now getting calls about possible stent placement for kidney stones - sounds like to him that it would be a multistep process. He is not sure he wants to do this but has hydronephrosis.   Discussed that the LN otherwise  Would have to be sampled laparoscopically.   He is unsure if he wants to have any procedure for the stones or node. We discussed that if he has hydronephrosis there is risk to his kidney function. He will discuss further with [name]. He is unsure if he may want to have a second opinion re treatment for the stones or not. Wants to ask [name] who he would recommend for opinion re stones.

## 2015-05-26 NOTE — Telephone Encounter (Signed)
Per dmd and tsc  Pt will hold eliquis starting 48 hour prior to procedure and Dr. Geannie Risen office contacted pts cardiolo  gist.

## 2015-05-26 NOTE — Telephone Encounter (Signed)
Patient is having laser surgery for stones on Oct. 18th and urologist wants to know if he can go off anti-coagulation therapy for 4 days prior. He is taking aspirin and Eliquis

## 2015-05-29 ENCOUNTER — Other Ambulatory Visit: Payer: Self-pay

## 2015-05-29 LAB — URINALYSIS REFLEX TO CULTURE
Blood,UA: NEGATIVE
Glucose, Ur: NEGATIVE mg/dL
Ketones, UA: NEGATIVE mg/dL
Nitrite,UA: NEGATIVE
PH,Ur: 5.5 (ref 5.0–8.0)
Protein,UA: NEGATIVE mg/dL
Specific Gravity,UA: 1.011 (ref 1.005–1.030)

## 2015-06-01 ENCOUNTER — Encounter: Payer: Self-pay | Admitting: Oncology

## 2015-07-14 ENCOUNTER — Encounter: Payer: Self-pay | Admitting: Gastroenterology

## 2015-08-09 ENCOUNTER — Telehealth: Payer: Self-pay | Admitting: Primary Care

## 2015-08-09 NOTE — Telephone Encounter (Signed)
Pt called, he is scheduled on 08/15/15 with Dr. Keturah Barre for Kidney and Heart Issues.  Requested if any appts become open sooner than 08/15/15, to please call him.

## 2015-08-10 ENCOUNTER — Other Ambulatory Visit: Payer: Self-pay | Admitting: Gastroenterology

## 2015-08-10 ENCOUNTER — Ambulatory Visit
Admission: RE | Admit: 2015-08-10 | Discharge: 2015-08-10 | Disposition: A | Discharge status: Home / Self Care | Payer: Self-pay | Source: Ambulatory Visit | Attending: Oncology | Admitting: Oncology

## 2015-08-10 DIAGNOSIS — C61 Malignant neoplasm of prostate: Secondary | ICD-10-CM

## 2015-08-10 LAB — CBC AND DIFFERENTIAL
Baso # K/uL: 0 10*3/uL (ref 0.0–0.1)
Basophil %: 0.5 %
Eos # K/uL: 0.1 10*3/uL (ref 0.0–0.5)
Eosinophil %: 1.5 %
Hematocrit: 44 % (ref 40–51)
Hemoglobin: 15.7 g/dL (ref 13.7–17.5)
IMM Granulocytes #: 0.1 10*3/uL (ref 0.0–0.1)
IMM Granulocytes: 0.6 %
Lymph # K/uL: 1.4 10*3/uL (ref 1.3–3.6)
Lymphocyte %: 15.9 %
MCH: 34 pg/cell — ABNORMAL HIGH (ref 26–32)
MCHC: 35 g/dL (ref 32–37)
MCV: 96 fL — ABNORMAL HIGH (ref 79–92)
Mono # K/uL: 0.9 10*3/uL — ABNORMAL HIGH (ref 0.3–0.8)
Monocyte %: 9.7 %
Neut # K/uL: 6.4 10*3/uL — ABNORMAL HIGH (ref 1.8–5.4)
Nucl RBC # K/uL: 0 10*3/uL (ref 0.0–0.0)
Nucl RBC %: 0 /100 WBC (ref 0.0–0.2)
Platelets: 265 10*3/uL (ref 150–330)
RBC: 4.6 MIL/uL (ref 4.6–6.1)
RDW: 12.9 % (ref 11.6–14.4)
Seg Neut %: 71.8 %
WBC: 8.9 10*3/uL (ref 4.2–9.1)

## 2015-08-10 LAB — COMPREHENSIVE METABOLIC PANEL
ALT: 26 U/L (ref 0–50)
AST: 14 U/L (ref 0–50)
Albumin: 4.5 g/dL (ref 3.5–5.2)
Alk Phos: 71 U/L (ref 40–130)
Anion Gap: 14 (ref 7–16)
Bilirubin,Total: 0.6 mg/dL (ref 0.0–1.2)
CO2: 24 mmol/L (ref 20–28)
Calcium: 9.1 mg/dL (ref 8.6–10.2)
Chloride: 102 mmol/L (ref 96–108)
Creatinine: 1.06 mg/dL (ref 0.67–1.17)
GFR,Black: 83 *
GFR,Caucasian: 72 *
Glucose: 109 mg/dL — ABNORMAL HIGH (ref 60–99)
Lab: 29 mg/dL — ABNORMAL HIGH (ref 6–20)
Potassium: 4.8 mmol/L (ref 3.3–5.1)
Sodium: 140 mmol/L (ref 133–145)
Total Protein: 7.1 g/dL (ref 6.3–7.7)

## 2015-08-10 LAB — MULTIPLE ORDERING DOCS

## 2015-08-10 LAB — PSA (EFF.4-2010): PSA (eff. 4-2010): <0.02 ng/mL (ref 0.00–4.00)

## 2015-08-10 LAB — NEUTROPHIL #-INSTRUMENT: Neutrophil #-Instrument: 6.4 10*3/uL

## 2015-08-10 LAB — TESTOSTERONE: Testosterone: 162 ng/dL — ABNORMAL LOW (ref 193–740)

## 2015-08-15 ENCOUNTER — Encounter: Payer: Self-pay | Admitting: Primary Care

## 2015-08-15 ENCOUNTER — Ambulatory Visit: Payer: Self-pay | Admitting: Primary Care

## 2015-08-15 VITALS — BP 128/60 | HR 89 | Temp 97.3°F | Ht 75.0 in | Wt 250.0 lb

## 2015-08-15 DIAGNOSIS — I480 Paroxysmal atrial fibrillation: Secondary | ICD-10-CM

## 2015-08-15 DIAGNOSIS — I10 Essential (primary) hypertension: Secondary | ICD-10-CM

## 2015-08-15 DIAGNOSIS — F32A Depression, unspecified: Secondary | ICD-10-CM

## 2015-08-16 ENCOUNTER — Encounter: Payer: Self-pay | Admitting: Gastroenterology

## 2015-08-17 NOTE — Progress Notes (Signed)
70 year old gentleman with a complicated medical history comes to our office today for a routine office visit.  His chief complaint is fatigue.  He has had an extensive workup urologically recently.  He has no recurrent cancer fortunately.  He is having multiple bouts of paroxysmal atrial fibrillation because of hematuria he is off anticoagulation therapy.  He denies any chest pain.  He is mildly short of breath.  No dizziness or lightheadedness.  No other recent change in medication.  He is accompanied by his wife today.  He is really quite frustrated by his recent illness and inability to do some of things he wants to be able to do during his retirement.  In his wife are planning to go to [place] again sometime in late January.    Medications reviewed and updated in EMR  List reflects end of visit medication list.  Problem list updated.  Current Outpatient Prescriptions   Medication Sig Dispense Refill    atorvastatin (LIPITOR) 20 MG tablet       metoprolol (TOPROL-XL) 100 MG 24 hr tablet Take 50 mg by mouth daily         TIKOSYN 500 MCG capsule 500 mcg 2 times daily         Multiple Vitamin (MULTIVITAMIN) per tablet Take 1 tablet by mouth daily       No current facility-administered medications for this visit.      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  Blood pressure 128/60, pulse 89, temperature 36.3 C (97.3 F), height 1.905 m (6\' 3" ), weight 113.4 kg (250 lb), SpO2 94 %.      Pleasant gentleman who looks somewhat depressed.  Skin warm and dry with actinic changes.  Head and neck exam normal.  No lymphadenopathy.  No thyromegaly.  No carotid bruits.  Lungs are clear without any rales or rhonchi.  Cardiac  exam reveals an irregular rhythm.  His heart rate is around 100.  There is a soft systolic murmur present.  No S3 gallop.  No edema.    Patient presents to our office today for a routine office visit.  He has multiple medical issues.  He will be seeing cardiology tomorrow and may need a cardiac ablation.  He also is obviously depressed.  I spent quite a time talking to he and his wife about this.  He does not want to pursue further evaluation or treatment.  Greater than 50% of this 35 minute visit was spent counseling and coordinating care for the patient.

## 2015-08-21 ENCOUNTER — Other Ambulatory Visit: Payer: Self-pay | Admitting: Urology

## 2015-08-22 LAB — AEROBIC CULTURE: Aerobic Culture: NO GROWTH

## 2015-08-28 NOTE — Progress Notes (Signed)
Subjective:     Note addended & closed  for eRecord    Patient ID: Maxwell Wells is a 70 y.o. male with both prostate and renal cell cancers.     CC: c/o R shoulder pain - had rotator cuff surgery. Just starting to increase movement in joint. One episode of kidney stone in since last visit.     HPI  Maxwell Wells has prostate cancer and renal cell carcinoma. He is status post laparoscopic radical prostatectomy in December 2004 for Gleason 8/10, P3, NX, MX adenocarcinoma of the prostate. He received radiotherapy in April and May of 2005. He was on ADT for detectable and rising PSA. The ADT was discontinued in August 2007 due to intractable and incapacitating hot flashes. He has been followed without treatment since then. His PSA had remained at the limit of detection. He had a CT scan as part of workup for recurrent kidney stones. It showed a 1.9 x 1.9 cm left renal mass. He underwent a laparoscopic partial nephrectomy in October 2010. Pathology report indicated that the mass had been removed in multiple fragments. 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 Margins could not be assessed and size of the tumor could not be determined. He is being followed without further intervention. He returns for a scheduled follow-up visit.  He last had cryoablation to his L kidney 01/27/13. He tolerated this well except for some mild flank discomfort and numbness since the procedure. He also notes he had bladder calculi that obstructed the bladder/sphincter requiring laser/lithtripsy while in [facility] in March. He gets routine imaging through Dr Holland Falling office at [facility].       Current Outpatient Prescriptions   Medication    atorvastatin (LIPITOR) 20 MG tablet    metoprolol (TOPROL-XL) 100 MG 24 hr tablet    TIKOSYN 500 MCG capsule    Multiple Vitamin (MULTIVITAMIN) per tablet     No current facility-administered medications for this visit.        Review of Systems  Constitutional: Negative for fever, chills and  fatigue. ECOG 1. He has been unable to do much d/t recent shoulder surgery.  HENT: Negative. Eyes: Negative.   Respiratory: Negative. Negative for chest tightness and shortness of breath. Cough resolved.  Cardiovascular: He has had some prolonged episodes of A fib in past. He is now on tikosyn with imiproved control. He is now feeling well with minimal symptoms - rarely feels any skipped beats.   Gastrointestinal: Negative. Negative for nausea, abdominal pain, constipation and abdominal distention.   Genitourinary: Negative for dysuria, hematuria. He sees Dr. [name] every 6 months alternating between CT scans and U/S to monitor his previously resected renal cell CA - repeat cryoablation last year.   Musculoskeletal: Positive for arthralgias (generalized-chronic ostly shoulders).   Skin: Negative.   Neurological: Negative. Negative for weakness, numbness and headaches.   He did experience some dizziness felt to be related to labrynthitis that has also improved.   Hematological: Negative. Negative for adenopathy. Does not bruise/bleed easily.   Psychiatric/Behavioral: He is in good spirits.        Objective:     Vitals:    02/17/14 1553   BP: 134/68   Pulse: 60   Resp: 16   Temp: 37 C (98.6 F)   Weight: 120.4 kg (265 lb 8 oz)        Ref. Range 02/14/2015 12:30   Sodium Latest Ref Range: 133 - 145 mmol/L 144   Potassium Latest  Ref Range: 3.3 - 5.1 mmol/L 4.8   Chloride Latest Ref Range: 96 - 108 mmol/L 106   CO2 Latest Ref Range: 20 - 28 mmol/L 21   Anion Gap Latest Ref Range: 7 - 16  17 (H)   UN Latest Ref Range: 6 - 20 mg/dL 20   Creatinine Latest Ref Range: 0.67 - 1.17 mg/dL 0.93   GFR,Black Latest Units: * 98   GFR,Caucasian Latest Units: * 85   Glucose Latest Ref Range: 60 - 99 mg/dL 116 (H)   Calcium Latest Ref Range: 8.6 - 10.2 mg/dL 8.7   Total Protein Latest Ref Range: 6.3 - 7.7 g/dL 6.4   Albumin Latest Ref Range: 3.5 - 5.2 g/dL 3.9   ALT Latest Ref Range: 0 - 50 U/L 32   AST Latest Ref Range: 0 - 50 U/L 18    Alk Phos Latest Ref Range: 40 - 130 U/L 75   Bilirubin,Total Latest Ref Range: 0.0 - 1.2 mg/dL 0.7   Testosterone Latest Ref Range: 193 - 740 ng/dL 312   WBC Latest Ref Range: 4.2 - 9.1 THOU/uL 7.3   RBC Latest Ref Range: 4.6 - 6.1 MIL/uL 4.5 (L)   Hemoglobin Latest Ref Range: 13.7 - 17.5 g/dL 15.0   Hematocrit Latest Ref Range: 40 - 51 % 43   MCV Latest Ref Range: 79 - 92 fL 94 (H)   MCH Latest Ref Range: 26 - 32 pg/cell 33 (H)   MCHC Latest Ref Range: 32 - 37 g/dL 35   RDW Latest Ref Range: 11.6 - 14.4 % 13.1   Platelets Latest Ref Range: 150 - 330 THOU/uL 275   Neut # K/uL Latest Ref Range: 1.8 - 5.4 THOU/uL 4.2   Neutrophil #-Instrument Latest Units: THOU/uL 4.2   Lymph # K/uL Latest Ref Range: 1.3 - 3.6 THOU/uL 1.8   Mono # K/uL Latest Ref Range: 0.3 - 0.8 THOU/uL 0.9 (H)   Eos # K/uL Latest Ref Range: 0.0 - 0.5 THOU/uL 0.2   Baso # K/uL Latest Ref Range: 0.0 - 0.1 THOU/uL 0.1   IMM Granulocytes # Latest Ref Range: 0.0 - 0.1 THOU/uL 0.1   Nucl RBC # K/uL Latest Ref Range: 0.0 - 0.0 THOU/uL 0.0   Seg Neut % Latest Units: % 58.1   Lymphocyte % Latest Units: % 24.6   Monocyte % Latest Units: % 12.6   Eosinophil % Latest Units: % 3.2   Basophil % Latest Units: % 0.8   IMM Granulocytes Latest Units: % 0.7   Nucl RBC % Latest Ref Range: 0.0 - 0.2 /100 WBC 0.0   PSA (eff. 11-2008) Latest Ref Range: 0.00 - 4.00 ng/mL <0.02       Physical Exam  Constitutional: He is oriented to person, place, and time. He appears well-developed and well-nourished. No distress.   HENT: Head: Normocephalic and atraumatic.   Mouth/Throat: Oropharynx is clear and moist.   Eyes: Pupils are equal, round, and reactive to light. No scleral icterus.   Neck: Normal range of motion. Neck supple.   Cardiovascular: Normal rate, irregular rhythm and normal heart sounds.   Pulmonary/Chest: Effort normal and breath sounds normal. He has no wheezes. He has no rales.   Abdominal: Soft. Bowel sounds are normal. He exhibits no distension and no mass. No  tenderness.   No inguinal or femoral nodes.  Musculoskeletal: Normal range of motion except for R arm which he favors. He exhibits no edema and no tenderness.   Lymphadenopathy: He  has no cervical or axillary adenopathy.   Neurological: He is alert and oriented to person, place, and time.   Skin: Skin is warm and dry. No rash noted.   Psychiatric: He has a normal mood and affect. His behavior is normal. Judgment and thought content normal.     Assessment:     56 -year-old man with prostate cancer and undetectable PSA. He remains without clinical or biochemical evidence of his prostate cancer. Per the patient [name] continues to follow him closely for his renal cell cancer and will now have Q 6 months CT's & U/S.     Plan:     1. We will continue to monitor him without intervention at this time for the prostate cancer. PSA is undetectable with a normal testosterone.  He is compliant with his urologic follow up with [name] for his recurrent RCC. He will continue to have imaging directed by his office. We are happy to assist in any way if there are any concerns.     2. He will return in 1 year with routine labs and a diagnostic PSA q 6 months.      3. He knows to call if there are any questions, concerns or new symptoms before that scheduled clinic visit. I encouraged him to be compliant with his normal health maintenance activities. He will follow up with cardiology for his A fib.     Discussed with Dr Sharalyn Ink,     Tawanna Solo, NP

## 2015-08-28 NOTE — Progress Notes (Signed)
Subjective:     Note addended & closed  for eRecord    Patient ID: Maxwell Wells is a 70 y.o. male with both prostate and renal cell cancers.     CC: c/o R shoulder pain - had rotator cuff surgery. Just starting to increase movement in joint. One episode of kidney stone in since last visit.     HPI  Maxwell Wells has prostate cancer and renal cell carcinoma. He is status post laparoscopic radical prostatectomy in December 2004 for Gleason 8/10, P3, NX, MX adenocarcinoma of the prostate. He received radiotherapy in April and May of 2005. He was on ADT for detectable and rising PSA. The ADT was discontinued in August 2007 due to intractable and incapacitating hot flashes. He has been followed without treatment since then. His PSA had remained at the limit of detection. He had a CT scan as part of workup for recurrent kidney stones. It showed a 1.9 x 1.9 cm left renal mass. He underwent a laparoscopic partial nephrectomy in October 2010. Pathology report indicated that the mass had been removed in multiple fragments. 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 Margins could not be assessed and size of the tumor could not be determined. He is being followed without further intervention. He returns for a scheduled follow-up visit.  He last had cryoablation to his L kidney 01/27/13. He tolerated this well except for some mild flank discomfort and numbness since the procedure. He also notes he had bladder calculi that obstructed the bladder/sphincter requiring laser/lithtripsy while in [facility] in March. He gets routine imaging through Maxwell Wells office at [facility].       Current Outpatient Prescriptions   Medication    atorvastatin (LIPITOR) 20 MG tablet    metoprolol (TOPROL-XL) 100 MG 24 hr tablet    TIKOSYN 500 MCG capsule    Multiple Vitamin (MULTIVITAMIN) per tablet     No current facility-administered medications for this visit.        Review of Systems  Constitutional: Negative for fever, chills and  fatigue. ECOG 1. He has been unable to do much d/t recent shoulder surgery.  HENT: Negative. Eyes: Negative.   Respiratory: Negative. Negative for chest tightness and shortness of breath. Cough resolved.  Cardiovascular: He has had some prolonged episodes of A fib in past. He is now on tikosyn with imiproved control. He is now feeling well with minimal symptoms - rarely feels any skipped beats.   Gastrointestinal: Negative. Negative for nausea, abdominal pain, constipation and abdominal distention.   Genitourinary: Negative for dysuria, hematuria. He sees Maxwell Wells every 6 months alternating between CT scans and U/S to monitor his previously resected renal cell CA - repeat cryoablation last year.   Musculoskeletal: Positive for arthralgias (generalized-chronic ostly shoulders).   Skin: Negative.   Neurological: Negative. Negative for weakness, numbness and headaches.   He did experience some dizziness felt to be related to labrynthitis that has also improved.   Hematological: Negative. Negative for adenopathy. Does not bruise/bleed easily.   Psychiatric/Behavioral: He is in good spirits.       Ref. Range 02/14/2015 12:30   Sodium Latest Ref Range: 133 - 145 mmol/L 144   Potassium Latest Ref Range: 3.3 - 5.1 mmol/L 4.8   Chloride Latest Ref Range: 96 - 108 mmol/L 106   CO2 Latest Ref Range: 20 - 28 mmol/L 21   Anion Gap Latest Ref Range: 7 - 16  17 (H)   UN Latest Ref  Range: 6 - 20 mg/dL 20   Creatinine Latest Ref Range: 0.67 - 1.17 mg/dL 0.93   GFR,Black Latest Units: * 98   GFR,Caucasian Latest Units: * 85   Glucose Latest Ref Range: 60 - 99 mg/dL 116 (H)   Calcium Latest Ref Range: 8.6 - 10.2 mg/dL 8.7   Total Protein Latest Ref Range: 6.3 - 7.7 g/dL 6.4   Albumin Latest Ref Range: 3.5 - 5.2 g/dL 3.9   ALT Latest Ref Range: 0 - 50 U/L 32   AST Latest Ref Range: 0 - 50 U/L 18   Alk Phos Latest Ref Range: 40 - 130 U/L 75   Bilirubin,Total Latest Ref Range: 0.0 - 1.2 mg/dL 0.7   Testosterone Latest Ref Range: 193 - 740  ng/dL 312   WBC Latest Ref Range: 4.2 - 9.1 THOU/uL 7.3   RBC Latest Ref Range: 4.6 - 6.1 MIL/uL 4.5 (L)   Hemoglobin Latest Ref Range: 13.7 - 17.5 g/dL 15.0   Hematocrit Latest Ref Range: 40 - 51 % 43   MCV Latest Ref Range: 79 - 92 fL 94 (H)   MCH Latest Ref Range: 26 - 32 pg/cell 33 (H)   MCHC Latest Ref Range: 32 - 37 g/dL 35   RDW Latest Ref Range: 11.6 - 14.4 % 13.1   Platelets Latest Ref Range: 150 - 330 THOU/uL 275   Neut # K/uL Latest Ref Range: 1.8 - 5.4 THOU/uL 4.2   Neutrophil #-Instrument Latest Units: THOU/uL 4.2   Lymph # K/uL Latest Ref Range: 1.3 - 3.6 THOU/uL 1.8   Mono # K/uL Latest Ref Range: 0.3 - 0.8 THOU/uL 0.9 (H)   Eos # K/uL Latest Ref Range: 0.0 - 0.5 THOU/uL 0.2   Baso # K/uL Latest Ref Range: 0.0 - 0.1 THOU/uL 0.1   IMM Granulocytes # Latest Ref Range: 0.0 - 0.1 THOU/uL 0.1   Nucl RBC # K/uL Latest Ref Range: 0.0 - 0.0 THOU/uL 0.0   Seg Neut % Latest Units: % 58.1   Lymphocyte % Latest Units: % 24.6   Monocyte % Latest Units: % 12.6   Eosinophil % Latest Units: % 3.2   Basophil % Latest Units: % 0.8   IMM Granulocytes Latest Units: % 0.7   Nucl RBC % Latest Ref Range: 0.0 - 0.2 /100 WBC 0.0   PSA (eff. 11-2008) Latest Ref Range: 0.00 - 4.00 ng/mL <0.02       Objective:     Vitals:    02/16/15 1543   BP: 142/68   Pulse: 61   Resp: 18   Temp: 36.8 C (98.2 F)   Weight: 116.6 kg (257 lb)   Height: 190.5 cm ('6\' 3"' )         Physical Exam  Constitutional: He is oriented to person, place, and time. He appears well-developed and well-nourished. No distress.   HENT: Head: Normocephalic and atraumatic.   Mouth/Throat: Oropharynx is clear and moist.   Eyes: Pupils are equal, round, and reactive to light. No scleral icterus.   Neck: Normal range of motion. Neck supple.   Cardiovascular: Normal rate, irregular rhythm and normal heart sounds.   Pulmonary/Chest: Effort normal and breath sounds normal. He has no wheezes. He has no rales.   Abdominal: Soft. Bowel sounds are normal. He exhibits no  distension and no mass. No tenderness.   No inguinal or femoral nodes.  Musculoskeletal: Normal range of motion except for R arm which he favors. He exhibits no edema and no  tenderness.   Lymphadenopathy: He has no cervical or axillary adenopathy.   Neurological: He is alert and oriented to person, place, and time.   Skin: Skin is warm and dry. No rash noted.   Psychiatric: He has a normal mood and affect. His behavior is normal. Judgment and thought content normal.     Assessment:     70 -year-old man with prostate cancer and undetectable PSA. He remains without clinical or biochemical evidence of his prostate cancer. Per the patient Maxwell Maxwell Wells continues to follow him closely for his renal cell cancer and will now have Q 6 months CT's & U/S.     Plan:     1. We will continue to monitor him without intervention at this time for the prostate cancer. PSA is undetectable with a normal testosterone.  He is compliant with his urologic follow up with Maxwell Maxwell Wells for his recurrent RCC. He will continue to have imaging directed by his office. We are happy to assist in any way if there are any concerns.     2. He will return in 1 year with routine labs and a diagnostic PSA q 6 months.      3. He knows to call if there are any questions, concerns or new symptoms before that scheduled clinic visit. I encouraged him to be compliant with his normal health maintenance activities. He will follow up with cardiology for his A fib.     Discussed with Maxwell Sharalyn Ink,     Tawanna Solo, NP

## 2015-11-17 ENCOUNTER — Encounter: Payer: Self-pay | Admitting: Gastroenterology

## 2015-12-18 ENCOUNTER — Ambulatory Visit: Payer: Self-pay | Admitting: Internal Medicine

## 2015-12-18 ENCOUNTER — Encounter: Payer: Self-pay | Admitting: Internal Medicine

## 2015-12-18 VITALS — BP 136/72 | HR 72 | Temp 97.4°F | Resp 18 | Ht 75.0 in | Wt 252.0 lb

## 2015-12-18 DIAGNOSIS — R0989 Other specified symptoms and signs involving the circulatory and respiratory systems: Secondary | ICD-10-CM

## 2015-12-18 DIAGNOSIS — I480 Paroxysmal atrial fibrillation: Secondary | ICD-10-CM

## 2015-12-18 DIAGNOSIS — I10 Essential (primary) hypertension: Secondary | ICD-10-CM

## 2015-12-18 DIAGNOSIS — J069 Acute upper respiratory infection, unspecified: Secondary | ICD-10-CM

## 2015-12-18 DIAGNOSIS — R062 Wheezing: Secondary | ICD-10-CM

## 2015-12-18 DIAGNOSIS — R519 Headache, unspecified: Secondary | ICD-10-CM

## 2015-12-18 LAB — PCMH FALL RISK PLAN

## 2015-12-18 LAB — PCMH FALL RISK ASSESSMENT

## 2015-12-18 MED ORDER — DOXYCYCLINE HYCLATE 100 MG PO TABS *I*: 100.0000 mg | ORAL_TABLET | Freq: Two times a day (BID) | ORAL | 0 refills | Status: DC

## 2015-12-18 MED ORDER — BENZONATATE 200 MG PO CAPS *I*
200.0000 mg | ORAL_CAPSULE | Freq: Three times a day (TID) | ORAL | 0 refills | Status: DC | PRN
Start: 2015-12-18 — End: 2016-02-29

## 2015-12-18 NOTE — Progress Notes (Signed)
Subjective:  Patient became sick one week ago.  He describes significant intermittent harsh dry coughing spasms that awaken him at night.  He has nasal congestion and runny nose with clear mucus, a bad sore throat, sinus headaches, right-sided sinus pressure, ear fullness and crusting of his right eye in the morning.  During the day he has clear watery drainage from his right eye.  Last night his eye was itchy and a little cloudy.  Denies any eye pain, vision changes, nausea or vomiting.  He feels wheezy and tells me his breathing is very noisy at night.  Denies any shortness of breath, fevers,  tooth pain.  He denies ear pain although tells me baseline he has no feeling in his left ear so can't tell if he has an infection.    He has tried intermittent loratadine, Advil, over-the-counter cough syrup and Mucinex DM with minimal improvement.    He does have a history of allergies but does not normally take allergy medicine.  He had asthma as a child in the cold air.  He quit smoking 4 years ago.    His wife is also sick but he feels he gave it to her.   reports that he has quit smoking. He has a 7.50 pack-year smoking history. He has never used smokeless tobacco.    Current Outpatient Prescriptions   Medication Sig    benzonatate (TESSALON) 200 MG capsule Take 1 capsule (200 mg total) by mouth 3 times daily as needed for Cough    atorvastatin (LIPITOR) 20 MG tablet     metoprolol (TOPROL-XL) 100 MG 24 hr tablet Take 50 mg by mouth daily       TIKOSYN 500 MCG capsule 500 mcg 2 times daily       Multiple Vitamin (MULTIVITAMIN) per tablet Take 1 tablet by mouth daily     Medications reviewed and changes were made / updated.    Objective: Patient is alert, oriented, in no acute distress. Fatigued and mildly ill-appearing.  Ambulatory.  Blood pressure 136/72, pulse 72, temperature 36.3 C (97.4 F), temperature source Temporal, resp. rate 18, height 1.905 m (6\' 3" ), weight 114.3 kg (252 lb), SpO2 98 %.    Left  Conjunctiva clear.  Right conjunctiva with very mild pinkness and clear tears.  No crusting, swelling or purulent drainage noted.  External ears and canals WNL.  TMs with serous fluid bubbles, no injection noted.  Nares injected.  Pharynx WNL with thick tan postnasal drip noted.    No sinus pain.  No adenopathy.    Respirations easy. Lungs with mild crackles in the right base, decreased airflow throughout, otherwise clear .  Trace forced expiratory wheeze.    Assessment and plan: This 70 y.o. male with cardiac history of hypertension and A. fib patient presents with one-week history of upper respiratory infection with significant sinus headaches, cough and crackles in the right base.    Will check a chest x-ray today to rule out a pneumonia.  (X-ray WNL).  In the meantime recommend Tessalon as needed for cough.  Loratadine 10 mg daily.  Guaifenesin 1200 mg twice daily.  Advil as needed.  He may also use Afrin nose spray for 3 days only.  Discussed handwashing, contagiousness, rest and fluids.  Warm compresses to his eye.  He will call if this worsens.  Will review x-ray results with patient over the phone.  If he has no steady improvement, worsening symptoms he will begin doxycycline as directed.  Orders Placed This Encounter   Procedures    *Chest standard frontal and lateral views     Standing Status:   Future     Standing Expiration Date:   12/17/2016     Order Specific Question:   Signs and symptoms/indications     Answer:   cough, crackles rt base. r/o pneumonia     Order Specific Question:   Imaging Site Preference     Answer:   [facility]    [facility] FALL RISK ASSESSMENT     This external order was created through the Results Console.    [facility] FALL RISK PLAN     This external order was created through the Results Console.       Patient Instructions   Loratadine 10 mg daily for nasal and ear component.  guiafenesin 1200 mg twice daily for thick post nasal drip / mucus.  advil as needed.    Afrin nose spray for 3 days  only for sinus and ear pressure.    Warm eye compresses and saline eye drops.  Handwashing.    If worsening you will need antibiotics.

## 2015-12-18 NOTE — Progress Notes (Signed)
Sick 1 week  Dry Cough int spasms, hs  Runny rt eye, crusty in am. Cloudy. itchy l;ast night. no pain  BL no feel left ear  Nc runny clear  Bad st  No ear pain tooth pain   Wheeze  Noisy breathing at night  No sob fever    Loratadine mucinex dm  advil otc cough syrup    All no meds  Child rad cold air  Quit 40 yr ago

## 2015-12-18 NOTE — Patient Instructions (Signed)
Loratadine 10 mg daily for nasal and ear component.  guiafenesin 1200 mg twice daily for thick post nasal drip / mucus.  advil as needed.    Afrin nose spray for 3 days only for sinus and ear pressure.    Warm eye compresses and saline eye drops.  Handwashing.    If worsening you will need antibiotics.

## 2015-12-21 ENCOUNTER — Encounter: Payer: Self-pay | Admitting: Gastroenterology

## 2015-12-26 ENCOUNTER — Ambulatory Visit: Payer: Self-pay | Admitting: Internal Medicine

## 2015-12-26 ENCOUNTER — Encounter: Payer: Self-pay | Admitting: Internal Medicine

## 2015-12-26 VITALS — BP 134/70 | HR 80 | Temp 98.3°F | Ht 75.0 in | Wt 250.6 lb

## 2015-12-26 DIAGNOSIS — J069 Acute upper respiratory infection, unspecified: Secondary | ICD-10-CM

## 2015-12-26 NOTE — Progress Notes (Signed)
CC: cough    HPI:  Maxwell Wells was in our office last week on 5/8 for URI.  He started tessalon and uses guaifenesin and loratadine and then started the doxycycline prescribed the following day.  Came in because the cough has been persistent and bothersome. Cough is dry, has not pain with deep breath nor is he short of breath.  Sinuses are congested, not painful, throat irritated not sore, has night sweats at baseline - unsure regarding fever.   Feels a bit better today, but has felt pretty ill all along. being outside yesterday  wonders if that increased cough, had more rhinorrhea yesterday.         Past Medical History:   Diagnosis Date    AF (atrial fibrillation)     Prostate cancer 10/18/2010    Renal cell cancer 10/18/2010     Allergies   Allergen Reactions    Food Anaphylaxis     Bananas       Current Outpatient Prescriptions   Medication Sig    doxycycline (VIBRA-TABS) 100 MG tablet Take 100 mg by mouth 2 times daily    benzonatate (TESSALON) 200 MG capsule Take 1 capsule (200 mg total) by mouth 3 times daily as needed for Cough    metoprolol (TOPROL-XL) 100 MG 24 hr tablet Take 50 mg by mouth daily       atorvastatin (LIPITOR) 20 MG tablet     TIKOSYN 500 MCG capsule 500 mcg 2 times daily       Multiple Vitamin (MULTIVITAMIN) per tablet Take 1 tablet by mouth daily       O:  Vitals:    12/26/15 1115   BP: 134/70   Pulse: 80   Temp: 36.8 C (98.3 F)   Weight: 113.7 kg (250 lb 9.6 oz)   Height: 1.905 m (6\' 3" )     General: Well-appearing, conversant male with dry cough  HEENT: Normocephalic, atraumatic; EOM intact, conjunctiva WNL;  Bilateral auditory canals WNL.  Bilateral TMs translucent;  No submandibular lymph adenopathy; oropharynx moist, neck supple.  Cardiovascular: RRR, S1-S2.   Pulmonary: Lungs clear to auscultation.  Musculoskeletal: Moving all extremities, ambulatory.  Skin: clean, dry and intact.  Psychiatric: Mood and affect WNL.    A: 26  yom presents  with complaint of bothersome dry cough.  Treated last week for URI: taking tessalon guaifenesin loratadine and doxycycline - clear lungs, no fever or pain, not SOB. Had CXR last week (NAD)  Likely airway irritation.     P: he has albuterol at home, can try that, continue cough suppressant and expect steady but gradual improvement - fever, pain SOB or purulent mucus, feeling worse - call.

## 2015-12-26 NOTE — Patient Instructions (Signed)
Your lungs are clear and antibiotic you are taking is suitable for bacterial respiratory infection. finish that   Suspect your airway is inflamed and that is causing reactive cough - your chest xray was ok, you dont have a fever, i think you are on the right track but it will perhaps take a coupe more weeks before you stop coughing.    You can probably take less guaifenesin now, as your cough is dry.    The loratadine is for sinuses mostly,   the tessalon is cough suppressant (delsym is alternative - dextromethorphan)     Ok to try albuterol inhaler.     Call for ongoing concerns.

## 2016-02-14 ENCOUNTER — Other Ambulatory Visit
Admission: RE | Admit: 2016-02-14 | Discharge: 2016-02-14 | Disposition: A | Discharge status: Home / Self Care | Payer: Self-pay | Source: Ambulatory Visit | Attending: Oncology | Admitting: Oncology

## 2016-02-14 DIAGNOSIS — C61 Malignant neoplasm of prostate: Secondary | ICD-10-CM

## 2016-02-14 LAB — TESTOSTERONE: Testosterone: 192 ng/dL — ABNORMAL LOW (ref 193–740)

## 2016-02-14 LAB — CBC AND DIFFERENTIAL
Baso # K/uL: 0 10*3/uL (ref 0.0–0.1)
Basophil %: 0.7 %
Eos # K/uL: 0.1 10*3/uL (ref 0.0–0.5)
Eosinophil %: 2.5 %
Hematocrit: 44 % (ref 40–51)
Hemoglobin: 15.4 g/dL (ref 13.7–17.5)
IMM Granulocytes #: 0 10*3/uL (ref 0.0–0.1)
IMM Granulocytes: 0.5 %
Lymph # K/uL: 1.4 10*3/uL (ref 1.3–3.6)
Lymphocyte %: 24.4 %
MCH: 34 pg/cell — ABNORMAL HIGH (ref 26–32)
MCHC: 35 g/dL (ref 32–37)
MCV: 96 fL — ABNORMAL HIGH (ref 79–92)
Mono # K/uL: 0.6 10*3/uL (ref 0.3–0.8)
Monocyte %: 10.6 %
Neut # K/uL: 3.4 10*3/uL (ref 1.8–5.4)
Nucl RBC # K/uL: 0 10*3/uL (ref 0.0–0.0)
Nucl RBC %: 0 /100 WBC (ref 0.0–0.2)
Platelets: 250 10*3/uL (ref 150–330)
RBC: 4.5 MIL/uL — ABNORMAL LOW (ref 4.6–6.1)
RDW: 12.8 % (ref 11.6–14.4)
Seg Neut %: 61.3 %
WBC: 5.6 10*3/uL (ref 4.2–9.1)

## 2016-02-14 LAB — COMPREHENSIVE METABOLIC PANEL
ALT: 31 U/L (ref 0–50)
AST: 18 U/L (ref 0–50)
Albumin: 4.1 g/dL (ref 3.5–5.2)
Alk Phos: 70 U/L (ref 40–130)
Anion Gap: 17 — ABNORMAL HIGH (ref 7–16)
Bilirubin,Total: 0.7 mg/dL (ref 0.0–1.2)
CO2: 21 mmol/L (ref 20–28)
Calcium: 8.7 mg/dL (ref 8.6–10.2)
Chloride: 104 mmol/L (ref 96–108)
Creatinine: 0.85 mg/dL (ref 0.67–1.17)
GFR,Black: 103 *
GFR,Caucasian: 89 *
Glucose: 122 mg/dL — ABNORMAL HIGH (ref 60–99)
Lab: 22 mg/dL — ABNORMAL HIGH (ref 6–20)
Potassium: 4.4 mmol/L (ref 3.3–5.1)
Sodium: 142 mmol/L (ref 133–145)
Total Protein: 6.8 g/dL (ref 6.3–7.7)

## 2016-02-14 LAB — NEUTROPHIL #-INSTRUMENT: Neutrophil #-Instrument: 3.4 10*3/uL

## 2016-02-14 LAB — PSA (EFF.4-2010): PSA (eff. 4-2010): <0.02 ng/mL (ref 0.00–4.00)

## 2016-02-14 NOTE — Progress Notes (Unsigned)
Subjective:     Note addended & closed  for eRecord    Patient ID: Maxwell Wells is a 70 y.o. male with both prostate and renal cell cancers.     CC: c/o R shoulder pain - had rotator cuff surgery. Just starting to increase movement in joint. One episode of kidney stone in since last visit.     HPI  Maxwell Wells has prostate cancer and renal cell carcinoma. He is status post laparoscopic radical prostatectomy in December 2004 for Gleason 8/10, P3, NX, MX adenocarcinoma of the prostate. He received radiotherapy in April and May of 2005. He was on ADT for detectable and rising PSA. The ADT was discontinued in August 2007 due to intractable and incapacitating hot flashes. He has been followed without treatment since then. His PSA had remained at the limit of detection. He had a CT scan as part of workup for recurrent kidney stones. It showed a 1.9 x 1.9 cm left renal mass. He underwent a laparoscopic partial nephrectomy in October 2010. Pathology report indicated that the mass had been removed in multiple fragments. 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 Margins could not be assessed and size of the tumor could not be determined. He is being followed without further intervention. He returns for a scheduled follow-up visit.  He last had cryoablation to his L kidney 01/27/13. He tolerated this well except for some mild flank discomfort and numbness since the procedure. He also notes he had bladder calculi that obstructed the bladder/sphincter requiring laser/lithtripsy while in [facility] in March. He gets routine imaging through Maxwell Wells office at [facility].       Current Outpatient Prescriptions   Medication    benzonatate (TESSALON) 200 MG capsule    atorvastatin (LIPITOR) 20 MG tablet    metoprolol (TOPROL-XL) 100 MG 24 hr tablet    TIKOSYN 500 MCG capsule    Multiple Vitamin (MULTIVITAMIN) per tablet     No current facility-administered medications for this visit.        Review of  Systems  Constitutional: Negative for fever, chills and fatigue. ECOG 1. He has been unable to do much d/t recent shoulder surgery.  HENT: Negative. Eyes: Negative.   Respiratory: Negative. Negative for chest tightness and shortness of breath. Cough resolved.  Cardiovascular: He has had some prolonged episodes of A fib in past. He is now on tikosyn with imiproved control. He is now feeling well with minimal symptoms - rarely feels any skipped beats.   Gastrointestinal: Negative. Negative for nausea, abdominal pain, constipation and abdominal distention.   Genitourinary: Negative for dysuria, hematuria. He sees Maxwell Wells every 6 months alternating between CT scans and U/S to monitor his previously resected renal cell CA - repeat cryoablation last year.   Musculoskeletal: Positive for arthralgias (generalized-chronic ostly shoulders).   Skin: Negative.   Neurological: Negative. Negative for weakness, numbness and headaches.   He did experience some dizziness felt to be related to labrynthitis that has also improved.   Hematological: Negative. Negative for adenopathy. Does not bruise/bleed easily.   Psychiatric/Behavioral: He is in good spirits.       Ref. Range 02/14/2016 09:57   Sodium Latest Ref Range: 133 - 145 mmol/L 142   Potassium Latest Ref Range: 3.3 - 5.1 mmol/L 4.4   Chloride Latest Ref Range: 96 - 108 mmol/L 104   CO2 Latest Ref Range: 20 - 28 mmol/L 21   Anion Gap Latest Ref Range: 7 - 16  17 (H)   UN Latest Ref Range: 6 - 20 mg/dL 22 (H)   Creatinine Latest Ref Range: 0.67 - 1.17 mg/dL 0.85   GFR,Black Latest Units: * 103   GFR,Caucasian Latest Units: * 89   Glucose Latest Ref Range: 60 - 99 mg/dL 122 (H)   Calcium Latest Ref Range: 8.6 - 10.2 mg/dL 8.7   Total Protein Latest Ref Range: 6.3 - 7.7 g/dL 6.8   Albumin Latest Ref Range: 3.5 - 5.2 g/dL 4.1   ALT Latest Ref Range: 0 - 50 U/L 31   AST Latest Ref Range: 0 - 50 U/L 18   Alk Phos Latest Ref Range: 40 - 130 U/L 70   Bilirubin,Total Latest Ref Range:  0.0 - 1.2 mg/dL 0.7   Testosterone Latest Ref Range: 193 - 740 ng/dL 192 (L)   WBC Latest Ref Range: 4.2 - 9.1 THOU/uL 5.6   RBC Latest Ref Range: 4.6 - 6.1 MIL/uL 4.5 (L)   Hemoglobin Latest Ref Range: 13.7 - 17.5 g/dL 15.4   Hematocrit Latest Ref Range: 40 - 51 % 44   MCV Latest Ref Range: 79 - 92 fL 96 (H)   MCH Latest Ref Range: 26 - 32 pg/cell 34 (H)   MCHC Latest Ref Range: 32 - 37 g/dL 35   RDW Latest Ref Range: 11.6 - 14.4 % 12.8   Platelets Latest Ref Range: 150 - 330 THOU/uL 250   Neut # K/uL Latest Ref Range: 1.8 - 5.4 THOU/uL 3.4   Neutrophil #-Instrument Latest Units: THOU/uL 3.4   Lymph # K/uL Latest Ref Range: 1.3 - 3.6 THOU/uL 1.4   Mono # K/uL Latest Ref Range: 0.3 - 0.8 THOU/uL 0.6   Eos # K/uL Latest Ref Range: 0.0 - 0.5 THOU/uL 0.1   Baso # K/uL Latest Ref Range: 0.0 - 0.1 THOU/uL 0.0   IMM Granulocytes # Latest Ref Range: 0.0 - 0.1 THOU/uL 0.0   Nucl RBC # K/uL Latest Ref Range: 0.0 - 0.0 THOU/uL 0.0   Seg Neut % Latest Units: % 61.3   Lymphocyte % Latest Units: % 24.4   Monocyte % Latest Units: % 10.6   Eosinophil % Latest Units: % 2.5   Basophil % Latest Units: % 0.7   IMM Granulocytes Latest Units: % 0.5   Nucl RBC % Latest Ref Range: 0.0 - 0.2 /100 WBC 0.0   PSA (eff. 11-2008) Latest Ref Range: 0.00 - 4.00 ng/mL <0.02       Objective:     There were no vitals filed for this visit.      Physical Exam  Constitutional: He is oriented to person, place, and time. He appears well-developed and well-nourished. No distress.   HENT: Head: Normocephalic and atraumatic.   Mouth/Throat: Oropharynx is clear and moist.   Eyes: Pupils are equal, round, and reactive to light. No scleral icterus.   Neck: Normal range of motion. Neck supple.   Cardiovascular: Normal rate, irregular rhythm and normal heart sounds.   Pulmonary/Chest: Effort normal and breath sounds normal. He has no wheezes. He has no rales.   Abdominal: Soft. Bowel sounds are normal. He exhibits no distension and no mass. No tenderness.   No  inguinal or femoral nodes.  Musculoskeletal: Normal range of motion except for R arm which he favors. He exhibits no edema and no tenderness.   Lymphadenopathy: He has no cervical or axillary adenopathy.   Neurological: He is alert and oriented to person, place, and time.  Skin: Skin is warm and dry. No rash noted.   Psychiatric: He has a normal mood and affect. His behavior is normal. Judgment and thought content normal.     Assessment:     89 -year-old man with prostate cancer and undetectable PSA. He remains without clinical or biochemical evidence of his prostate cancer. Per the patient Maxwell Maxwell Wells continues to follow him closely for his renal cell cancer and will now have Q 6 months CT's & U/S.     Plan:     1. We will continue to monitor him without intervention at this time for the prostate cancer. PSA is undetectable with a normal testosterone.  He is compliant with his urologic follow up with Maxwell Maxwell Wells for his recurrent RCC. He will continue to have imaging directed by his office. We are happy to assist in any way if there are any concerns.     2. He will return in 1 year with routine labs and a diagnostic PSA q 6 months.      3. He knows to call if there are any questions, concerns or new symptoms before that scheduled clinic visit. I encouraged him to be compliant with his normal health maintenance activities. He will follow up with cardiology for his A fib.     Discussed with Maxwell Sharalyn Ink,     Tawanna Solo, NP

## 2016-02-15 ENCOUNTER — Ambulatory Visit: Payer: Self-pay | Admitting: Oncology

## 2016-02-22 ENCOUNTER — Encounter: Payer: Self-pay | Admitting: Gastroenterology

## 2016-02-29 ENCOUNTER — Ambulatory Visit: Payer: Self-pay | Admitting: Internal Medicine

## 2016-02-29 ENCOUNTER — Encounter: Payer: Self-pay | Admitting: Internal Medicine

## 2016-02-29 VITALS — BP 142/72 | HR 68 | Temp 98.7°F | Ht 75.0 in | Wt 252.8 lb

## 2016-02-29 DIAGNOSIS — D179 Benign lipomatous neoplasm, unspecified: Secondary | ICD-10-CM

## 2016-02-29 LAB — PCMH FALL RISK ASSESSMENT

## 2016-02-29 NOTE — Progress Notes (Signed)
CC: mass on back     HPI:  Maxwell Wells has a mass on his back that has been there for years - starting to bother him especially when he leans back on it - slightly tender.  No other concern      Past Medical History:   Diagnosis Date    AF (atrial fibrillation)     Prostate cancer 10/18/2010    Renal cell cancer 10/18/2010     Allergies   Allergen Reactions    Food Anaphylaxis     Bananas       Current Outpatient Prescriptions   Medication Sig    tamsulosin (FLOMAX) 0.4 MG capsule Take 0.4 mg by mouth every evening    atorvastatin (LIPITOR) 20 MG tablet     metoprolol (TOPROL-XL) 100 MG 24 hr tablet Take 50 mg by mouth daily       TIKOSYN 500 MCG capsule 500 mcg 2 times daily       Multiple Vitamin (MULTIVITAMIN) per tablet Take 1 tablet by mouth daily       O:  Vitals:    02/29/16 1101   BP: 142/72   Pulse: 68   Temp: 37.1 C (98.7 F)   Weight: 114.7 kg (252 lb 12.8 oz)   Height: 1.905 m (6\' 3" )     General: Well-appearing, conversant male  Musculoskeletal: Moving all extremities, ambulatory.  Back: has soft nontender mass upper mid back over spine (3x4 cm) no erythema.  Skin: clean, dry and intact.      A:  30 yom presents  with complaint of noticeable mass mid upper back on spine. Wife tells him its been there for years - he can feel it when he leans against a chair came in for advisement.     DDX includes: lipoma    P: recommended Dr Cain Sieve for consult regarding removal.

## 2016-02-29 NOTE — Patient Instructions (Signed)
You have a soft mass in central upper spine - likely a lipoma.  See what Dr Cain Sieve has to offer.     Sherrie Sport Licensed conveyancer)  [place],   [place]

## 2016-03-31 NOTE — Progress Notes (Signed)
Subjective:      Patient ID: Maxwell Wells is a 70 y.o. male with both prostate and renal cell cancers.     CC: c/o ongoing issues with bladder stones and needs to have them "cleaned out soon" and states will probably have to do this annually.     HPI  Maxwell Wells has prostate cancer and renal cell carcinoma.   He is status post laparoscopic radical prostatectomy in December 2004 for Gleason 8/10, P3, NX, MX adenocarcinoma of the prostate. He received radiotherapy in April and May of 2005. He was on ADT for detectable and rising PSA. The ADT was discontinued in August 2007 due to intractable and incapacitating hot flashes. He has been followed without treatment since then. His PSA had remained at the limit of detection. He had a CT scan as part of workup for recurrent kidney stones. It showed a 1.9 x 1.9 cm left renal mass. He underwent a laparoscopic partial nephrectomy in October 2010. Pathology report indicated that the mass had been removed in multiple fragments. 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 Margins could not be assessed and size of the tumor could not be determined. He is being followed without further intervention. He returns for a scheduled follow-up visit.  He last had cryoablation to his L kidney 01/27/13. He tolerated this well except for some mild flank discomfort and numbness since the procedure. He also notes he had bladder calculi that obstructed the bladder/sphincter requiring laser/lithtripsy while in [place]. He gets routine imaging through Dr Holland Falling office at [facility] which he states was good.       Current Outpatient Prescriptions   Medication    tamsulosin (FLOMAX) 0.4 MG capsule    atorvastatin (LIPITOR) 20 MG tablet    metoprolol (TOPROL-XL) 100 MG 24 hr tablet    TIKOSYN 500 MCG capsule    Multiple Vitamin (MULTIVITAMIN) per tablet     No current facility-administered medications for this visit.        Review of Systems  Constitutional: Negative for fever,  chills and fatigue. ECOG 1. Golfs 3-4 X/week.  HENT: Negative. Eyes: Negative.   Respiratory: Negative. Negative for chest tightness and shortness of breath. Cough resolved.  Cardiovascular: He has had some prolonged episodes of A fib in past. He is now on tikosyn with imiproved control. He is now feeling well with minimal symptoms - rarely feels any skipped beats.   Gastrointestinal: Negative. Negative for nausea, abdominal pain, constipation and abdominal distention.   Genitourinary: Negative for dysuria, hematuria. He sees Dr. [name] every 6 months alternating between CT scans and U/S to monitor his previously resected renal cell CA - prior cryoablation.   Musculoskeletal: Positive for arthralgias (generalized-chronic ostly shoulders).   Skin: Negative.   Neurological: Negative. Negative for weakness, numbness and headaches.   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   Hematological: Negative. Negative for adenopathy. Does not bruise/bleed easily.   Psychiatric/Behavioral: He is in good spirits.        Objective:     Vitals:    04/01/16 1536   BP: 111/54   Pulse: 72   Resp: 19   Temp: 37.1 C (98.8 F)   Weight: 116.9 kg (257 lb 11.5 oz)   Height: 190.5 cm (6' 3")        Ref. Range 02/14/2016 09:57   Sodium Latest Ref Range: 133 -  145 mmol/L 142   Potassium Latest Ref Range: 3.3 - 5.1 mmol/L 4.4   Chloride Latest Ref Range: 96 - 108 mmol/L 104   CO2 Latest Ref Range: 20 - 28 mmol/L 21   Anion Gap Latest Ref Range: 7 - 16  17 (H)   UN Latest Ref Range: 6 - 20 mg/dL 22 (H)   Creatinine Latest Ref Range: 0.67 - 1.17 mg/dL 0.85   GFR,Black Latest Units: * 103   GFR,Caucasian Latest Units: * 89   Glucose Latest Ref Range: 60 - 99 mg/dL 122 (H)   Calcium Latest Ref Range: 8.6 - 10.2 mg/dL 8.7   Total Protein Latest Ref Range: 6.3 - 7.7 g/dL 6.8   Albumin Latest Ref Range: 3.5 - 5.2 g/dL 4.1   ALT Latest Ref Range: 0 - 50 U/L 31   AST  Latest Ref Range: 0 - 50 U/L 18   Alk Phos Latest Ref Range: 40 - 130 U/L 70   Bilirubin,Total Latest Ref Range: 0.0 - 1.2 mg/dL 0.7   Testosterone Latest Ref Range: 193 - 740 ng/dL 192 (L)   WBC Latest Ref Range: 4.2 - 9.1 THOU/uL 5.6   RBC Latest Ref Range: 4.6 - 6.1 MIL/uL 4.5 (L)   Hemoglobin Latest Ref Range: 13.7 - 17.5 g/dL 15.4   Hematocrit Latest Ref Range: 40 - 51 % 44   MCV Latest Ref Range: 79 - 92 fL 96 (H)   MCH Latest Ref Range: 26 - 32 pg/cell 34 (H)   MCHC Latest Ref Range: 32 - 37 g/dL 35   RDW Latest Ref Range: 11.6 - 14.4 % 12.8   Platelets Latest Ref Range: 150 - 330 THOU/uL 250   Neut # K/uL Latest Ref Range: 1.8 - 5.4 THOU/uL 3.4   Lymph # K/uL Latest Ref Range: 1.3 - 3.6 THOU/uL 1.4   Mono # K/uL Latest Ref Range: 0.3 - 0.8 THOU/uL 0.6   Eos # K/uL Latest Ref Range: 0.0 - 0.5 THOU/uL 0.1   Baso # K/uL Latest Ref Range: 0.0 - 0.1 THOU/uL 0.0   IMM Granulocytes # Latest Ref Range: 0.0 - 0.1 THOU/uL 0.0   Nucl RBC # K/uL Latest Ref Range: 0.0 - 0.0 THOU/uL 0.0   Seg Neut % Latest Units: % 61.3   Lymphocyte % Latest Units: % 24.4   Monocyte % Latest Units: % 10.6   Eosinophil % Latest Units: % 2.5   Basophil % Latest Units: % 0.7   IMM Granulocytes Latest Units: % 0.5   Nucl RBC % Latest Ref Range: 0.0 - 0.2 /100 WBC 0.0   PSA (eff. 11-2008) Latest Ref Range: 0.00 - 4.00 ng/mL <0.02         Physical Exam  Constitutional: He is oriented to person, place, and time. He appears well-developed and well-nourished. No distress.   HENT: Head: Normocephalic and atraumatic. HOH in L ear.  Mouth/Throat: Oropharynx is clear and moist.   Eyes: Pupils are equal, round, and reactive to light. No scleral icterus.   Neck: Normal range of motion. Neck supple.   Cardiovascular: Normal rate, irregular rhythm and normal heart sounds.   Pulmonary/Chest: Effort normal and breath sounds normal. He has no wheezes. He has no rales.   Abdominal: Soft. Bowel sounds are normal. He exhibits no distension and no mass. No  tenderness.   No inguinal or femoral nodes.  Musculoskeletal: No pain over spine or SI joints.   Lymphadenopathy: He has no cervical or axillary adenopathy.  Neurological: He is alert and oriented to person, place, and time.   Skin: Skin is warm and dry. No rash noted.   Psychiatric: He has a normal mood and affect. His behavior is normal. Judgment and thought content normal.     Assessment:     24 -year-old man with prostate cancer and undetectable PSA. He remains without clinical or biochemical evidence of his prostate cancer.   Per the patient [name] continues to follow him closely for his renal cell cancer and will now have Q 6 months CT's & U/S. Annual cysto to remove bladder stones.    Plan:     1. We will continue to monitor him without intervention at this time for the prostate cancer. PSA is undetectable with a normal testosterone.  He is compliant with his urologic follow up with [name] for his recurrent RCC. He will continue to have imaging directed by his office. We are happy to assist in any way if there are any concerns.     2. He will return in 1 year with routine labs and a diagnostic PSA q 6 months.      3. He knows to call if there are any questions, concerns or new symptoms before that scheduled clinic visit. I encouraged him to be compliant with his normal health maintenance activities. He will follow up with cardiology for his A fib.     Discussed with Dr Sharalyn Ink,     Tawanna Solo, NP

## 2016-04-01 ENCOUNTER — Encounter: Payer: Self-pay | Admitting: Oncology

## 2016-04-01 ENCOUNTER — Ambulatory Visit: Payer: Self-pay | Admitting: Oncology

## 2016-04-01 VITALS — BP 111/54 | HR 72 | Temp 98.8°F | Resp 19 | Ht 75.0 in | Wt 257.7 lb

## 2016-04-01 DIAGNOSIS — C61 Malignant neoplasm of prostate: Secondary | ICD-10-CM

## 2016-04-02 ENCOUNTER — Telehealth: Payer: Self-pay | Admitting: Primary Care

## 2016-04-02 NOTE — Telephone Encounter (Signed)
Maxwell Wells was referred to Dr Cain Sieve by [name] at his 02/29/16 appointment.Marland Kitchen No Medicare Blue Choice referralwas done so [name] contacted [facility]. [name] called from [name] to verify that this was a valid referral. [name] process the referral in the system. Referral # [id number] is effective 04/02/16; open ended with unlimited visits. She had [name] on another line so she was going to inform him that referral was done.

## 2016-04-04 ENCOUNTER — Other Ambulatory Visit: Payer: Self-pay

## 2016-04-04 LAB — CBC
Hematocrit: 41 % (ref 40–52)
Hemoglobin: 14 g/dL (ref 13.0–18.0)
MCH: 32.3 pg (ref 26.0–34.0)
MCHC: 34.5 g/dL (ref 32.0–37.5)
MCV: 94 fL (ref 80–100)
Platelets: 243 10*3/uL (ref 150–450)
RBC: 4.34 10*6/uL — ABNORMAL LOW (ref 4.40–6.20)
RDW: 12.7 % (ref 0.0–15.2)
WBC: 5.7 10*3/uL (ref 4.0–11.0)

## 2016-04-04 LAB — URINALYSIS REFLEX TO CULTURE
Blood,UA: NEGATIVE
Glucose, Ur: NEGATIVE mg/dL
Ketones, UA: NEGATIVE mg/dL
Nitrite,UA: NEGATIVE
PH,Ur: 5.5 (ref 5.0–8.0)
Protein,UA: NEGATIVE mg/dL
Specific Gravity,UA: 1.021 (ref 1.005–1.030)

## 2016-04-04 LAB — BASIC METABOLIC PANEL
Anion Gap: 7 mEq/L (ref 4–16)
CO2: 25 mEq/L (ref 20–31)
Calcium: 8.8 mg/dL (ref 8.5–10.4)
Chloride: 108 mEq/L (ref 98–108)
Creatinine: 0.9 mg/dL (ref 0.7–1.2)
Glucose: 94 mg/dL (ref 65–100)
Lab: 17 mg/dL (ref 8–20)
Potassium: 4.7 mEq/L (ref 3.5–5.1)
Sodium: 140 mEq/L (ref 135–145)
UN/Creat Ratio: 18.9 (ref 12.0–20.0)

## 2016-04-04 LAB — ESTIMATED GFR
GFR,Black: >60 mL/min
GFR,Caucasian: >60 mL/min

## 2016-04-05 LAB — AEROBIC CULTURE: Aerobic Culture: NO GROWTH

## 2016-04-11 ENCOUNTER — Encounter: Payer: Self-pay | Admitting: Gastroenterology

## 2016-04-12 ENCOUNTER — Encounter: Payer: Self-pay | Admitting: Gastroenterology

## 2016-05-27 ENCOUNTER — Ambulatory Visit: Payer: Self-pay

## 2016-05-27 ENCOUNTER — Encounter: Payer: Self-pay | Admitting: Gastroenterology

## 2016-05-27 DIAGNOSIS — Z23 Encounter for immunization: Secondary | ICD-10-CM

## 2016-07-17 ENCOUNTER — Encounter: Payer: Self-pay | Admitting: Gastroenterology

## 2016-07-23 ENCOUNTER — Encounter: Payer: Self-pay | Admitting: Primary Care

## 2016-07-23 ENCOUNTER — Ambulatory Visit: Payer: Self-pay | Admitting: Primary Care

## 2016-07-23 VITALS — BP 118/64 | HR 81 | Temp 97.1°F | Ht 75.0 in | Wt 255.0 lb

## 2016-07-23 DIAGNOSIS — J209 Acute bronchitis, unspecified: Secondary | ICD-10-CM

## 2016-07-23 LAB — PCMH FALL RISK ASSESSMENT

## 2016-07-23 MED ORDER — BENZONATATE 200 MG PO CAPS *I*
200.0000 mg | ORAL_CAPSULE | Freq: Three times a day (TID) | ORAL | 1 refills | Status: DC | PRN
Start: 2016-07-23 — End: 2016-11-21

## 2016-07-23 NOTE — Progress Notes (Signed)
Maxwell Wells is a very pleasant 69 year old gentleman with a complicated medical history.  He presents to our office today with a respiratory illness of one week's duration.  He has a dry cough, no sputum production.  No fever or chills.  Positive fatigue.  No chest pain.  No upper respiratory congestion.    Medications reviewed and updated in EMR  List reflects end of visit medication list.  Problem list updated.  Current Outpatient Prescriptions   Medication Sig Dispense Refill    benzonatate (TESSALON) 200 MG capsule Take 1 capsule (200 mg total) by mouth 3 times daily as needed for Cough 30 capsule 1    tamsulosin (FLOMAX) 0.4 MG capsule Take 0.4 mg by mouth every evening   In AM & only PRN      atorvastatin (LIPITOR) 20 MG tablet       metoprolol (TOPROL-XL) 100 MG 24 hr tablet Take 50 mg by mouth daily         TIKOSYN 500 MCG capsule 500 mcg 2 times daily         Multiple Vitamin (MULTIVITAMIN) per tablet Take 1 tablet by mouth daily       No current facility-administered medications for this visit.      Allergies---  Food  Patient Active Problem List   Diagnosis Code    Prostate cancer C61    Renal cell cancer C64.9    Male Erectile Disorder F52.8    Arthritis M12.9    Nephrolithiasis N20.0    Post Prostatectomy Z98.89    Other and unspecified hyperlipidemia E78.5    Atrial fibrillation I48.91     Body mass index is 31.87 kg/(m^2).  Blood pressure 118/64, pulse 81, temperature 36.2 C (97.1 F), height 1.905 m (6\' 3" ), weight 115.7 kg (255 lb), SpO2 97 %.      Pleasant gentleman no apparent distress.  Skin warm and dry.  Head and neck exam basically normal.  He has some mild dullness to his left tympanic membrane.  No erythema.  No lymphadenopathy.  Lungs clear.    Patient has a viral respiratory illness.  No indication to treat with antibiotic.  Symptomatic treatment indicated.  I also prescribed Tessalon Perles  Plan follow-up as needed.

## 2016-08-14 ENCOUNTER — Telehealth: Payer: Self-pay | Admitting: Primary Care

## 2016-08-14 NOTE — Telephone Encounter (Signed)
Patient was seen on 12/12 for a cough.  Patient was prescribed Tessalon, but stopped taking them when he thought the cough was better.  Patient started taking them again yesterday when his cough had gotten worse.  Patient states he has 12 pills left.   Patient is not sure if he needs another medication or not.  Pls call patient back.

## 2016-08-19 MED ORDER — CEFUROXIME AXETIL 250 MG PO TABS *I*
250.0000 mg | ORAL_TABLET | Freq: Two times a day (BID) | ORAL | 0 refills | Status: DC
Start: 2016-08-19 — End: 2016-11-21

## 2016-08-19 NOTE — Telephone Encounter (Signed)
done

## 2016-08-19 NOTE — Telephone Encounter (Signed)
Spoke to pt notified of  rx sent in.

## 2016-08-19 NOTE — Addendum Note (Signed)
Addended by: Rogene Houston on: 08/19/2016 09:11 AM     Modules accepted: Orders

## 2016-08-19 NOTE — Telephone Encounter (Addendum)
Current SX: cough, head is plugged, ears makes a sound when he breathes at night, can't sleep because sound is deafening.  Pt is requesting an RX for an Antibiotic to pharmacy on file.  On his way to [facility] now.  Please advise.

## 2016-11-18 ENCOUNTER — Other Ambulatory Visit
Admission: RE | Admit: 2016-11-18 | Discharge: 2016-11-18 | Disposition: A | Discharge status: Home / Self Care | Payer: Medicare (Managed Care) | Source: Ambulatory Visit | Attending: Oncology | Admitting: Oncology

## 2016-11-18 DIAGNOSIS — C61 Malignant neoplasm of prostate: Secondary | ICD-10-CM | POA: Insufficient documentation

## 2016-11-18 LAB — CBC AND DIFFERENTIAL
Baso # K/uL: 0.1 10*3/uL (ref 0.0–0.1)
Basophil %: 0.9 %
Eos # K/uL: 0.2 10*3/uL (ref 0.0–0.5)
Eosinophil %: 2.8 %
Hematocrit: 47 % (ref 40–51)
Hemoglobin: 16.2 g/dL (ref 13.7–17.5)
IMM Granulocytes #: 0 10*3/uL (ref 0.0–0.1)
IMM Granulocytes: 0.4 %
Lymph # K/uL: 1.7 10*3/uL (ref 1.3–3.6)
Lymphocyte %: 24.7 %
MCH: 33 pg/cell — ABNORMAL HIGH (ref 26–32)
MCHC: 34 g/dL (ref 32–37)
MCV: 96 fL — ABNORMAL HIGH (ref 79–92)
Mono # K/uL: 0.7 10*3/uL (ref 0.3–0.8)
Monocyte %: 10.9 %
Neut # K/uL: 4 10*3/uL (ref 1.8–5.4)
Nucl RBC # K/uL: 0 10*3/uL (ref 0.0–0.0)
Nucl RBC %: 0 /100 WBC (ref 0.0–0.2)
Platelets: 271 10*3/uL (ref 150–330)
RBC: 4.9 MIL/uL (ref 4.6–6.1)
RDW: 12.6 % (ref 11.6–14.4)
Seg Neut %: 60.3 %
WBC: 6.7 10*3/uL (ref 4.2–9.1)

## 2016-11-18 LAB — PSA (EFF.4-2010): PSA (eff. 4-2010): <0.02 ng/mL (ref 0.00–4.00)

## 2016-11-18 LAB — COMPREHENSIVE METABOLIC PANEL
ALT: 35 U/L (ref 0–50)
AST: 16 U/L (ref 0–50)
Albumin: 4.4 g/dL (ref 3.5–5.2)
Alk Phos: 83 U/L (ref 40–130)
Anion Gap: 19 — ABNORMAL HIGH (ref 7–16)
Bilirubin,Total: 0.5 mg/dL (ref 0.0–1.2)
CO2: 25 mmol/L (ref 20–28)
Calcium: 10.2 mg/dL (ref 8.6–10.2)
Chloride: 101 mmol/L (ref 96–108)
Creatinine: 1.07 mg/dL (ref 0.67–1.17)
GFR,Black: 81 *
GFR,Caucasian: 70 *
Glucose: 127 mg/dL — ABNORMAL HIGH (ref 60–99)
Lab: 30 mg/dL — ABNORMAL HIGH (ref 6–20)
Potassium: 4.5 mmol/L (ref 3.3–5.1)
Sodium: 145 mmol/L (ref 133–145)
Total Protein: 7.4 g/dL (ref 6.3–7.7)

## 2016-11-18 LAB — NT-PRO BNP: NT-pro BNP: 214 pg/mL (ref 0–900)

## 2016-11-18 LAB — MULTIPLE ORDERING DOCS

## 2016-11-18 LAB — TESTOSTERONE: Testosterone: 149 ng/dL — ABNORMAL LOW (ref 193–740)

## 2016-11-18 LAB — NEUTROPHIL #-INSTRUMENT: Neutrophil #-Instrument: 4 10*3/uL

## 2016-11-21 ENCOUNTER — Ambulatory Visit: Payer: Medicare (Managed Care) | Attending: Internal Medicine | Admitting: Internal Medicine

## 2016-11-21 ENCOUNTER — Encounter: Payer: Self-pay | Admitting: Internal Medicine

## 2016-11-21 VITALS — BP 126/80 | HR 77 | Temp 98.2°F | Wt 258.0 lb

## 2016-11-21 DIAGNOSIS — H6690 Otitis media, unspecified, unspecified ear: Secondary | ICD-10-CM

## 2016-11-21 DIAGNOSIS — J069 Acute upper respiratory infection, unspecified: Secondary | ICD-10-CM

## 2016-11-21 LAB — PCMH FALL RISK ASSESSMENT

## 2016-11-21 MED ORDER — CEFUROXIME AXETIL 250 MG PO TABS *I*
250.0000 mg | ORAL_TABLET | Freq: Two times a day (BID) | ORAL | 0 refills | Status: AC
Start: 2016-11-21 — End: 2016-11-28

## 2016-11-21 MED ORDER — BENZONATATE 200 MG PO CAPS *I*: 200.0000 mg | ORAL_CAPSULE | Freq: Three times a day (TID) | ORAL | 0 refills | Status: AC | PRN

## 2016-11-21 NOTE — Patient Instructions (Signed)
Given your history and the right tympanic membrane appearance I think using Ceftin again is warranted.    Tessalon for cough, loratadine (generic for Claritin) for the sinuses.    Call for needs - lack of improvement.

## 2016-11-21 NOTE — Progress Notes (Signed)
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   No fever, not SOB.   Takes advil only.     I note in January he had similar complaint with the noise in the right ear. He was prescribed Ceftin which cleared this up. He feels in the past if not treated he tends toward ear infections.       Past Medical History:   Diagnosis Date    AF (atrial fibrillation)     Prostate cancer 10/18/2010    Renal cell cancer 10/18/2010     Allergies   Allergen Reactions    Food Anaphylaxis     Bananas       Current Outpatient Prescriptions   Medication Sig    tamsulosin (FLOMAX) 0.4 MG capsule Take 0.4 mg by mouth every evening   In AM & only PRN    atorvastatin (LIPITOR) 20 MG tablet     metoprolol (TOPROL-XL) 100 MG 24 hr tablet Take 50 mg by mouth daily       TIKOSYN 500 MCG capsule 500 mcg 2 times daily       Multiple Vitamin (MULTIVITAMIN) per tablet Take 1 tablet by mouth daily       O:  Vitals:    11/21/16 1405   BP: 126/80   Pulse: 77   Temp: 36.8 C (98.2 F)   Weight: 117 kg (258 lb)     General: Well-appearing, conversant male.  HEENT: Normocephalic, atraumatic; EOM intact, conjunctiva WNL;  Bilateral auditory canals WNL. right TM erythematous, left TM dull-translucent;  No submandibular lymph adenopathy; oropharynx moist, neck supple.  Cardiovascular: RRR, S1-S2.   Pulmonary: Lungs soft expiatory wheeze, moving air to bases.  Abdomen: Soft, nontender, nondistended;  Normoactive bowel sounds.  Musculoskeletal: Moving all extremities, ambulatory.  Skin: clean, dry and intact.  Psychiatric: Mood and affect WNL.    A: 53  yom presents  with complaint of right side ear fullness/tinnitus; recent URI with sinusitis and harsh cough.  Right TM is erythematous, feint expiatory wheeze in lungs but moving air well, no fever no SOB.     DDX includes:  otitis media on the right, viral URI    P: ceftin  for the ear (250 mg BID x 7 days) loratadine and tessalon. Hydrate, call if not steadily improved.

## 2016-11-25 ENCOUNTER — Encounter: Payer: Self-pay | Admitting: Gastroenterology

## 2016-11-28 ENCOUNTER — Ambulatory Visit: Payer: Medicare (Managed Care) | Attending: Primary Care

## 2016-11-28 DIAGNOSIS — Z23 Encounter for immunization: Secondary | ICD-10-CM

## 2016-11-28 NOTE — Progress Notes (Signed)
Pt presents for Tdap. Adminisitered tdap 0.37ml IM into left deltoid. Pt tolerated well. No contraindications. VIS given.

## 2016-12-05 ENCOUNTER — Telehealth: Payer: Self-pay | Admitting: Primary Care

## 2016-12-05 NOTE — Telephone Encounter (Signed)
Cannot really advise w/o him being seen he may need an inhaler, oral steroids etc. See if can be seen in office today or tomorrow if not ? [facility]

## 2016-12-05 NOTE — Telephone Encounter (Signed)
Maxwell Wells saw [name] 4/12 for an URI. [name] prescribed an antibiotic & Tessalon pearls. [name] hasn't had any improvement. He is still hacking and can hardly catch his breath. [name] sometimes brings up clear mucus when he coughs. Can you prescribe or advise something else for [name]?

## 2016-12-05 NOTE — Telephone Encounter (Signed)
Spoke with pt and offered him an appt for tomorrow. Pt stated that he thinks he may go to Urgent Care. If he decides not to, he will make an appt for Monday. Stated that he is feeling great at the moment but his wife wanted him to be seen.

## 2016-12-12 ENCOUNTER — Encounter: Payer: Self-pay | Admitting: Gastroenterology

## 2017-03-19 ENCOUNTER — Other Ambulatory Visit
Admission: RE | Admit: 2017-03-19 | Discharge: 2017-03-19 | Disposition: A | Discharge status: Home / Self Care | Payer: Medicare (Managed Care) | Source: Ambulatory Visit | Attending: Oncology | Admitting: Oncology

## 2017-03-19 DIAGNOSIS — C61 Malignant neoplasm of prostate: Secondary | ICD-10-CM | POA: Insufficient documentation

## 2017-03-19 LAB — COMPREHENSIVE METABOLIC PANEL
ALT: 33 U/L (ref 0–50)
AST: 24 U/L (ref 0–50)
Albumin: 4.1 g/dL (ref 3.5–5.2)
Alk Phos: 68 U/L (ref 40–130)
Anion Gap: 12 (ref 7–16)
Bilirubin,Total: 0.7 mg/dL (ref 0.0–1.2)
CO2: 22 mmol/L (ref 20–28)
Calcium: 8.8 mg/dL (ref 8.6–10.2)
Chloride: 106 mmol/L (ref 96–108)
Creatinine: 0.98 mg/dL (ref 0.67–1.17)
GFR,Black: 90 *
GFR,Caucasian: 78 *
Glucose: 113 mg/dL — ABNORMAL HIGH (ref 60–99)
Lab: 20 mg/dL (ref 6–20)
Potassium: 4.4 mmol/L (ref 3.3–5.1)
Sodium: 140 mmol/L (ref 133–145)
Total Protein: 6.3 g/dL (ref 6.3–7.7)

## 2017-03-19 LAB — NEUTROPHIL #-INSTRUMENT: Neutrophil #-Instrument: 4 10*3/uL

## 2017-03-19 LAB — CBC AND DIFFERENTIAL
Baso # K/uL: 0 10*3/uL (ref 0.0–0.1)
Basophil %: 0.6 %
Eos # K/uL: 0.2 10*3/uL (ref 0.0–0.5)
Eosinophil %: 2.3 %
Hematocrit: 45 % (ref 40–51)
Hemoglobin: 15.5 g/dL (ref 13.7–17.5)
IMM Granulocytes #: 0 10*3/uL (ref 0.0–0.1)
IMM Granulocytes: 0.6 %
Lymph # K/uL: 1.7 10*3/uL (ref 1.3–3.6)
Lymphocyte %: 25.3 %
MCH: 34 pg/cell — ABNORMAL HIGH (ref 26–32)
MCHC: 34 g/dL (ref 32–37)
MCV: 98 fL — ABNORMAL HIGH (ref 79–92)
Mono # K/uL: 0.9 10*3/uL — ABNORMAL HIGH (ref 0.3–0.8)
Monocyte %: 12.6 %
Neut # K/uL: 4 10*3/uL (ref 1.8–5.4)
Nucl RBC # K/uL: 0 10*3/uL (ref 0.0–0.0)
Nucl RBC %: 0 /100 WBC (ref 0.0–0.2)
Platelets: 253 10*3/uL (ref 150–330)
RBC: 4.6 MIL/uL (ref 4.6–6.1)
RDW: 12.8 % (ref 11.6–14.4)
Seg Neut %: 58.6 %
WBC: 6.9 10*3/uL (ref 4.2–9.1)

## 2017-03-19 LAB — TESTOSTERONE: Testosterone: 159 ng/dL — ABNORMAL LOW (ref 193–740)

## 2017-03-19 LAB — PSA (EFF.4-2010): PSA (eff. 4-2010): <0.02 ng/mL (ref 0.00–4.00)

## 2017-03-27 ENCOUNTER — Encounter: Payer: Self-pay | Admitting: Oncology

## 2017-03-27 ENCOUNTER — Ambulatory Visit: Payer: Medicare (Managed Care) | Attending: Oncology | Admitting: Oncology

## 2017-03-27 VITALS — BP 118/72 | HR 71 | Temp 97.3°F | Ht 75.0 in | Wt 259.0 lb

## 2017-03-27 DIAGNOSIS — C61 Malignant neoplasm of prostate: Secondary | ICD-10-CM | POA: Insufficient documentation

## 2017-03-27 NOTE — Progress Notes (Signed)
Hot flashes 2 to 3 during the day and 5 to 6 at night.   Subjective:      Patient ID: Maxwell Wells is a 71 y.o. male who returns in follow up of prostate- and renal cell cancers.     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 He received radiotherapy in April and May of 2005. He was on ADT for detectable and rising PSA. The ADT was discontinued in August 2007 due to hot flashes. He has been followed without treatment since then. His PSA had remained at the limit of detection. He had a CT scan as part of workup for recurrent kidney stones. It showed a 1.9 x 1.9 cm left renal mass. He underwent a laparoscopic partial nephrectomy in October 2010. Pathology report indicated that the mass had been removed in multiple fragments. 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 Margins could not be assessed and size of the tumor could not be determined. He is being followed without further intervention. He returns for a scheduled follow-up visit.  He last had cryoablation to his L kidney 01/27/13. He tolerated this well except for some mild flank discomfort and numbness since the procedure. He also notes he had bladder calculi that obstructed the bladder/sphincter requiring laser/lithtripsy while in [place]. He gets routine imaging through Dr Holland Falling office at [facility].       Current Outpatient Prescriptions   Medication    tamsulosin (FLOMAX) 0.4 MG capsule    atorvastatin (LIPITOR) 20 MG tablet    metoprolol (TOPROL-XL) 100 MG 24 hr tablet    TIKOSYN 500 MCG capsule    Multiple Vitamin (MULTIVITAMIN) per tablet     No current facility-administered medications for this visit.        Review of Systems  Constitutional: ECOG 1. Golfs 3-4 X/week.  HENT: Negative. Eyes: Negative.   Cardiovascular: He has had some prolonged episodes of A fib in past. He is now on tikosyn with imiproved control. He is now feeling well with  minimal symptoms - rarely feels any skipped beats.   Gastrointestinal: Negative. Negative for nausea, abdominal pain, constipation and abdominal distention.   Genitourinary: Negative for dysuria, hematuria. He sees Dr. [name] every 6 months alternating between CT scans and U/S to monitor his previously resected renal cell CA - prior cryoablation.    Hot flashes 2 to 3 during the day and 4 to 6 at night. Each lasts 1 to 2 minutes.      Objective:     Vitals:    03/27/17 1611   BP: 118/72   Pulse: 71   Temp: 36.3 C (97.3 F)   Weight: 117.5 kg (259 lb 0.7 oz)   Height: 190.5 cm (6\' 3" )     PSA <0.02 and testosterone 159.     Physical Exam  Constitutional: He is oriented to person, place, and time. He appears well-developed and well-nourished. No distress.   HENT: Head: Normocephalic and atraumatic.   Mouth/Throat: Oropharynx is clear and moist.   Eyes: No scleral icterus.   Neck: Normal range of motion. Neck supple.   Cardiovascular: Normal rate, irregular rhythm and normal heart sounds.   Pulmonary/Chest: Effort normal and breath sounds normal. He has no wheezes. He has no rales.   Abdominal: Soft. No distension, no mass. No tenderness.   Musculoskeletal: No pain over spine or SI joints.   Neurological: He  is alert and oriented to person, place, and time.  Psychiatric: He has a normal mood and affect. His behavior is normal. Judgment and thought content normal.     Assessment:     47 -year-old man with prostate cancer and undetectable PSA. He remains without clinical or biochemical evidence of his prostate cancer. [name] troubled by the hot flashes but was concerned about side effects of treatment such as engorgement of breast tissue, fluid retention, effect on the heart etc.   Per the patient [name] continues to follow him closely for his renal cell cancer and will now have Q 6 months CT's & U/S. Annual cysto to remove bladder stones.    Plan:     1. We will continue to monitor him without intervention at  this time for the prostate cancer. PSA is undetectable with a normal testosterone.  He is compliant with his urologic follow up with [name] for his recurrent RCC. He will continue to have imaging directed by his office. We are happy to assist in any way if there are any concerns.     2. Mr. And Mrs. B preferred to have a follow up appointment in a year. in 1 year with routine labs and a diagnostic PSA q 6 months.      3. Mr.B was urged to call if there are any questions, concerns or new symptoms before that scheduled clinic visit. I encouraged him to be compliant with his normal health maintenance activities.

## 2017-04-21 ENCOUNTER — Other Ambulatory Visit: Payer: Self-pay | Admitting: Gastroenterology

## 2017-04-28 ENCOUNTER — Encounter: Payer: Self-pay | Admitting: Primary Care

## 2017-04-28 ENCOUNTER — Encounter: Payer: Self-pay | Admitting: Gastroenterology

## 2017-04-28 ENCOUNTER — Ambulatory Visit: Payer: Medicare (Managed Care) | Attending: Primary Care | Admitting: Primary Care

## 2017-04-28 VITALS — BP 128/68 | HR 56 | Temp 98.3°F | Ht 74.96 in | Wt 260.0 lb

## 2017-04-28 DIAGNOSIS — I480 Paroxysmal atrial fibrillation: Secondary | ICD-10-CM

## 2017-04-28 DIAGNOSIS — Z23 Encounter for immunization: Secondary | ICD-10-CM

## 2017-04-28 DIAGNOSIS — C649 Malignant neoplasm of unspecified kidney, except renal pelvis: Secondary | ICD-10-CM

## 2017-04-28 DIAGNOSIS — R1030 Lower abdominal pain, unspecified: Secondary | ICD-10-CM

## 2017-04-28 LAB — PCMH FALL RISK ASSESSMENT

## 2017-04-28 NOTE — Progress Notes (Signed)
Very pleasant 71 year old gentleman with a complex medical history comes to our office today for a routine office visit.  He has a history of prostate and renal cell cancer.  He states that over the last several months he has had some persistent but not progressive pelvic pain.  He states on the right side of his perirectal area he has a funny pressure sensation feeling.  It occurs most with movement.  After some subtle stretching he does have some resolution of this.  No change in urinary habits.  No hematuria.  No change in bowel habits or rectal bleeding.  His colonoscopy is up-to-date.  No constitutional symptoms such as fever, chills, rigors.    Medications reviewed and updated in EMR  List reflects end of visit medication list.  Problem list updated.  Current Outpatient Prescriptions   Medication Sig Dispense Refill    tamsulosin (FLOMAX) 0.4 MG capsule Take 0.4 mg by mouth every evening   In AM & only PRN      atorvastatin (LIPITOR) 20 MG tablet       metoprolol (TOPROL-XL) 100 MG 24 hr tablet Take 50 mg by mouth daily         TIKOSYN 500 MCG capsule 500 mcg 2 times daily         Multiple Vitamin (MULTIVITAMIN) per tablet Take 1 tablet by mouth daily       No current facility-administered medications for this visit.      Allergies---  Food  Patient Active Problem List   Diagnosis Code    Prostate cancer C61    Renal cell cancer C64.9    Male Erectile Disorder F52.8    Arthritis M12.9    Nephrolithiasis N20.0    Post Prostatectomy Z98.89    Other and unspecified hyperlipidemia E78.5    Atrial fibrillation I48.91     Body mass index is 32.53 kg/(m^2).  Blood pressure 128/68, pulse 56, temperature 36.8 C (98.3 F), height 1.904 m (6' 2.96"), weight 117.9 kg (260 lb), SpO2 98 %.      Pleasant gentleman no apparent distress.  He is moderately obese.  Skin warm and dry with actinic changes present.  No scleral icterus.  Abdomen soft and nontender.  No masses.  No organomegaly.  Rectal exam not  performed.  He does have mild tenderness over the pelvic bone structure.  Hip range of motion is good.    Patient has a history of genitourinary cancers.  He presents to our office today with pelvic pain.  It seems more musculoskeletal than deep organ.  He already does have a CT of his abdomen and pelvis scheduled.  Will keep that appointment which should adequately imaged at area.  Further workup pending CT scan.  Influenza vaccine given

## 2017-05-07 ENCOUNTER — Other Ambulatory Visit
Admission: RE | Admit: 2017-05-07 | Discharge: 2017-05-07 | Disposition: A | Discharge status: Home / Self Care | Payer: Medicare (Managed Care) | Source: Ambulatory Visit

## 2017-05-07 DIAGNOSIS — M25561 Pain in right knee: Secondary | ICD-10-CM | POA: Insufficient documentation

## 2017-05-07 LAB — CRP: CRP: 1 mg/L (ref 0–10)

## 2017-05-07 LAB — SEDIMENTATION RATE, AUTOMATED: Sedimentation Rate: 3 mm/hr (ref 0–20)

## 2017-05-20 ENCOUNTER — Other Ambulatory Visit: Payer: Self-pay | Admitting: Gastroenterology

## 2017-05-28 ENCOUNTER — Other Ambulatory Visit: Payer: Self-pay

## 2017-05-28 LAB — BASIC METABOLIC PANEL
CO2: 25 mmol/L (ref 20–31)
Calcium: 8.7 mg/dL (ref 8.6–10.2)
Chloride: 106 mmol/L (ref 99–109)
Creatinine: 0.86 mg/dL (ref 0.70–1.30)
Glucose: 83 MG/DL (ref 60–140)
Lab: 24 mg/dL — ABNORMAL HIGH (ref 9–23)
Potassium: 4.5 mmol/L (ref 3.5–5.1)
Sodium: 140 mmol/L (ref 136–145)
UN/Creat Ratio: 28 — ABNORMAL HIGH (ref 10–20)

## 2017-05-28 LAB — ESTIMATED GFR
GFR,Black: >60 mL/min/{1.73_m2} (ref >=60)
GFR,Caucasian: >60 mL/min/{1.73_m2} (ref >=60)

## 2017-06-02 ENCOUNTER — Other Ambulatory Visit: Payer: Self-pay | Admitting: Gastroenterology

## 2017-06-04 ENCOUNTER — Other Ambulatory Visit: Payer: Self-pay | Admitting: Gastroenterology

## 2017-06-11 ENCOUNTER — Encounter: Payer: Self-pay | Admitting: Gastroenterology

## 2017-06-25 ENCOUNTER — Encounter: Payer: Self-pay | Admitting: Primary Care

## 2017-06-27 ENCOUNTER — Other Ambulatory Visit
Admission: RE | Admit: 2017-06-27 | Discharge: 2017-06-27 | Disposition: A | Discharge status: Home / Self Care | Payer: Medicare (Managed Care) | Source: Ambulatory Visit | Attending: Gastroenterology | Admitting: Gastroenterology

## 2017-06-27 DIAGNOSIS — R195 Other fecal abnormalities: Secondary | ICD-10-CM | POA: Insufficient documentation

## 2017-06-27 DIAGNOSIS — Z86010 Personal history of colonic polyps: Secondary | ICD-10-CM | POA: Insufficient documentation

## 2017-06-27 DIAGNOSIS — Z800 Family history of malignant neoplasm of digestive organs: Secondary | ICD-10-CM | POA: Insufficient documentation

## 2017-06-27 LAB — HM COLONOSCOPY

## 2017-06-30 LAB — SURGICAL PATHOLOGY

## 2017-07-01 ENCOUNTER — Other Ambulatory Visit: Payer: Self-pay

## 2017-07-01 ENCOUNTER — Encounter: Payer: Self-pay | Admitting: Primary Care

## 2017-07-01 LAB — URINALYSIS REFLEX TO CULTURE
Blood,UA: NEGATIVE
Glucose, Ur: NEGATIVE
Ketones, UA: NEGATIVE
Nitrite,UA: NEGATIVE
PH,Ur: 5.5 (ref 5.0–8.0)
Protein,UA: NEGATIVE
Specific Gravity,UA: 1.025 (ref 1.005–1.030)

## 2017-07-01 LAB — HEMATOCRIT: Hematocrit: 45 % (ref 40–52)

## 2017-07-01 LAB — URINE MICROSCOPIC (IQ200)

## 2017-07-01 LAB — UNMAPPED LAB RESULTS
Hematocrit (HT): 45 % — NL (ref 40–52)
Hemoglobin (HGB) (HT): 15.7 g/dL — NL (ref 13.0–18.0)

## 2017-07-02 LAB — AEROBIC CULTURE: Aerobic Culture: NO GROWTH

## 2017-07-10 ENCOUNTER — Telehealth: Payer: Self-pay | Admitting: Primary Care

## 2017-07-10 ENCOUNTER — Encounter: Payer: Self-pay | Admitting: Gastroenterology

## 2017-07-10 NOTE — Telephone Encounter (Signed)
Pt is requesting an appt with Dr. Keturah Barre for next week to discuss Test results.  Offered  Wednesday, 12/5, he refused as Wednesdays does not work for him.  Please advise and call pt back.

## 2017-07-11 ENCOUNTER — Encounter: Payer: Self-pay | Admitting: Gastroenterology

## 2017-07-11 NOTE — Telephone Encounter (Signed)
Spoke to pt  appt made per dmd

## 2017-07-15 ENCOUNTER — Ambulatory Visit: Payer: Medicare (Managed Care) | Attending: Primary Care | Admitting: Primary Care

## 2017-07-15 ENCOUNTER — Encounter: Payer: Self-pay | Admitting: Primary Care

## 2017-07-15 VITALS — BP 110/84 | HR 73 | Temp 97.7°F | Ht 74.96 in | Wt 255.8 lb

## 2017-07-15 DIAGNOSIS — I480 Paroxysmal atrial fibrillation: Secondary | ICD-10-CM

## 2017-07-15 DIAGNOSIS — I10 Essential (primary) hypertension: Secondary | ICD-10-CM

## 2017-07-15 DIAGNOSIS — R1030 Lower abdominal pain, unspecified: Secondary | ICD-10-CM

## 2017-07-15 DIAGNOSIS — C649 Malignant neoplasm of unspecified kidney, except renal pelvis: Secondary | ICD-10-CM

## 2017-07-18 ENCOUNTER — Encounter: Payer: Self-pay | Admitting: Primary Care

## 2017-07-18 NOTE — Progress Notes (Signed)
Complicated 71 year old gentleman comes to our office today for a routine office visit.  1: Prostate cancer-followed by urology.  No significant recurrence.  Does have residual incontinence and erectile dysfunction.  2: Renal cell carcinoma-status post surgery.  No evidence of any recurrence.  Did have recent CT scans.  3: Lower abdominal pain-her medically improved since recent cystoscopy and removal of bladder stones.  4: Atrial fibrillation-has been evaluated thoroughly by cardiology.  He wants to discuss the pros and cons of doing an ablation.  He does have intermittent hematuria and is currently on anticoagulation.  Perhaps, if he has ablation he can get off anticoagulation and avoid this difficulty.    Medications reviewed and updated in EMR  List reflects end of visit medication list.  Problem list updated.  Current Outpatient Prescriptions   Medication Sig Dispense Refill    tamsulosin (FLOMAX) 0.4 MG capsule Take 0.4 mg by mouth every evening   In AM & only PRN      atorvastatin (LIPITOR) 20 MG tablet       metoprolol (TOPROL-XL) 100 MG 24 hr tablet Take 50 mg by mouth daily         TIKOSYN 500 MCG capsule 500 mcg 2 times daily         Multiple Vitamin (MULTIVITAMIN) per tablet Take 1 tablet by mouth daily       No current facility-administered medications for this visit.      Allergies---  Food  Patient Active Problem List   Diagnosis Code    Prostate cancer C61    Renal cell cancer C64.9    Male Erectile Disorder F52.8    Arthritis M12.9    Nephrolithiasis N20.0    Post Prostatectomy Z98.89    Other and unspecified hyperlipidemia E78.5    Atrial fibrillation I48.91     Body mass index is 32.01 kg/m.  Blood pressure 110/84, pulse 73, temperature 36.5 C (97.7 F), temperature source Temporal, height 1.904 m (6' 2.96"), weight 116 kg (255 lb 12.8 oz), SpO2 97 %.      Pleasant gentleman is moderately obese.  Skin warm and dry.  No carotid bruits.  Lungs are clear.  No rales or rhonchi.  No  wheezing.  Cardiac exam reveals an irregular rhythm.  Soft systolic murmur.  No S3 gallop.  Abdomen is soft and nontender.  No masses.  No organomegaly.  Trace pedal edema bilaterally.    Pleasant gentleman comes to our office today for follow-up.  All of his recent testing including scans.  I encouraged him to go back to cardiology to consider ablation.  Otherwise, he is up-to-date with a specialist.  I did not change any of his medications today.

## 2017-07-29 ENCOUNTER — Encounter: Payer: Self-pay | Admitting: Gastroenterology

## 2017-09-04 ENCOUNTER — Telehealth: Payer: Self-pay | Admitting: Primary Care

## 2017-09-04 MED ORDER — AMOXICILLIN-POT CLAVULANATE 875-125 MG PO TABS *I*
1.0000 | ORAL_TABLET | Freq: Two times a day (BID) | ORAL | 0 refills | Status: DC
Start: 2017-09-04 — End: 2017-11-27

## 2017-09-04 NOTE — Telephone Encounter (Signed)
Pt is ON the augmentin X9 dys  It's not work, but not cipro.

## 2017-09-04 NOTE — Telephone Encounter (Signed)
Maxwell Wells had a kidney stone which caused a UTI. Maxwell Wells  Was prescribed a 10 day supply of Augmentin. He can't take Cipro. Can you send in an alternative antibiotic? The urgent cares that he has tried to go to don't take his insurance.Let [name] know what you'll prescribe.

## 2017-09-04 NOTE — Telephone Encounter (Signed)
snt in augmentin but if he starts running fever or gets shaking chills should go to ED down there in [place]

## 2017-09-04 NOTE — Telephone Encounter (Signed)
No then he needs to get a culture either from a doc down there or an urgent care

## 2017-09-04 NOTE — Telephone Encounter (Signed)
Spoke to pt  He says he already went that route.  Ok thank you.

## 2017-09-08 NOTE — Telephone Encounter (Signed)
The patient called stating that he will be having a procedure done this week in [place].  The patient is going to check with his insurance to see if he needs to get an out of area referral.

## 2017-09-09 ENCOUNTER — Encounter: Payer: Self-pay | Admitting: Gastroenterology

## 2017-10-03 ENCOUNTER — Encounter: Payer: Self-pay | Admitting: Gastroenterology

## 2017-10-07 ENCOUNTER — Telehealth: Payer: Self-pay | Admitting: Primary Care

## 2017-10-07 NOTE — Telephone Encounter (Signed)
Maxwell Wells at [facility] was given pending auth number noted below.

## 2017-10-07 NOTE — Telephone Encounter (Signed)
Clinical information provided by [facility] faxed into Utilization management @ [phone], marked URGENT, appointment date of 10/14/2017; Pending authorization # MR [id number].

## 2017-10-07 NOTE — Telephone Encounter (Signed)
Clarene Critchley will be faxing request for Out-Of Area referral to [phone].

## 2017-10-07 NOTE — Telephone Encounter (Signed)
ok 

## 2017-10-07 NOTE — Telephone Encounter (Signed)
Patient needs an insurance referral:    Doctor: Dr Kristopher Glee [facility] (Outpatient) [place], [place] [phone]; [phone]   NPI: [id number](Maxwell Wells) NPIRiki Rusk- [id number]     Diagnosis: W88.8XXS; L59.8; N30.41; [id number] for 1 preliminary visit     Date of Service: 10/14/2017     Insurance Number: [id number]

## 2017-10-10 NOTE — Telephone Encounter (Signed)
Pending Josem Kaufmann was documented incorrectly. Correct number is MR [id number]. This Out of Area referral has been approved, effective 10/14/17- 04/16/18 for 1 visit.

## 2017-10-10 NOTE — Telephone Encounter (Addendum)
Correct Approved Out-of-Area referral # is [id number]. Manfred Arch at [place] [facility] al was given referral approval. This is for initial consult. Clarene Critchley may be contacting office for Out-of-Area referral for procedure is necessary.

## 2017-10-30 ENCOUNTER — Encounter: Payer: Self-pay | Admitting: Gastroenterology

## 2017-11-03 ENCOUNTER — Other Ambulatory Visit: Payer: Self-pay | Admitting: Gastroenterology

## 2017-11-25 ENCOUNTER — Encounter: Payer: Self-pay | Admitting: Primary Care

## 2017-11-25 ENCOUNTER — Ambulatory Visit: Payer: Medicare (Managed Care) | Attending: Primary Care | Admitting: Primary Care

## 2017-11-25 VITALS — BP 118/64 | HR 42 | Temp 98.5°F | Ht 74.92 in | Wt 247.0 lb

## 2017-11-25 DIAGNOSIS — R310 Gross hematuria: Secondary | ICD-10-CM

## 2017-11-25 DIAGNOSIS — I10 Essential (primary) hypertension: Secondary | ICD-10-CM

## 2017-11-25 DIAGNOSIS — I480 Paroxysmal atrial fibrillation: Secondary | ICD-10-CM

## 2017-11-27 ENCOUNTER — Encounter: Payer: Self-pay | Admitting: Primary Care

## 2017-11-27 NOTE — NoShare Progress Note (Signed)
Very pleasant 72 year old gentleman comes to our office today for a routine office visit.  He recently returned from spending the winter in [place].  He says that was a nightmare.  He had almost weekly gross hematuria with clogging of his urethra resulting in urinary retention.  He was in and out of the emergency room getting catheterized and irrigated.  He has not been on his anticoagulation therapy.  He has a history of paroxysmal atrial fibrillation but no strokes.  He is tentatively scheduled for an ablation procedure next month.  Flulike to discuss this.  He states he would like to get off his antiarrhythmics and anticoagulation therapy if possible.  Over the last 3 weeks he's not had any episodes of gross hematuria.  He feels much better about this.  No signs of infection.  Otherwise his been through quite a bit.  Both his brother and mother died recently.    Medications reviewed and updated in EMR  List reflects end of visit medication list.  Problem list updated.  Current Outpatient Prescriptions   Medication Sig Dispense Refill    tamsulosin (FLOMAX) 0.4 MG capsule Take 0.4 mg by mouth every evening   In AM & only PRN      atorvastatin (LIPITOR) 20 MG tablet       metoprolol (TOPROL-XL) 100 MG 24 hr tablet Take 50 mg by mouth daily         TIKOSYN 500 MCG capsule 500 mcg 2 times daily         Multiple Vitamin (MULTIVITAMIN) per tablet Take 1 tablet by mouth daily       No current facility-administered medications for this visit.      Allergies---  Food  Patient Active Problem List   Diagnosis Code    Prostate cancer C61    Renal cell cancer C64.9    Male Erectile Disorder F52.8    Arthritis M12.9    Nephrolithiasis N20.0    Post Prostatectomy Z98.89    Other and unspecified hyperlipidemia E78.5    Atrial fibrillation I48.91     Body mass index is 30.94 kg/m.  Blood pressure 118/64, pulse (!) 42, temperature 36.9 C (98.5 F), height 1.903 m (6' 2.92"), weight 112 kg (247 lb), SpO2 97  %.      Pleasant gentleman in no apparent distress.  Skin warm and dry with actinic changes present.  No suspicious lesions.  No JVD.  No carotid bruits.  Lungs are clear.  No rales or rhonchi.  No wheezing.  No CVA tenderness.  Cardiac exam reveals a irregular rhythm.  No murmur rub or gallop.  No edema.    She 30-year-old gentleman with a complicated medical history comes to our office today for follow-up.  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  Unfortunately, the patient has developed urinary retention perhaps secondary to a urethral narrowing.  We will go back on anticoagulation therapy.  He will go through with his ablation for atrial fibrillation.  He has follow-up with urology scheduled.  All these were reviewed with patient.

## 2017-12-03 ENCOUNTER — Other Ambulatory Visit: Payer: Self-pay

## 2017-12-03 LAB — COMPREHENSIVE METABOLIC PANEL
A/G RATIO: 1.7 (ref 1.1–1.8)
ALT: 28 U/L (ref 10–49)
AST: 19 U/L (ref 7–37)
Albumin: 4 g/dL (ref 3.2–4.8)
Alk Phos: 61 U/L (ref 46–116)
Anion Gap: 7 mEq/L (ref 4–16)
Bilirubin,Total: 1.1 mg/dL (ref 0.3–1.2)
CO2: 24 mEq/L (ref 20–31)
Calcium: 8.8 mg/dL (ref 8.5–10.4)
Chloride: 108 mEq/L (ref 98–108)
Creatinine: 0.9 mg/dL (ref 0.7–1.2)
Globulin: 2.4 g/dL (ref 2.4–4.3)
Glucose: 112 mg/dL — ABNORMAL HIGH (ref 65–100)
Lab: 18 mg/dL (ref 8–20)
Potassium: 4.5 mEq/L (ref 3.5–5.1)
Sodium: 139 mEq/L (ref 135–145)
Total Protein: 6.4 g/dL (ref 6.4–8.5)

## 2017-12-03 LAB — CBC AND DIFFERENTIAL
Baso # K/uL: 0.1 10*3/uL (ref 0.0–0.2)
Basophil %: 1 % (ref 0–3)
Eos # K/uL: 0.1 10*3/uL (ref 0.0–0.6)
Eosinophil %: 2 % (ref 0–5)
Hematocrit: 45 % (ref 40–52)
Hemoglobin: 15.6 g/dL (ref 13.0–18.0)
Lymph # K/uL: 1.1 10*3/uL (ref 1.0–4.8)
Lymphocyte %: 15 % (ref 15–45)
MCH: 32.6 pg (ref 26.0–34.0)
MCHC: 34.7 g/dL (ref 32.0–37.5)
MCV: 94 fL (ref 80–100)
Mono # K/uL: 0.7 10*3/uL (ref 0.1–1.0)
Monocyte %: 10 % (ref 0–15)
Neut # K/uL: 5 10*3/uL (ref 1.8–8.0)
Platelets: 275 10*3/uL (ref 150–450)
RBC: 4.78 10*6/uL (ref 4.40–6.20)
RDW: 13 % (ref 0.0–15.2)
Seg Neut %: 72 % (ref 45–75)
WBC: 6.9 10*3/uL (ref 4.0–11.0)

## 2017-12-03 LAB — URINALYSIS REFLEX TO CULTURE
Nitrite,UA: POSITIVE — AB
PH,Ur: 6.5 (ref 5.0–8.0)
Specific Gravity,UA: 1.015 (ref 1.005–1.030)

## 2017-12-03 LAB — UNMAPPED LAB RESULTS
ABO RH Blood Type (HT): O NEG — NL
Antibody Screen (HT): NEGATIVE — NL
Basophil # (HT): 0.1 10 3/uL — NL (ref 0.0–0.2)
Basophil % (HT): 1 % — NL (ref 0–3)
Eosinophil # (HT): 0.1 10 3/uL — NL (ref 0.0–0.6)
Eosinophil % (HT): 2 % — NL (ref 0–5)
Hematocrit (HT): 45 % — NL (ref 40–52)
Hemoglobin (HGB) (HT): 15.6 g/dL — NL (ref 13.0–18.0)
Lymphocyte # (HT): 1.1 10 3/uL — NL (ref 1.0–4.8)
Lymphocyte % (HT): 15 % — NL (ref 15–45)
MCHC (HT): 34.7 g/dL — NL (ref 32.0–37.5)
MCV (HT): 94 fL — NL (ref 80–100)
Mean Corpuscular Hemoglobin (MCH) (HT): 32.6 pg — NL (ref 26.0–34.0)
Monocyte # (HT): 0.7 10 3/uL — NL (ref 0.1–1.0)
Monocyte % (HT): 10 % — NL (ref 0–15)
Neutrophil # (HT): 5 10 3/uL — NL (ref 1.8–8.0)
Platelets (HT): 275 10 3/uL — NL (ref 150–450)
RBC (HT): 4.78 10 6/uL — NL (ref 4.40–6.20)
RDW (HT): 13 % — NL (ref 0.0–15.2)
Seg Neut % (HT): 72 % — NL (ref 45–75)
WBC (HT): 6.9 10 3/uL — NL (ref 4.0–11.0)

## 2017-12-03 LAB — ESTIMATED GFR
GFR,Black: >60 mL/min
GFR,Caucasian: >60 mL/min

## 2017-12-03 LAB — TYPE AND SCREEN
ABO RH Blood Type: O NEG
Antibody Screen: NEGATIVE

## 2017-12-03 LAB — URINE MICROSCOPIC (IQ200)

## 2017-12-04 ENCOUNTER — Other Ambulatory Visit: Payer: Self-pay | Admitting: Urology

## 2017-12-05 ENCOUNTER — Other Ambulatory Visit: Payer: Self-pay | Admitting: Gastroenterology

## 2017-12-05 ENCOUNTER — Other Ambulatory Visit: Payer: Self-pay | Admitting: Urology

## 2017-12-05 LAB — AEROBIC CULTURE

## 2017-12-05 LAB — MEDICAL CYTOLOGY

## 2017-12-05 LAB — PSA (EFF.4-2010): PSA (eff. 4-2010): 0.03 ng/mL (ref 0.00–4.00)

## 2017-12-18 ENCOUNTER — Encounter: Payer: Self-pay | Admitting: Gastroenterology

## 2017-12-26 ENCOUNTER — Encounter: Payer: Self-pay | Admitting: Gastroenterology

## 2017-12-30 ENCOUNTER — Other Ambulatory Visit: Payer: Self-pay | Admitting: Urology

## 2018-01-02 LAB — AEROBIC CULTURE

## 2018-01-06 ENCOUNTER — Encounter: Payer: Self-pay | Admitting: Gastroenterology

## 2018-03-21 ENCOUNTER — Other Ambulatory Visit
Admission: RE | Admit: 2018-03-21 | Discharge: 2018-03-21 | Disposition: A | Discharge status: Home / Self Care | Payer: Medicare (Managed Care) | Source: Ambulatory Visit | Attending: Oncology | Admitting: Oncology

## 2018-03-21 DIAGNOSIS — C61 Malignant neoplasm of prostate: Secondary | ICD-10-CM | POA: Insufficient documentation

## 2018-03-21 LAB — TESTOSTERONE: Testosterone: 169 ng/dL — ABNORMAL LOW (ref 193–740)

## 2018-03-21 LAB — CBC AND DIFFERENTIAL
Baso # K/uL: 0.1 10*3/uL (ref 0.0–0.1)
Basophil %: 0.8 %
Eos # K/uL: 0.2 10*3/uL (ref 0.0–0.5)
Eosinophil %: 3.3 %
Hematocrit: 44 % (ref 40–51)
Hemoglobin: 14.6 g/dL (ref 13.7–17.5)
IMM Granulocytes #: 0 10*3/uL
IMM Granulocytes: 0.6 %
Lymph # K/uL: 1.4 10*3/uL (ref 1.3–3.6)
Lymphocyte %: 22.5 %
MCH: 33 pg/cell — ABNORMAL HIGH (ref 26–32)
MCHC: 33 g/dL (ref 32–37)
MCV: 101 fL — ABNORMAL HIGH (ref 79–92)
Mono # K/uL: 0.6 10*3/uL (ref 0.3–0.8)
Monocyte %: 10 %
Neut # K/uL: 4 10*3/uL (ref 1.8–5.4)
Nucl RBC # K/uL: 0 10*3/uL (ref 0.0–0.0)
Nucl RBC %: 0 /100 WBC (ref 0.0–0.2)
Platelets: 264 10*3/uL (ref 150–330)
RBC: 4.4 MIL/uL — ABNORMAL LOW (ref 4.6–6.1)
RDW: 13.2 % (ref 11.6–14.4)
Seg Neut %: 62.8 %
WBC: 6.3 10*3/uL (ref 4.2–9.1)

## 2018-03-21 LAB — COMPREHENSIVE METABOLIC PANEL
ALT: 31 U/L (ref 0–50)
AST: 20 U/L (ref 0–50)
Albumin: 4.2 g/dL (ref 3.5–5.2)
Alk Phos: 72 U/L (ref 40–130)
Anion Gap: 13 (ref 7–16)
Bilirubin,Total: 1 mg/dL (ref 0.0–1.2)
CO2: 24 mmol/L (ref 20–28)
Calcium: 9.2 mg/dL (ref 8.6–10.2)
Chloride: 103 mmol/L (ref 96–108)
Creatinine: 0.92 mg/dL (ref 0.67–1.17)
GFR,Black: 97 *
GFR,Caucasian: 84 *
Glucose: 124 mg/dL — ABNORMAL HIGH (ref 60–99)
Lab: 20 mg/dL (ref 6–20)
Potassium: 4.4 mmol/L (ref 3.3–5.1)
Sodium: 140 mmol/L (ref 133–145)
Total Protein: 6.5 g/dL (ref 6.3–7.7)

## 2018-03-21 LAB — NEUTROPHIL #-INSTRUMENT: Neutrophil #-Instrument: 4 10*3/uL

## 2018-03-21 LAB — PSA (EFF.4-2010): PSA (eff. 4-2010): <0.02 ng/mL (ref 0.00–4.00)

## 2018-03-26 ENCOUNTER — Encounter: Payer: Self-pay | Admitting: Oncology

## 2018-03-26 ENCOUNTER — Ambulatory Visit: Payer: Medicare (Managed Care) | Attending: Oncology | Admitting: Oncology

## 2018-03-26 VITALS — BP 124/63 | HR 78 | Temp 97.7°F | Resp 18 | Wt 256.7 lb

## 2018-03-26 DIAGNOSIS — C649 Malignant neoplasm of unspecified kidney, except renal pelvis: Secondary | ICD-10-CM | POA: Insufficient documentation

## 2018-03-26 DIAGNOSIS — C61 Malignant neoplasm of prostate: Secondary | ICD-10-CM | POA: Insufficient documentation

## 2018-03-26 DIAGNOSIS — N200 Calculus of kidney: Secondary | ICD-10-CM | POA: Insufficient documentation

## 2018-03-29 NOTE — Progress Notes (Signed)
Subjective:      Patient ID: Rush Salce is a 72 y.o. male who returns in follow up of prostate- and renal cell cancers.     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 He received radiotherapy in April and May of 2005. He was on ADT for detectable and rising PSA. The ADT was discontinued in August 2007 due to hot flashes. He has been followed without treatment since then. His PSA had remained at the limit of detection. He had a CT scan as part of workup for recurrent kidney stones. It showed a 1.9 x 1.9 cm left renal mass. He underwent a laparoscopic partial nephrectomy in October 2010. Pathology report indicated that the mass had been removed in multiple fragments. 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 Margins could not be assessed and size of the tumor could not be determined. He is being followed without further intervention. He returns for a scheduled follow-up visit.       Interval History and Review of Systems: Mr. Hegeman spent the winter in [place]. The stay was marred multiple episodes of hematuria ("every other Saturday") and many trip to the ED. 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 The hematuria was made worse by him being on anticoagulation due to a fib. He is off anticoagulants now. Underwent hyperbaric oxygen therapy for 8 weeks. Completed two weeks ago, One episode of hematuria since starting starting the hyperbaric oxygen therapy.   He is drinking plenty of fluids in view of recurrent urinary calculi. However, the fluid intake has worsened nocturia which interferes with sleep.   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 He was hoping to have the knee replaced but cannot do so as he is not abel to take anti-coaluants due to hematuria.    Overall, Mr. Eisenhardt was understandable frustrated.     Current  Outpatient Prescriptions   Medication    cefuroxime (CEFTIN) 250 MG tablet    loperamide (IMODIUM) 2 MG capsule    atorvastatin (LIPITOR) 20 MG tablet    metoprolol (TOPROL-XL) 100 MG 24 hr tablet    TIKOSYN 500 MCG capsule    Multiple Vitamin (MULTIVITAMIN) per tablet     No current facility-administered medications for this visit.          Objective:     Vitals:    03/26/18 1549   BP: 124/63   Pulse: 78   Resp: 18   Temp: 36.5 C (97.7 F)   Weight: 116.4 kg (256 lb 11.6 oz)     Diagnostics: From 03/21/2018: Creatinine 0.92. Notrmal LFTs. PSA <0.02 and testosterone 169.     Physical Exam  Constitutional: He is oriented to person, place, and time. He appears well-developed and well-nourished.   Eyes: No scleral icterus.   Musculoskeletal: well-healed surgical scar over the right knee.   Neurological: He is alert and oriented to person, place, and time.  Psychiatric: Seemed frustrated    Assessment:     MR. B is a 44 -year-old with prostate cancer and undetectable PSA. He remains without clinical or biochemical evidence of his prostate cancer. He follows with [name] for his renal cell cancer.   Recurrent hematuria over the winter while in [place]. Hematuria very likely multifactorial in etiology with cystitis, calculi and anti-coagulant contributing. Off anticoagulation and just completed  hyperbaric oxygen therapy. Both would be expected to help.  Mr. Arscott gave h/o multiply recurrent calculi "from the stuff that causes gout". He also indicated that many first degree relatives have had urinary calculi. He will need to have serum urate and urinary sediment examined, particularly during episode.   It is possible that he has a heritable disorder of urate metabolism. This will require detailed family history and study.  He indicated that the "usual medicine" for gout, presumably allopurinol, caused liver toxicity. Acetazolamide, a carbonic anhydrase inhibitor, which predictably alkalinizes urine, may be of  help. However, it can increase the risk of developing calcium phosphate stone. A referral to Nephrology was offered/discussed.     Plan:     1. We will continue to monitor him without intervention for the prostate cancer. RTC in one year with PSA and testosterone.  2. He will follow up with [name] for his recurrent RCC.     Mr.B was urged to call if there are any questions, concerns or new symptoms before that scheduled clinic visit.

## 2018-04-20 ENCOUNTER — Encounter: Payer: Self-pay | Admitting: Gastroenterology

## 2018-04-23 ENCOUNTER — Encounter: Payer: Self-pay | Admitting: Gastroenterology

## 2018-05-05 ENCOUNTER — Encounter: Payer: Self-pay | Admitting: Gastroenterology

## 2018-05-19 ENCOUNTER — Ambulatory Visit: Payer: Medicare (Managed Care)

## 2018-10-06 IMAGING — CT CT ABDOMEN AND PELVIS WITHOUT CONTRAST
2 of 3 series · 16 of 46 positions shown, 18 images · non-contrast
Comparison: There are no previous exams available for comparison.

CT ABDOMEN AND PELVIS WITHOUT CONTRAST, 10/06/2018 [DATE]: 
CLINICAL INDICATION:  History of kidney stones. History of prostatectomy in 5332 
and left renal cancer in 1717. 
A search for DICOM formatted images was conducted for prior CT imaging studies 
completed at a non-affiliated media free facility.
TECHNIQUE: The abdomen and pelvis were scanned from lung bases through the 
pubic rami without contrast on a high-resolution CT scanner using dose reduction 
techniques..  Routine MPR reconstructions were performed.

[Series 2: abd/pel ax wo · axial · 0.88mm/px · z∈[-586,-169]mm · 13 of 161 slices shown, 15 images]
[im 11/161  soft-tissue]
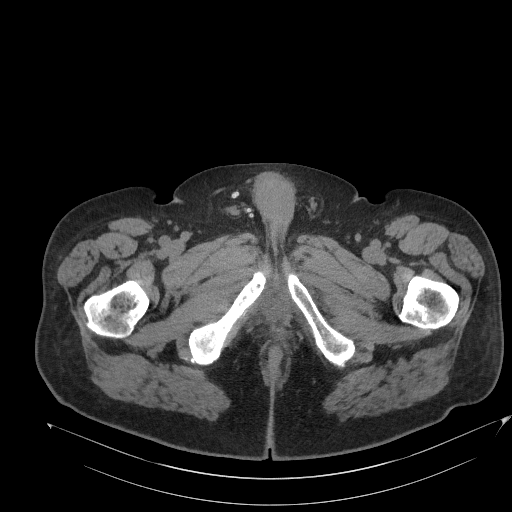
[im 11/161  bone]
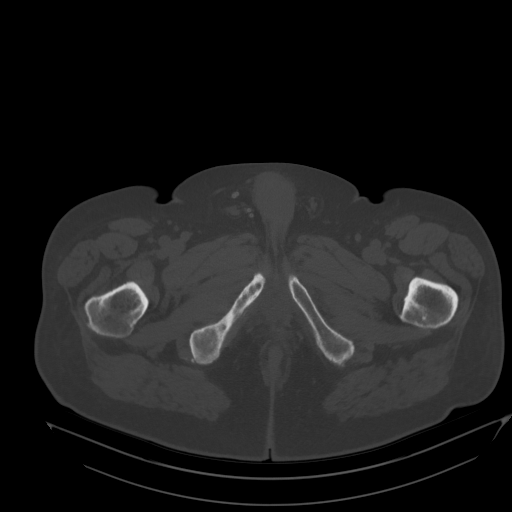
[im 21/161  soft-tissue]
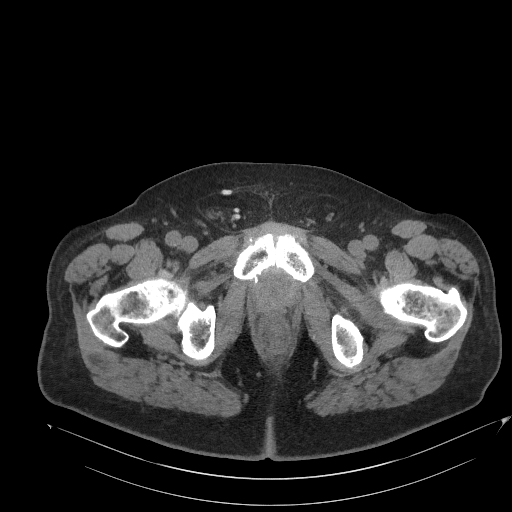
[im 31/161  soft-tissue]
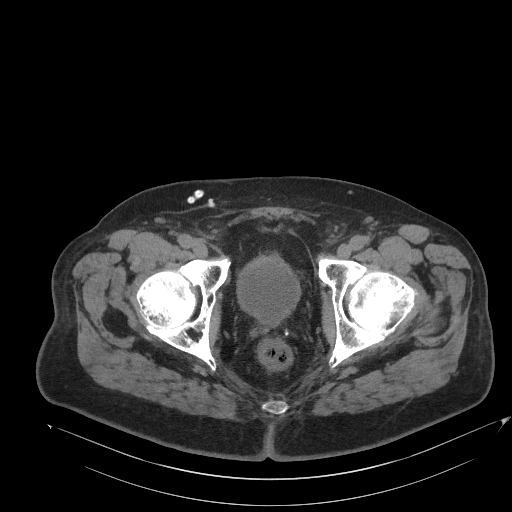
[im 47/161  soft-tissue]
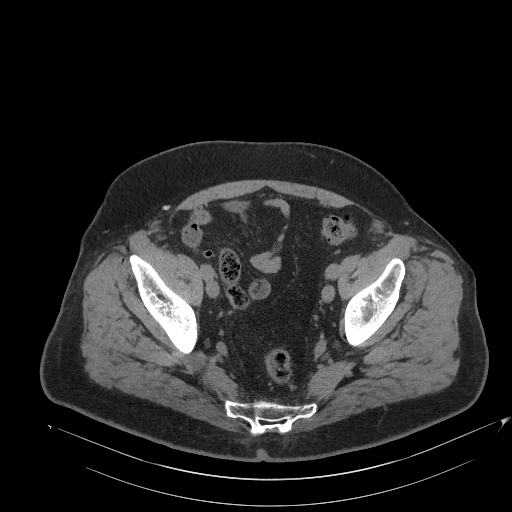
[im 57/161  soft-tissue]
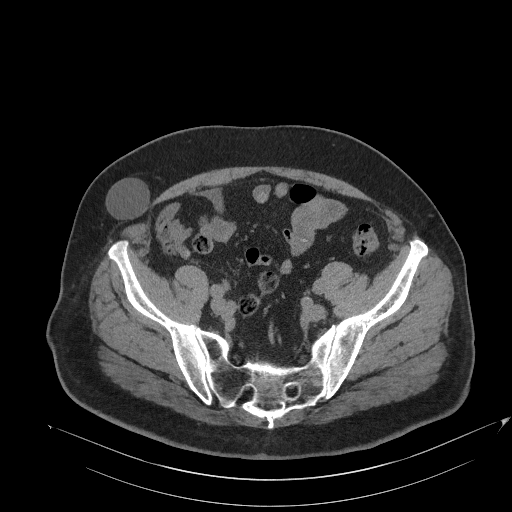
[im 68/161  soft-tissue]
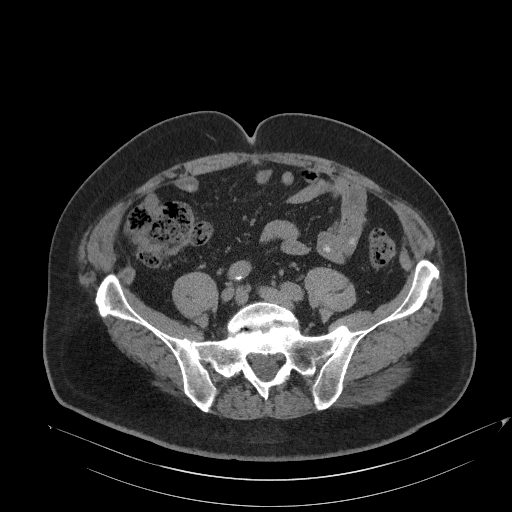
[im 83/161  soft-tissue]
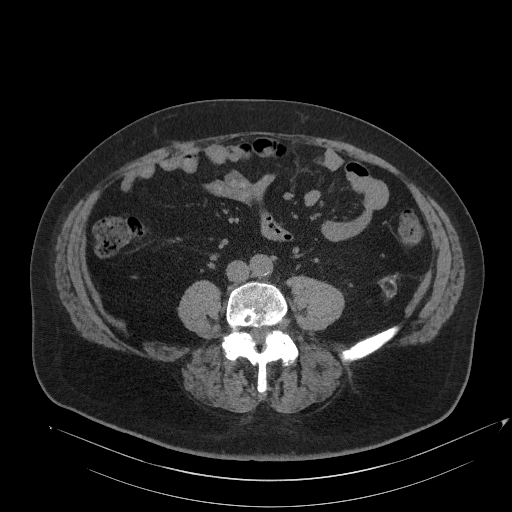
[im 93/161  soft-tissue]
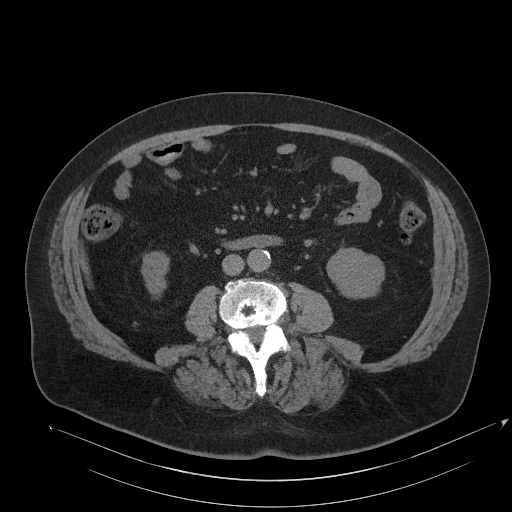
[im 104/161  soft-tissue]
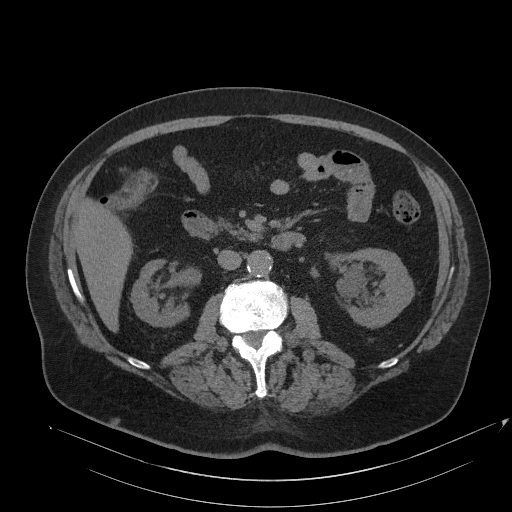
[im 104/161  bone]
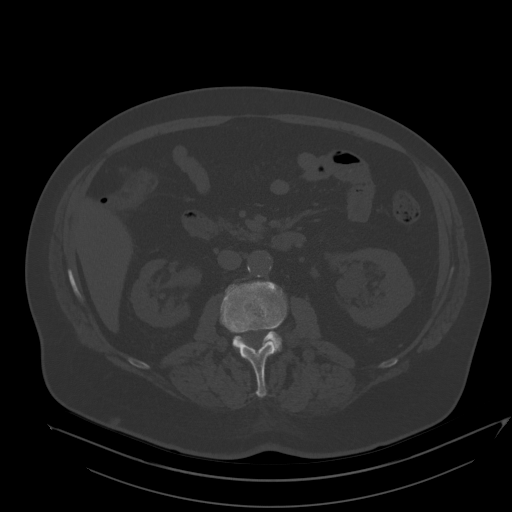
[im 114/161  soft-tissue]
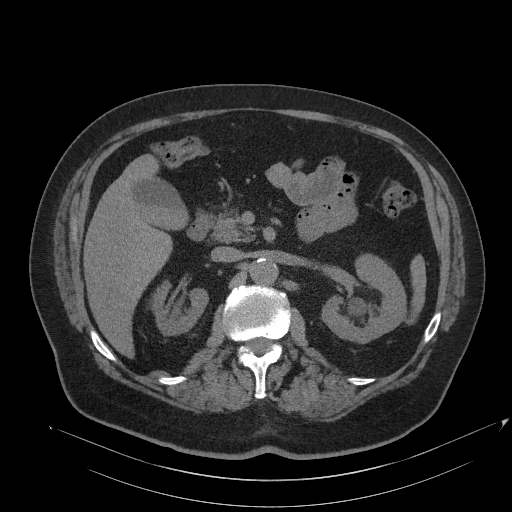
[im 130/161  soft-tissue]
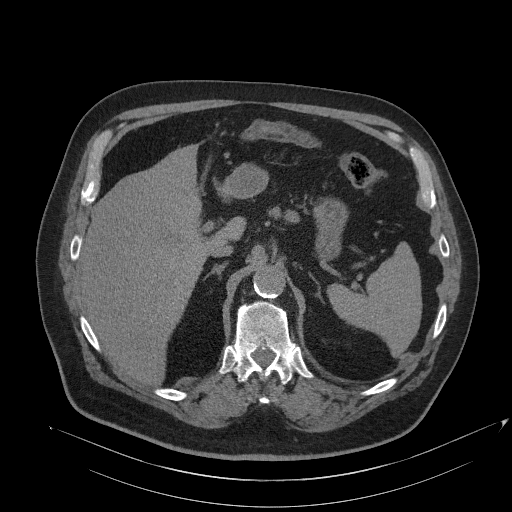
[im 140/161  soft-tissue]
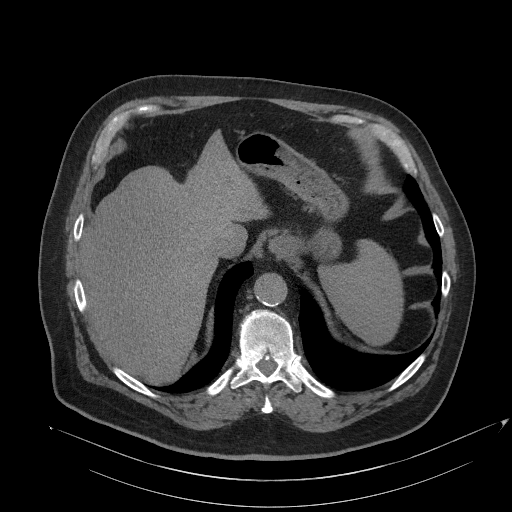
[im 150/161  soft-tissue]
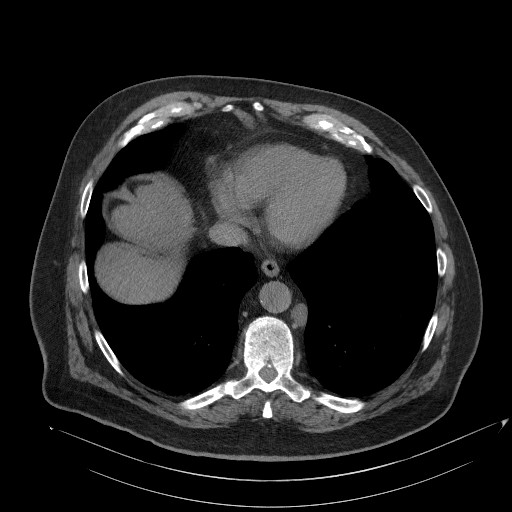

[Series 3: abd/pel cor wo · coronal · 0.83mm/px · 3 of 154 slices shown]
[im 52/154  soft-tissue]
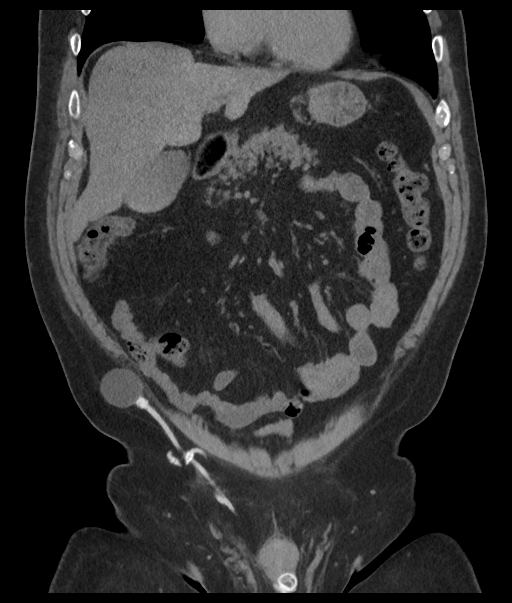
[im 69/154  soft-tissue]
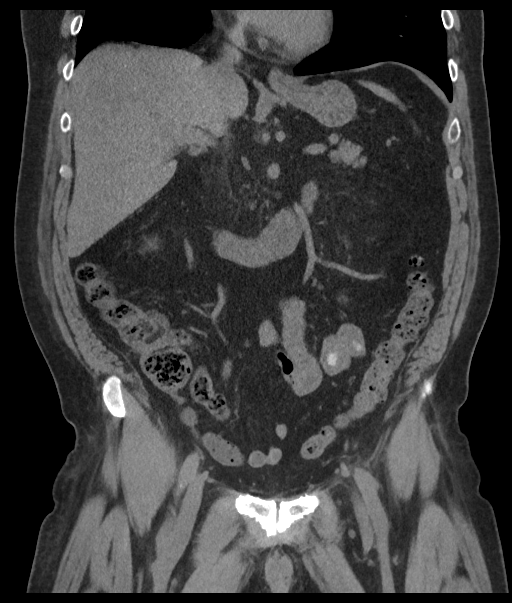
[im 86/154  soft-tissue]
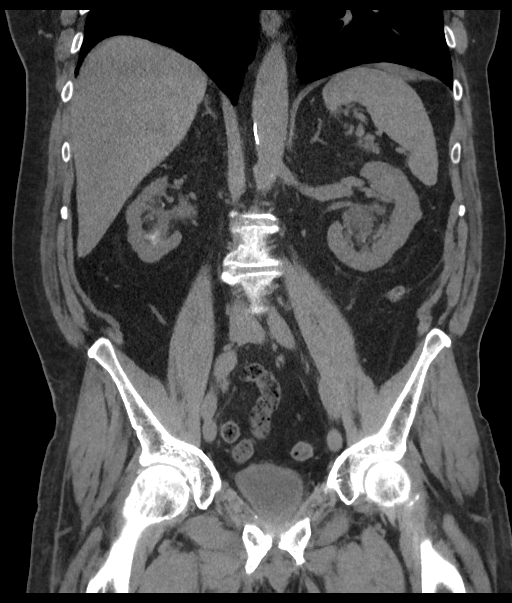

[16 of 46 positions shown; findings below may reference images not displayed]

FINDINGS: The lung bases are clear. There is soft tissue adjacent to the 
descending thoracic aorta on axial image 9. This measures 20 x 15 mm. It is 
relatively low density could represent fluid collection or cyst in this region. 
This would be better evaluated either with MRI or CT scan with and without 
contrast. The liver, spleen, pancreas and adrenal glands are normal in 
appearance. Cholelithiasis in a nondistended gallbladder. Bilateral parapelvic 
cyst are seen. There appears to be treatment changes superior pole left kidney 
most likely from previous RF ablation. There is a calculus inferior pole left 
kidney measuring 4 mm in diameter with average Hounsfield units of approximately 
230. There is poorly defined increased density in the inferior pole right kidney 
measuring up to 10 mm. This could represent multiple fragmented stones with 
maximum density of only 120. Atherosclerotic change and degenerative changes. 
Normal appendix. No ureteral calculus seen. Bladder is unremarkable in 
appearance. Penile prosthesis in place.
IMPRESSION: Bilateral calculi. On the left this is a standard appearing calculus and on the 
right this is has the appearance more suggestive of fragmented multiple stones 
or very low density stone. Bilateral renal cyst are seen as well as probable 
changes of ablation on the left. 
Cholelithiasis, atherosclerotic changes and degenerative changes. 
Nodule versus cyst adjacent to the descending thoracic aorta. Nonspecific on 
this noncontrast study. If prior films should become available a direct 
comparison could be made. Otherwise this could be further evaluated with 
postcontrast CT or MRI. 
RADIATION DOSE REDUCTION: All CT scans are performed using radiation dose 
reduction techniques, when applicable.  Technical factors are evaluated and 
adjusted to ensure appropriate moderation of exposure.  Automated dose 
management technology is applied to adjust the radiation doses to minimize 
exposure while achieving diagnostic quality images.

## 2019-01-11 ENCOUNTER — Other Ambulatory Visit: Payer: Self-pay | Admitting: Gastroenterology

## 2019-01-18 ENCOUNTER — Other Ambulatory Visit
Admission: RE | Admit: 2019-01-18 | Discharge: 2019-01-18 | Disposition: A | Discharge status: Home / Self Care | Payer: Medicare (Managed Care) | Source: Ambulatory Visit | Attending: Oncology | Admitting: Oncology

## 2019-01-18 DIAGNOSIS — C61 Malignant neoplasm of prostate: Secondary | ICD-10-CM | POA: Insufficient documentation

## 2019-01-18 LAB — CBC AND DIFFERENTIAL
Baso # K/uL: 0 10*3/uL (ref 0.0–0.1)
Basophil %: 0.6 %
Eos # K/uL: 0.1 10*3/uL (ref 0.0–0.5)
Eosinophil %: 1.1 %
Hematocrit: 45 % (ref 40–51)
Hemoglobin: 15.4 g/dL (ref 13.7–17.5)
IMM Granulocytes #: 0 10*3/uL (ref 0.0–0.0)
IMM Granulocytes: 0.3 %
Lymph # K/uL: 1.7 10*3/uL (ref 1.3–3.6)
Lymphocyte %: 26.1 %
MCH: 33 pg/cell — ABNORMAL HIGH (ref 26–32)
MCHC: 34 g/dL (ref 32–37)
MCV: 96 fL — ABNORMAL HIGH (ref 79–92)
Mono # K/uL: 0.8 10*3/uL (ref 0.3–0.8)
Monocyte %: 11.7 %
Neut # K/uL: 4 10*3/uL (ref 1.8–5.4)
Nucl RBC # K/uL: 0 10*3/uL (ref 0.0–0.0)
Nucl RBC %: 0 /100 WBC (ref 0.0–0.2)
Platelets: 270 10*3/uL (ref 150–330)
RBC: 4.7 MIL/uL (ref 4.6–6.1)
RDW: 12.8 % (ref 11.6–14.4)
Seg Neut %: 60.2 %
WBC: 6.6 10*3/uL (ref 4.2–9.1)

## 2019-01-18 LAB — COMPREHENSIVE METABOLIC PANEL
ALT: 25 U/L (ref 0–50)
AST: 20 U/L (ref 0–50)
Albumin: 4.3 g/dL (ref 3.5–5.2)
Alk Phos: 74 U/L (ref 40–130)
Anion Gap: 15 (ref 7–16)
Bilirubin,Total: 0.9 mg/dL (ref 0.0–1.2)
CO2: 22 mmol/L (ref 20–28)
Calcium: 9.3 mg/dL (ref 8.6–10.2)
Chloride: 103 mmol/L (ref 96–108)
Creatinine: 1.07 mg/dL (ref 0.67–1.17)
GFR,Black: 80 *
GFR,Caucasian: 69 *
Glucose: 112 mg/dL — ABNORMAL HIGH (ref 60–99)
Lab: 21 mg/dL — ABNORMAL HIGH (ref 6–20)
Potassium: 5 mmol/L (ref 3.3–5.1)
Sodium: 140 mmol/L (ref 133–145)
Total Protein: 6.7 g/dL (ref 6.3–7.7)

## 2019-01-18 LAB — PSA (EFF.4-2010): PSA (eff. 4-2010): <0.02 ng/mL (ref 0.00–4.00)

## 2019-01-18 LAB — NEUTROPHIL #-INSTRUMENT: Neutrophil #-Instrument: 4 10*3/uL

## 2019-01-18 LAB — TESTOSTERONE: Testosterone: 249 ng/dL (ref 193–740)

## 2019-01-20 ENCOUNTER — Telehealth: Payer: Self-pay | Admitting: Primary Care

## 2019-01-20 NOTE — Telephone Encounter (Signed)
Called pt  appt made for a phy in Sept

## 2019-01-20 NOTE — Telephone Encounter (Signed)
-----   Message from Blythe Stanford, MD sent at 01/19/2019  6:19 AM EDT -----  Regarding: appt  Hasn't been in for a while--please schedule fall CPE  ----- Message -----  From: Edi, [facility]  Sent: 01/18/2019   3:37 PM EDT  To: Blythe Stanford, MD

## 2019-02-25 ENCOUNTER — Ambulatory Visit
Admission: AD | Admit: 2019-02-25 | Discharge: 2019-02-25 | Disposition: A | Discharge status: Home / Self Care | Payer: Medicare (Managed Care) | Source: Ambulatory Visit | Attending: Emergency Medicine | Admitting: Emergency Medicine

## 2019-02-25 DIAGNOSIS — Z20828 Contact with and (suspected) exposure to other viral communicable diseases: Secondary | ICD-10-CM

## 2019-02-25 DIAGNOSIS — Z1159 Encounter for screening for other viral diseases: Secondary | ICD-10-CM | POA: Insufficient documentation

## 2019-02-25 DIAGNOSIS — Z01812 Encounter for preprocedural laboratory examination: Secondary | ICD-10-CM

## 2019-02-25 LAB — COVID-19 NAAT (PCR): COVID-19 NAAT (PCR): 0

## 2019-02-25 NOTE — ED Triage Notes (Signed)
Patient presenting to Urgent Care for testing only. Outside provider ordered and referred patient for COVID-19 test.   Reason for test: Preprocedural Testing  NP swab obtained and sent for analysis.         Triage Note   Cayle Thunder, RN

## 2019-03-11 ENCOUNTER — Ambulatory Visit
Admission: AD | Admit: 2019-03-11 | Discharge: 2019-03-11 | Disposition: A | Discharge status: Home / Self Care | Payer: Medicare (Managed Care) | Source: Ambulatory Visit

## 2019-03-11 ENCOUNTER — Other Ambulatory Visit
Admission: RE | Admit: 2019-03-11 | Discharge: 2019-03-11 | Disposition: A | Discharge status: Home / Self Care | Payer: Medicare (Managed Care) | Source: Ambulatory Visit | Attending: General Practice | Admitting: General Practice

## 2019-03-11 DIAGNOSIS — Z01812 Encounter for preprocedural laboratory examination: Secondary | ICD-10-CM

## 2019-03-11 DIAGNOSIS — Z1159 Encounter for screening for other viral diseases: Secondary | ICD-10-CM | POA: Insufficient documentation

## 2019-03-11 DIAGNOSIS — Z20828 Contact with and (suspected) exposure to other viral communicable diseases: Secondary | ICD-10-CM

## 2019-03-11 NOTE — ED Triage Notes (Signed)
Patient presenting to Urgent Care for testing only. Outside provider ordered and referred patient for COVID-19 test.   Reason for test: Preprocedural Testing  NP swab obtained and sent for analysis.      Triage Note   Timonthy Hovater A Dacoda Finlay, RN

## 2019-03-12 LAB — COVID-19 NAAT (PCR): COVID-19 NAAT (PCR): 0

## 2019-03-29 ENCOUNTER — Telehealth: Payer: Self-pay | Admitting: Oncology

## 2019-03-29 NOTE — Telephone Encounter (Signed)
Returned call-offered zoom or telehome appt. He said his yearly appt was optional & didn't feel it was necessary.  Appt cx'd.

## 2019-04-01 ENCOUNTER — Ambulatory Visit: Payer: Medicare (Managed Care) | Admitting: Oncology

## 2019-04-26 ENCOUNTER — Ambulatory Visit: Payer: Medicare (Managed Care) | Admitting: Primary Care

## 2019-04-26 ENCOUNTER — Encounter: Payer: Self-pay | Admitting: Primary Care

## 2019-04-26 ENCOUNTER — Other Ambulatory Visit
Admission: RE | Admit: 2019-04-26 | Discharge: 2019-04-26 | Disposition: A | Discharge status: Home / Self Care | Payer: Medicare (Managed Care) | Source: Ambulatory Visit | Attending: Oncology | Admitting: Oncology

## 2019-04-26 VITALS — BP 120/70 | HR 56 | Temp 99.2°F | Ht 74.92 in | Wt 251.0 lb

## 2019-04-26 DIAGNOSIS — Z0000 Encounter for general adult medical examination without abnormal findings: Secondary | ICD-10-CM | POA: Insufficient documentation

## 2019-04-26 DIAGNOSIS — C61 Malignant neoplasm of prostate: Secondary | ICD-10-CM | POA: Insufficient documentation

## 2019-04-26 LAB — CBC AND DIFFERENTIAL
Baso # K/uL: 0.1 10*3/uL (ref 0.0–0.1)
Basophil %: 0.5 %
Eos # K/uL: 0.1 10*3/uL (ref 0.0–0.5)
Eosinophil %: 0.8 %
Hematocrit: 47 % (ref 40–51)
Hemoglobin: 15.6 g/dL (ref 13.7–17.5)
IMM Granulocytes #: 0.1 10*3/uL — ABNORMAL HIGH (ref 0.0–0.0)
IMM Granulocytes: 1 %
Lymph # K/uL: 1.8 10*3/uL (ref 1.3–3.6)
Lymphocyte %: 15.4 %
MCH: 33 pg/cell — ABNORMAL HIGH (ref 26–32)
MCHC: 33 g/dL (ref 32–37)
MCV: 99 fL — ABNORMAL HIGH (ref 79–92)
Mono # K/uL: 1.3 10*3/uL — ABNORMAL HIGH (ref 0.3–0.8)
Monocyte %: 11.7 %
Neut # K/uL: 8.1 10*3/uL — ABNORMAL HIGH (ref 1.8–5.4)
Nucl RBC # K/uL: 0 10*3/uL (ref 0.0–0.0)
Nucl RBC %: 0 /100 WBC (ref 0.0–0.2)
Platelets: 259 10*3/uL (ref 150–330)
RBC: 4.8 MIL/uL (ref 4.6–6.1)
RDW: 13.6 % (ref 11.6–14.4)
Seg Neut %: 70.6 %
WBC: 11.4 10*3/uL — ABNORMAL HIGH (ref 4.2–9.1)

## 2019-04-26 LAB — LIPID PANEL
Chol/HDL Ratio: 2.5
Cholesterol: 156 mg/dL
HDL: 62 mg/dL — ABNORMAL HIGH (ref 40–60)
LDL Calculated: 71 mg/dL
Non HDL Cholesterol: 94 mg/dL
Triglycerides: 116 mg/dL

## 2019-04-26 LAB — COMPREHENSIVE METABOLIC PANEL
ALT: 24 U/L (ref 0–50)
AST: 18 U/L (ref 0–50)
Albumin: 4.4 g/dL (ref 3.5–5.2)
Alk Phos: 84 U/L (ref 40–130)
Anion Gap: 11 (ref 7–16)
Bilirubin,Total: 0.9 mg/dL (ref 0.0–1.2)
CO2: 25 mmol/L (ref 20–28)
Calcium: 9.4 mg/dL (ref 8.6–10.2)
Chloride: 102 mmol/L (ref 96–108)
Creatinine: 1.01 mg/dL (ref 0.67–1.17)
GFR,Black: 86 *
GFR,Caucasian: 74 *
Glucose: 97 mg/dL (ref 60–99)
Lab: 32 mg/dL — ABNORMAL HIGH (ref 6–20)
Potassium: 5 mmol/L (ref 3.3–5.1)
Sodium: 138 mmol/L (ref 133–145)
Total Protein: 6.9 g/dL (ref 6.3–7.7)

## 2019-04-26 LAB — TESTOSTERONE: Testosterone: 27 ng/dL — ABNORMAL LOW (ref 193–740)

## 2019-04-26 LAB — NEUTROPHIL #-INSTRUMENT: Neutrophil #-Instrument: 8.1 10*3/uL

## 2019-04-26 LAB — MULTIPLE ORDERING DOCS

## 2019-04-26 LAB — PSA (EFF.4-2010): PSA (eff. 4-2010): <0.02 ng/mL (ref 0.00–4.00)

## 2019-04-26 NOTE — Patient Instructions (Signed)
Thank you for completing your Initial Annual Medicare visit   with [place] today.     The purpose of this visits was to:     Screen for disease   Assess risk of future medical problems   Help develop a healthy lifestyle   Update vaccines   Get to know your doctor in case of an illness    Patient Care Team:  Blythe Stanford, MD as PCP - General  Dalia Heading, MD as Surgeon  Humberto Seals, MD (Hematology and Oncology)  Tawanna Solo, NP (Oncology)     Medicare 5 Year Plan    The following items were identified as areas of concern during your screening today:  BMI greater than 25 - This is a risk for Heart Attack, Stroke, High Blood Pressure, Diabetes, High Cholesterol and other complications.       The Health Maintenance table below identifies screening tests and immunizations recommended by your health care team:  Health Maintenance   Topic Date Due    HIV Screening  12/22/1960    Hepatitis C Screening  12/22/1965    Shingles Vaccine (1 of 2) 12/22/1997    Adult Pneumovax Vaccine (1) 04/30/2016    DEPRESSION SCREEN YEARLY  11/21/2017    Fall Risk Screening  11/26/2018    Flu Shot (1) 04/13/2019    Colon Cancer Screening  06/27/2022    Adult Prevnar Vaccination  Completed    Abdominal Aortic Aneurysm Screening  Completed     In addition, goals and orders placed to address these recommendations are listed in the "Today's Visit" section.    We wish you the best of health and look forward to seeing you again next year for your Annual Medicare Wellness Visit.     If you have any health care concerns before then, please do not hesitate to contact us.

## 2019-04-26 NOTE — Progress Notes (Signed)
Due to pandemic event, telehome visit preformed via:           Office Visit, met with patient in person    Today we reviewed and updated Maxwell Wells smoking status, activities of daily living, depression screen, fall risk, medications and allergies.   I have counseled the patient in the above areas.     Subjective:     Chief Complaint: Maxwell Wells is a 73 y.o. male here for a/an Initial Annual Medicare visit    In general, Maxwell Wells rates their overall health as:  good      Patient Care Team:  Maxwell Stanford, MD as PCP - General  Maxwell Heading, MD as Surgeon  Maxwell Seals, MD (Hematology and Oncology)  Maxwell Solo, NP (Oncology)     Current Outpatient Medications on File Prior to Visit   Medication Sig Dispense Refill    cefuroxime (CEFTIN) 250 MG tablet Take 250 mg by mouth 2 times daily      loperamide (IMODIUM) 2 MG capsule Take 2 mg by mouth daily      atorvastatin (LIPITOR) 20 MG tablet       metoprolol (TOPROL-XL) 100 MG 24 hr tablet Take 50 mg by mouth daily         TIKOSYN 500 MCG capsule 500 mcg 2 times daily         Multiple Vitamin (MULTIVITAMIN) per tablet Take 1 tablet by mouth daily       No current facility-administered medications on file prior to visit.      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     Created by Conversion         Past Medical History:   Diagnosis Date    AF (atrial fibrillation)     Prostate cancer 10/18/2010    Renal cell cancer 10/18/2010     Past Surgical History:   Procedure Laterality Date     PARTIAL NEPHRECTOMY      PROSTATE SURGERY       Family History   Problem Relation Age of Onset    High Blood Pressure Mother     Heart Disease Father     High cholesterol Father     High Blood Pressure Father      Social History     Socioeconomic History    Marital status: Married     Spouse name: Not on file    Number of children: Not on file    Years of education: Not on file    Highest education level: Not on file   Occupational History    Not on file   Tobacco Use    Smoking status: Former Smoker     Packs/day: 0.50     Years: 15.00     Pack years: 7.50    Smokeless tobacco: Never Used   Substance  and Sexual Activity    Alcohol use: Yes     Comment: social    Drug use: Never    Sexual activity: Not on file   Social History Narrative    Not on file       Objective:     Vital Signs: BP 120/70    Pulse 56    Temp 37.3 C (99.2 F) (Temporal)    Ht 1.903 m (6' 2.92")    Wt 113.9 kg (251 lb)    SpO2 98%    BMI 31.44 kg/m    BMI: Body mass index is 31.44 kg/m.    Vision Screening Results (Welcome visit only):  No exam data present    Depression Screening Results:  Recent Review Flowsheet Data     PHQ Scores 04/26/2019 11/21/2016    PSQ2 Q1 - Interest/Pleasure N N    PSQ2 Q2 - Down, Depressed, Hopeless N N        Opioid Use/DAST- 10 Screening Results:   No data recorded  Activities of Daily Living/Functional Screening Results:  Is the person deaf or does he/she have serious difficulty hearing?: N  Is this person blind or does he/she have serious difficulty seeing even when wearing glasses?: N  *Vision Status: No impairment  Does this person have serious difficulty walking or climbing stairs?: N  Does this person have difficulty dressing or bathing?: N  *Shopping: Independent  *House Keeping: Independent  *Managing Own Medications: Independent  *Handling Finances: Independent  Difficulty doing errands due to a physicial, mental or emotional condition: No  Difficulty remembering or making decisions  due to a physicial, mental or emotional condition: No      Fall Risk Screening Results:  Have you fallen in the last year?: No  Do you feel you are at risk for falling?: Yes  Do you feel your risk of falling is: : Low  Would you like to learn more about how to reduce your risk of falling?: No      Assessment and Plan:     Cognitive Function:  Recall of recent and remote events appears:  Normal      Advanced Care Planning:  was discussed and patient received paperwork to review     The following health maintenance plan was reviewed with the patient:    Health Maintenance Topics with due status: Overdue       Topic Date Due    HIV Screening USPSTF/[place] 12/22/1960    Hepatitis C Screening 12/22/1965    IMM-ZOSTER 12/22/1997    IMM-PNEUMOVAX VACCINE 65 + YRS 04/30/2016    DEPRESSION SCREEN YEARLY 11/21/2017    Fall Risk Screening 11/26/2018    IMM-INFLUENZA 04/13/2019     Health Maintenance Topics with due status: Not Due       Topic Last Completion Date    Colon Cancer Screening Other 06/27/2017     Health Maintenance Topics with due status: Completed       Topic Last Completion Date    AAA Screening USPSTF 08/01/2014    IMM-PREVNAR VACCINE 65 + YRS 05/01/2015     This health maintenance schedule, identified risks, a list of orders placed today and patient goals have been provided to Maxwell Wells in the after visit summary.     Plan for any concerns identified during screening or risk assessments:  n/a

## 2019-04-27 LAB — HEMOGLOBIN A1C: Hemoglobin A1C: 5.5 %

## 2019-04-28 ENCOUNTER — Encounter: Payer: Self-pay | Admitting: Primary Care

## 2019-04-28 ENCOUNTER — Encounter: Payer: Self-pay | Admitting: Oncology

## 2019-04-28 DIAGNOSIS — R7989 Other specified abnormal findings of blood chemistry: Secondary | ICD-10-CM

## 2019-04-29 ENCOUNTER — Encounter: Payer: Self-pay | Admitting: Primary Care

## 2019-04-29 NOTE — NoShare Progress Note (Signed)
Maxwell Wells is a pleasant 73 year old gentleman who presents to our office today for a routine physical exam.  He also had a Medicare wellness visit done today.  He states he is doing okay.  He has a complicated medical history significant for prostate cancer and renal cell carcinoma.  He had a long session with gross hematuria secondary to radiation cystitis but things have finally ceased after having hyperbaric oxygen.  He states that otherwise he is doing okay.  No chest pain.  No increased shortness of breath.  No headaches.  No dizziness or lightheadedness.    Family history-significant for hypertension and hyperlipidemia.    Social history-patient is married lives with his wife.  He is retired.  He does not smoke.  Social alcohol consumption.  Enjoys going down to [place] for the winter.  Currently playing golf.  No formal exercise.    Medications reviewed and updated in EMR  List reflects end of visit medication list.  Problem list updated.  Current Outpatient Medications   Medication Sig Dispense Refill    cefuroxime (CEFTIN) 250 MG tablet Take 250 mg by mouth 2 times daily      loperamide (IMODIUM) 2 MG capsule Take 2 mg by mouth daily      atorvastatin (LIPITOR) 20 MG tablet       metoprolol (TOPROL-XL) 100 MG 24 hr tablet Take 50 mg by mouth daily         TIKOSYN 500 MCG capsule 500 mcg 2 times daily         Multiple Vitamin (MULTIVITAMIN) per tablet Take 1 tablet by mouth daily       No current facility-administered medications for this visit.      Allergies---  Food  Patient Active Problem List   Diagnosis Code    Prostate cancer C61    Renal cell cancer C64.9    Male Erectile Disorder F52.8    Arthritis M12.9    Nephrolithiasis N20.0    Post Prostatectomy Z98.89    Other and unspecified hyperlipidemia E78.5    Atrial fibrillation I48.91     Body mass index is 31.44 kg/m.  Blood pressure 120/70, pulse 56, temperature 37.3 C (99.2 F), temperature source Temporal, height 1.903 m (6' 2.92"),  weight 113.9 kg (251 lb), SpO2 98 %.      Pleasant gentleman is moderately obese.  Skin warm and dry.  Head and neck exam normal.  No scleral icterus.  No lymphadenopathy.  No carotid bruits.  Lungs clear.  Cardiac exam reveals a regular rhythm.  Soft systolic murmur.  No S3 gallop.  Abdomen is soft and nontender.  No masses.  No organomegaly.  Extremities are without edema.  Pulses 2+ and symmetric.    EKG deferred to cardiology.    Laboratory data pending.    Pleasant gentleman comes to our office today for routine follow-up.  He is doing reasonably well despite his complex medical history.  Fortunately, his gross hematuria has resolved.  Continue cardiology follow-up.  Continue urology follow-up.  No change in medication made today.  I will call him with his lab test results.  Follow-up 6 months or sooner if needed

## 2019-05-10 ENCOUNTER — Other Ambulatory Visit
Admission: RE | Admit: 2019-05-10 | Discharge: 2019-05-10 | Disposition: A | Discharge status: Home / Self Care | Payer: Medicare (Managed Care) | Source: Ambulatory Visit | Attending: Oncology | Admitting: Oncology

## 2019-05-10 DIAGNOSIS — C61 Malignant neoplasm of prostate: Secondary | ICD-10-CM

## 2019-05-10 DIAGNOSIS — R7989 Other specified abnormal findings of blood chemistry: Secondary | ICD-10-CM | POA: Insufficient documentation

## 2019-05-10 LAB — TESTOSTERONE: Testosterone: 107 ng/dL — ABNORMAL LOW (ref 193–740)

## 2019-05-10 LAB — PROLACTIN: Prolactin: 7.2 ng/mL (ref 4.0–15.2)

## 2019-05-11 ENCOUNTER — Encounter: Payer: Self-pay | Admitting: Oncology

## 2019-05-28 ENCOUNTER — Ambulatory Visit
Admission: AD | Admit: 2019-05-28 | Discharge: 2019-05-28 | Disposition: A | Discharge status: Home / Self Care | Payer: Medicare (Managed Care) | Source: Ambulatory Visit | Attending: General Orthopedics | Admitting: General Orthopedics

## 2019-05-28 DIAGNOSIS — Z20828 Contact with and (suspected) exposure to other viral communicable diseases: Secondary | ICD-10-CM | POA: Insufficient documentation

## 2019-05-28 NOTE — ED Notes (Signed)
Patient presenting to Urgent Care for testing only. COVID-19 test ordered by Urgent Care Provider     Patient occupation: None of the above    Does this patient currently have symptoms concerning for COVID-19?: No     What is the reason for testing?: needs negative covid swab to [place] family member in [place]       Nasal swab obtained and sent for analysis.

## 2019-05-29 LAB — COVID-19 NAAT (PCR): COVID-19 NAAT (PCR): NEGATIVE

## 2019-05-29 LAB — COVID-19 PCR

## 2019-06-14 ENCOUNTER — Other Ambulatory Visit: Payer: Self-pay | Admitting: Gastroenterology

## 2019-06-14 ENCOUNTER — Other Ambulatory Visit: Payer: Self-pay

## 2019-06-15 LAB — COVID-19 NAAT (PCR): COVID-19 NAAT (PCR): NOT DETECTED

## 2019-06-17 ENCOUNTER — Other Ambulatory Visit: Payer: Self-pay | Admitting: Gastroenterology

## 2019-06-17 LAB — UNMAPPED LAB RESULTS
Basophil # (HT): 0.1 10 3/uL — NL (ref 0.0–0.2)
Basophil % (HT): 1 % — NL (ref 0–3)
Eosinophil # (HT): 0.1 10 3/uL — NL (ref 0.0–0.6)
Eosinophil % (HT): 2 % — NL (ref 0–5)
Hematocrit (HT): 40 % — NL (ref 40–52)
Hemoglobin (HGB) (HT): 13.7 g/dL — NL (ref 13.0–18.0)
Lymphocyte # (HT): 1.5 10 3/uL — NL (ref 1.0–4.8)
Lymphocyte % (HT): 21 % — NL (ref 15–45)
MCHC (HT): 34 g/dL — NL (ref 32.0–37.5)
MCV (HT): 97 fL — NL (ref 80–100)
Mean Corpuscular Hemoglobin (MCH) (HT): 32.9 pg — NL (ref 26.0–34.0)
Monocyte # (HT): 0.9 10 3/uL — NL (ref 0.1–1.0)
Monocyte % (HT): 12 % — NL (ref 0–15)
Neutrophil # (HT): 4.5 10 3/uL — NL (ref 1.8–8.0)
Platelets (HT): 267 10 3/uL — NL (ref 150–450)
RBC (HT): 4.17 10 6/uL — ABNORMAL LOW (ref 4.40–6.20)
RDW (HT): 13.2 % — NL (ref 0.0–15.2)
Seg Neut % (HT): 64 % — NL (ref 45–75)
WBC (HT): 7.1 10 3/uL — NL (ref 4.0–11.0)

## 2019-06-22 ENCOUNTER — Other Ambulatory Visit: Payer: Self-pay

## 2019-06-24 ENCOUNTER — Other Ambulatory Visit: Payer: Self-pay

## 2019-06-24 LAB — AEROBIC CULTURE

## 2019-06-25 LAB — COVID-19 NAAT (PCR): COVID-19 NAAT (PCR): NOT DETECTED

## 2019-06-28 ENCOUNTER — Encounter: Payer: Self-pay | Admitting: Gastroenterology

## 2019-06-30 ENCOUNTER — Other Ambulatory Visit: Payer: Self-pay | Admitting: Gastroenterology

## 2019-06-30 LAB — UNMAPPED LAB RESULTS
Basophil # (HT): 0.1 10 3/uL — NL (ref 0.0–0.2)
Basophil % (HT): 1 % — NL (ref 0–3)
Eosinophil # (HT): 0.1 10 3/uL — NL (ref 0.0–0.6)
Eosinophil % (HT): 1 % — NL (ref 0–5)
Hematocrit (HT): 42 % — NL (ref 40–52)
Hemoglobin (HGB) (HT): 14.1 g/dL — NL (ref 13.0–18.0)
Lymphocyte # (HT): 1.5 10 3/uL — NL (ref 1.0–4.8)
Lymphocyte % (HT): 8 % — ABNORMAL LOW (ref 15–45)
MCHC (HT): 33.5 g/dL — NL (ref 32.0–37.5)
MCV (HT): 98 fL — NL (ref 80–100)
Mean Corpuscular Hemoglobin (MCH) (HT): 32.6 pg — NL (ref 26.0–34.0)
Monocyte # (HT): 1.9 10 3/uL — ABNORMAL HIGH (ref 0.1–1.0)
Monocyte % (HT): 10 % — NL (ref 0–15)
Neutrophil # (HT): 15.2 10 3/uL — ABNORMAL HIGH (ref 1.8–8.0)
Platelets (HT): 320 10 3/uL — NL (ref 150–450)
RBC (HT): 4.32 10 6/uL — ABNORMAL LOW (ref 4.40–6.20)
RDW (HT): 12.7 % — NL (ref 0.0–15.2)
Seg Neut % (HT): 80 % — ABNORMAL HIGH (ref 45–75)
WBC (HT): 19 10 3/uL — ABNORMAL HIGH (ref 4.0–11.0)

## 2019-07-01 LAB — UNMAPPED LAB RESULTS
Basophil # (HT): 0.1 10 3/uL — NL (ref 0.0–0.2)
Basophil % (HT): 0 % — NL (ref 0–3)
Eosinophil # (HT): 0.2 10 3/uL — NL (ref 0.0–0.6)
Eosinophil % (HT): 1 % — NL (ref 0–5)
Hematocrit (HT): 38 % — ABNORMAL LOW (ref 40–52)
Hemoglobin (HGB) (HT): 13 g/dL — NL (ref 13.0–18.0)
Lymphocyte # (HT): 1.4 10 3/uL — NL (ref 1.0–4.8)
Lymphocyte % (HT): 8 % — ABNORMAL LOW (ref 15–45)
MCHC (HT): 33.9 g/dL — NL (ref 32.0–37.5)
MCV (HT): 98 fL — NL (ref 80–100)
Mean Corpuscular Hemoglobin (MCH) (HT): 33 pg — NL (ref 26.0–34.0)
Monocyte # (HT): 2 10 3/uL — ABNORMAL HIGH (ref 0.1–1.0)
Monocyte % (HT): 11 % — NL (ref 0–15)
Neutrophil # (HT): 14.1 10 3/uL — ABNORMAL HIGH (ref 1.8–8.0)
Platelets (HT): 287 10 3/uL — NL (ref 150–450)
RBC (HT): 3.94 10 6/uL — ABNORMAL LOW (ref 4.40–6.20)
RDW (HT): 12.9 % — NL (ref 0.0–15.2)
Seg Neut % (HT): 79 % — ABNORMAL HIGH (ref 45–75)
WBC (HT): 17.9 10 3/uL — ABNORMAL HIGH (ref 4.0–11.0)

## 2019-07-02 LAB — UNMAPPED LAB RESULTS
Basophil # (HT): 0 10 3/uL — NL (ref 0.0–0.2)
Basophil % (HT): 0 % — NL (ref 0–3)
Eosinophil # (HT): 0 10 3/uL — NL (ref 0.0–0.6)
Eosinophil % (HT): 0 % — NL (ref 0–5)
Hematocrit (HT): 39 % — ABNORMAL LOW (ref 40–52)
Hemoglobin (HGB) (HT): 13.2 g/dL — NL (ref 13.0–18.0)
Lymphocyte # (HT): 0.7 10 3/uL — ABNORMAL LOW (ref 1.0–4.8)
Lymphocyte % (HT): 5 % — ABNORMAL LOW (ref 15–45)
MCHC (HT): 34 g/dL — NL (ref 32.0–37.5)
MCV (HT): 97 fL — NL (ref 80–100)
Mean Corpuscular Hemoglobin (MCH) (HT): 32.8 pg — NL (ref 26.0–34.0)
Monocyte # (HT): 0.7 10 3/uL — NL (ref 0.1–1.0)
Monocyte % (HT): 5 % — NL (ref 0–15)
Neutrophil # (HT): 12.7 10 3/uL — ABNORMAL HIGH (ref 1.8–8.0)
Platelets (HT): 316 10 3/uL — NL (ref 150–450)
RBC (HT): 4.02 10 6/uL — ABNORMAL LOW (ref 4.40–6.20)
RDW (HT): 12.3 % — NL (ref 0.0–15.2)
Seg Neut % (HT): 89 % — ABNORMAL HIGH (ref 45–75)
WBC (HT): 14.2 10 3/uL — ABNORMAL HIGH (ref 4.0–11.0)

## 2019-07-03 LAB — UNMAPPED LAB RESULTS
Basophil # (HT): 0 10 3/uL — NL (ref 0.0–0.2)
Basophil % (HT): 0 % — NL (ref 0–3)
Eosinophil # (HT): 0 10 3/uL — NL (ref 0.0–0.6)
Eosinophil % (HT): 0 % — NL (ref 0–5)
Hematocrit (HT): 35 % — ABNORMAL LOW (ref 40–52)
Hemoglobin (HGB) (HT): 11.8 g/dL — ABNORMAL LOW (ref 13.0–18.0)
Lymphocyte # (HT): 1.2 10 3/uL — NL (ref 1.0–4.8)
Lymphocyte % (HT): 9 % — ABNORMAL LOW (ref 15–45)
MCHC (HT): 34 g/dL — NL (ref 32.0–37.5)
MCV (HT): 98 fL — NL (ref 80–100)
Mean Corpuscular Hemoglobin (MCH) (HT): 33.3 pg — NL (ref 26.0–34.0)
Monocyte # (HT): 1.1 10 3/uL — ABNORMAL HIGH (ref 0.1–1.0)
Monocyte % (HT): 8 % — NL (ref 0–15)
Neutrophil # (HT): 10.6 10 3/uL — ABNORMAL HIGH (ref 1.8–8.0)
Platelets (HT): 350 10 3/uL — NL (ref 150–450)
RBC (HT): 3.54 10 6/uL — ABNORMAL LOW (ref 4.40–6.20)
RDW (HT): 12.3 % — NL (ref 0.0–15.2)
Seg Neut % (HT): 81 % — ABNORMAL HIGH (ref 45–75)
WBC (HT): 13 10 3/uL — ABNORMAL HIGH (ref 4.0–11.0)

## 2019-07-04 LAB — UNMAPPED LAB RESULTS
Basophil # (HT): 0.1 10 3/uL — NL (ref 0.0–0.2)
Basophil % (HT): 1 % — NL (ref 0–3)
Eosinophil # (HT): 0.4 10 3/uL — NL (ref 0.0–0.6)
Eosinophil % (HT): 4 % — NL (ref 0–5)
Hematocrit (HT): 38 % — ABNORMAL LOW (ref 40–52)
Hemoglobin (HGB) (HT): 12.9 g/dL — ABNORMAL LOW (ref 13.0–18.0)
Lymphocyte # (HT): 1.9 10 3/uL — NL (ref 1.0–4.8)
Lymphocyte % (HT): 22 % — NL (ref 15–45)
MCHC (HT): 33.9 g/dL — NL (ref 32.0–37.5)
MCV (HT): 97 fL — NL (ref 80–100)
Mean Corpuscular Hemoglobin (MCH) (HT): 33 pg — NL (ref 26.0–34.0)
Monocyte # (HT): 1.1 10 3/uL — ABNORMAL HIGH (ref 0.1–1.0)
Monocyte % (HT): 13 % — NL (ref 0–15)
Neutrophil # (HT): 5.1 10 3/uL — NL (ref 1.8–8.0)
Platelets (HT): 346 10 3/uL — NL (ref 150–450)
RBC (HT): 3.91 10 6/uL — ABNORMAL LOW (ref 4.40–6.20)
RDW (HT): 12.4 % — NL (ref 0.0–15.2)
Seg Neut % (HT): 60 % — NL (ref 45–75)
WBC (HT): 8.5 10 3/uL — NL (ref 4.0–11.0)

## 2019-07-05 LAB — UNMAPPED LAB RESULTS
Basophil # (HT): 0.1 10 3/uL — NL (ref 0.0–0.2)
Basophil % (HT): 1 % — NL (ref 0–3)
Eosinophil # (HT): 0.5 10 3/uL — NL (ref 0.0–0.6)
Eosinophil % (HT): 5 % — NL (ref 0–5)
Hematocrit (HT): 41 % — NL (ref 40–52)
Hemoglobin (HGB) (HT): 13.7 g/dL — NL (ref 13.0–18.0)
Lymphocyte # (HT): 1.9 10 3/uL — NL (ref 1.0–4.8)
Lymphocyte % (HT): 19 % — NL (ref 15–45)
MCHC (HT): 33.4 g/dL — NL (ref 32.0–37.5)
MCV (HT): 97 fL — NL (ref 80–100)
Mean Corpuscular Hemoglobin (MCH) (HT): 32.5 pg — NL (ref 26.0–34.0)
Monocyte # (HT): 1.3 10 3/uL — ABNORMAL HIGH (ref 0.1–1.0)
Monocyte % (HT): 13 % — NL (ref 0–15)
Neutrophil # (HT): 6 10 3/uL — NL (ref 1.8–8.0)
Platelets (HT): 343 10 3/uL — NL (ref 150–450)
RBC (HT): 4.21 10 6/uL — ABNORMAL LOW (ref 4.40–6.20)
RDW (HT): 12.3 % — NL (ref 0.0–15.2)
Seg Neut % (HT): 61 % — NL (ref 45–75)
WBC (HT): 9.8 10 3/uL — NL (ref 4.0–11.0)

## 2019-07-07 ENCOUNTER — Telehealth: Payer: Self-pay | Admitting: Primary Care

## 2019-07-07 NOTE — Telephone Encounter (Signed)
Pt was at [facility] from 11/18 discharge 07/05/19 was discharged by infectious Disease, has a port and put on ABX for 3 different infections:  Pseudomonas Aeruginosa, Moranella Morganii and Proteus Mirabilis, the ABX is Cefepime 2 GM 3 times a day. He also has 3 different wounds. Romie Minus is concerned that [facility] came last night quickly looked at his wounds and just recommended he keep them clean and dry.   Romie Minus would like to discuss further with you about how to proceed with homecare.

## 2019-07-12 ENCOUNTER — Telehealth: Payer: Self-pay | Admitting: Primary Care

## 2019-07-12 ENCOUNTER — Other Ambulatory Visit: Payer: Self-pay

## 2019-07-12 ENCOUNTER — Encounter: Payer: Self-pay | Admitting: Gastroenterology

## 2019-07-12 LAB — UNMAPPED LAB RESULTS
Basophil # (HT): 0.1 10 3/uL — NL (ref 0.0–0.2)
Basophil % (HT): 0.7 % — NL (ref 0.0–1.8)
Eosinophil # (HT): 0.3 10 3/uL — NL (ref 0.0–0.5)
Eosinophil % (HT): 3.5 % — NL (ref 0.0–7.0)
Hematocrit (HT): 38.3 % — ABNORMAL LOW (ref 40.0–52.0)
Hemoglobin (HGB) (HT): 12.9 g/dL — ABNORMAL LOW (ref 13.0–17.5)
Lymphocyte # (HT): 1.5 10 3/uL — NL (ref 0.9–3.8)
Lymphocyte % (HT): 20.2 % — NL (ref 17.0–44.0)
MCHC (HT): 33.7 g/dL — NL (ref 32.0–36.0)
MCV (HT): 97.5 FL — NL (ref 81.0–99.0)
Mean Corpuscular Hemoglobin (MCH) (HT): 32.8 pg — NL (ref 26.0–34.0)
Monocyte # (HT): 0.8 10 3/uL — NL (ref 0.2–1.0)
Monocyte % (HT): 11 % — NL (ref 4.0–12.0)
Neutrophil # (HT): 4.8 10 3/uL — NL (ref 1.5–7.7)
Platelets (HT): 361 10 3/uL — NL (ref 140–400)
RBC (HT): 3.93 10 6/uL — ABNORMAL LOW (ref 4.20–5.90)
RDW (HT): 11.7 % — NL (ref 11.5–15.0)
Seg Neut % (HT): 64.6 % — NL (ref 40.0–75.0)
WBC (HT): 7.5 10 3/uL — NL (ref 4.0–10.8)

## 2019-07-12 LAB — CBC AND DIFFERENTIAL
Baso # K/uL: 0.1 10*3/uL (ref 0.0–0.2)
Basophil %: 0.7 % (ref 0.0–1.8)
Eos # K/uL: 0.3 10*3/uL (ref 0.0–0.5)
Eosinophil %: 3.5 % (ref 0.0–7.0)
Hematocrit: 38.3 % — ABNORMAL LOW (ref 40.0–52.0)
Hemoglobin: 12.9 g/dL — ABNORMAL LOW (ref 13.0–17.5)
Lymph # K/uL: 1.5 10*3/uL (ref 0.9–3.8)
Lymphocyte %: 20.2 % (ref 17.0–44.0)
MCH: 32.8 PG (ref 26.0–34.0)
MCHC: 33.7 g/dL (ref 32.0–36.0)
MCV: 97.5 FL (ref 81.0–99.0)
Mono # K/uL: 0.8 10*3/uL (ref 0.2–1.0)
Monocyte %: 11 % (ref 4.0–12.0)
Neut # K/uL: 4.8 10*3/uL (ref 1.5–7.7)
Platelets: 361 10*3/uL (ref 140–400)
RBC: 3.93 10*6/uL — ABNORMAL LOW (ref 4.20–5.90)
RDW: 11.7 % (ref 11.5–15.0)
Seg Neut %: 64.6 % (ref 40.0–75.0)
WBC: 7.5 10*3/uL (ref 4.0–10.8)

## 2019-07-12 NOTE — Telephone Encounter (Signed)
Needs to discuss with infectious disease--they are managing his antibiotics

## 2019-07-12 NOTE — Telephone Encounter (Signed)
Spoke to Maxwell Wells, notified that he should talk to ID, they manage his antibiotics

## 2019-07-12 NOTE — Telephone Encounter (Signed)
Maxwell Wells from [facility], called wanting to know if the patient is to continue taking Cefepime?  Pls notify Maxwell Wells @ [phone].

## 2019-07-13 LAB — URINALYSIS WITH REFLEX TO MICROSCOPIC
Bacteria,UA: NONE SEEN /hpf
Bilirubin,Ur: NEGATIVE
Glucose,UA: NEGATIVE g/dl
Hyaline Casts,UA: 4 /lpf — AB (ref <2)
Nitrite,UA: NEGATIVE
Specific Gravity,UA: 1.02 (ref 1.010–1.030)
Squam Epithel,UA: 3 /hpf
Urobilinogen,UA: NEGATIVE mg/dL
WBC,UA: 35 /hpf — AB (ref <6)
pH,UA: 6 (ref 5.5–7.0)

## 2019-07-14 LAB — AEROBIC CULTURE

## 2019-07-19 ENCOUNTER — Encounter: Payer: Self-pay | Admitting: Gastroenterology

## 2019-07-21 ENCOUNTER — Encounter: Payer: Self-pay | Admitting: Gastroenterology

## 2019-07-21 ENCOUNTER — Telehealth: Payer: Self-pay | Admitting: Primary Care

## 2019-07-21 ENCOUNTER — Other Ambulatory Visit: Payer: Self-pay

## 2019-07-21 LAB — UNMAPPED LAB RESULTS
Basophil # (HT): 0.1 10 3/uL — NL (ref 0.0–0.2)
Basophil % (HT): 1 % — NL (ref 0–3)
Eosinophil # (HT): 0.1 10 3/uL — NL (ref 0.0–0.6)
Eosinophil % (HT): 2 % — NL (ref 0–5)
Hematocrit (HT): 41 % — NL (ref 40–52)
Hemoglobin (HGB) (HT): 13.8 g/dL — NL (ref 13.0–18.0)
Lymphocyte # (HT): 2.1 10 3/uL — NL (ref 1.0–4.8)
Lymphocyte % (HT): 24 % — NL (ref 15–45)
MCHC (HT): 34.1 g/dL — NL (ref 32.0–37.5)
MCV (HT): 96 fL — NL (ref 80–100)
Mean Corpuscular Hemoglobin (MCH) (HT): 32.9 pg — NL (ref 26.0–34.0)
Monocyte # (HT): 1 10 3/uL — NL (ref 0.1–1.0)
Monocyte % (HT): 12 % — NL (ref 0–15)
Neutrophil # (HT): 5.4 10 3/uL — NL (ref 1.8–8.0)
Platelets (HT): 331 10 3/uL — NL (ref 150–450)
RBC (HT): 4.2 10 6/uL — ABNORMAL LOW (ref 4.40–6.20)
RDW (HT): 12.4 % — NL (ref 0.0–15.2)
Seg Neut % (HT): 62 % — NL (ref 45–75)
WBC (HT): 8.8 10 3/uL — NL (ref 4.0–11.0)

## 2019-07-21 NOTE — Telephone Encounter (Signed)
For Demetrius Charity- Romie Minus is calling for [facility] and he's had 3 surgeries and has a catheter. When Bennet moves he has blood in the catheter bag. [place] has an appointment consult for hyperbaric chamber today. [name] saw urology yesterday and that doctor wasn't "happy" about blood in catheter. Can you call Romie Minus tomorrow to advise?

## 2019-07-22 LAB — BASIC METABOLIC PANEL
Anion Gap: 8 meq/L (ref 4–16)
CO2: 26 meq/L (ref 22–30)
Calcium: 9.2 mg/dL (ref 8.5–10.2)
Chloride: 106 meq/L (ref 98–108)
Creatinine: 0.9 mg/dL (ref 0.7–1.2)
Glucose: 112 mg/dL — ABNORMAL HIGH (ref 65–100)
Lab: 20 mg/dL (ref 8–20)
Potassium: 4.6 meq/L (ref 3.5–5.1)
Sodium: 140 meq/L (ref 135–145)

## 2019-07-22 LAB — URINALYSIS REFLEX TO CULTURE
Glucose, Ur: NEGATIVE mg/dL
Ketones, UA: NEGATIVE mg/dL
Nitrite,UA: POSITIVE — AB
PH,Ur: 5.5 (ref 5.0–8.0)
Protein,UA: 300 mg/dL — AB
Specific Gravity,UA: 1.018 (ref 1.005–1.030)

## 2019-07-22 LAB — CBC AND DIFFERENTIAL
Baso # K/uL: 0.1 10*3/uL (ref 0.0–0.2)
Basophil %: 1 % (ref 0–3)
Eos # K/uL: 0.1 10*3/uL (ref 0.0–0.6)
Eosinophil %: 2 % (ref 0–5)
Hematocrit: 41 % (ref 40–52)
Hemoglobin: 13.8 g/dL (ref 13.0–18.0)
Lymph # K/uL: 2.1 10*3/uL (ref 1.0–4.8)
Lymphocyte %: 24 % (ref 15–45)
MCH: 32.9 pg (ref 26.0–34.0)
MCHC: 34.1 g/dL (ref 32.0–37.5)
MCV: 96 fL (ref 80–100)
Mono # K/uL: 1 10*3/uL (ref 0.1–1.0)
Monocyte %: 12 % (ref 0–15)
Neut # K/uL: 5.4 10*3/uL (ref 1.8–8.0)
Platelets: 331 10*3/uL (ref 150–450)
RBC: 4.2 10*6/uL — ABNORMAL LOW (ref 4.40–6.20)
RDW: 12.4 % (ref 0.0–15.2)
Seg Neut %: 62 % (ref 45–75)
WBC: 8.8 10*3/uL (ref 4.0–11.0)

## 2019-07-22 LAB — ESTIMATED GFR
GFR,Black: 60 mL/min
GFR,Caucasian: 60 mL/min

## 2019-07-22 NOTE — Telephone Encounter (Signed)
Called and LM

## 2019-07-23 ENCOUNTER — Telehealth: Payer: Self-pay | Admitting: Primary Care

## 2019-07-23 ENCOUNTER — Encounter: Payer: Self-pay | Admitting: Primary Care

## 2019-07-23 LAB — MIN INHIB CONCENTRATION
Ciprofloxacin: 0.25 ug/mL — AB
Gentamicin: 1 ug/mL — AB
Levofloxacin: 1 ug/mL — AB
Tobramycin Level: 1 ug/mL — AB

## 2019-07-23 LAB — AEROBIC CULTURE

## 2019-07-23 NOTE — Telephone Encounter (Signed)
Spoke to wife about each concern. (she is worried and had wanted to run this all by DD . She understands he is out of office, however I will forward my notes so he is aware).    Urology has told her he needs IV antibiotic. His PICC line was removed 1 1/2 weeks ago.  They are working on infusion vs. PICC line and will be calling her back today.    She is worried about potentially waiting for antibiotic to start Monday.  Encouraged her to see what urology plans, talk to urologist about her concern of waiting until Monday (if that is what is suggested she doe not know yet).

## 2019-07-23 NOTE — Telephone Encounter (Signed)
The patient's wife, Romie Minus, called stating she had heard from the urologist and would like to talk to you about that.  Pls call Romie Minus back @ [phone].

## 2019-07-23 NOTE — Telephone Encounter (Signed)
Deb please let them know Shanon Brow is out of the office today.  Can you clarify please.  I do not see any labs that Shanon Brow ordered.  There are some external labs but can you clarify who ordered them because they should probably review the results.  Let me know what she says.  Thank you.

## 2019-07-23 NOTE — Telephone Encounter (Signed)
Has he called urology yet?  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 I have advised him return to ED for any fevers, pain, or recurrent urinary symptoms and to otherwise follow-up with his urologist.    Let me know.

## 2019-07-23 NOTE — Telephone Encounter (Signed)
Ok,  They are labs from [facility] ED.  He can see them in [facility].  He sees he has an infection that no one has called him to let him know if meds need to be changed.  They are concerned as it is Friday. And the weekend is upon [place].  Can you check the labs (12/9) and call them.  They thank you.

## 2019-07-23 NOTE — Telephone Encounter (Signed)
Pt's wife called on behalf of pt, she requested a call back with recent lab results.  Please advise and call Romie Minus back.

## 2019-07-23 NOTE — Telephone Encounter (Signed)
They are waiting for 2nd call from Urologist.  They wanted to put him on Cipro, he can't take it due to tikosyn(?sp).  Urology spoke to his cardiologist and they are deciding on another med.  So they are waiting for Urology to return call.

## 2019-07-25 ENCOUNTER — Other Ambulatory Visit: Payer: Self-pay

## 2019-07-25 LAB — UNMAPPED LAB RESULTS
Basophil # (HT): 0 10 3/uL — NL (ref 0.0–0.2)
Basophil % (HT): 0 % — NL (ref 0–3)
Eosinophil # (HT): 0 10 3/uL — NL (ref 0.0–0.6)
Eosinophil % (HT): 0 % — NL (ref 0–5)
Hematocrit (HT): 42 % — NL (ref 40–52)
Hemoglobin (HGB) (HT): 14.3 g/dL — NL (ref 13.0–18.0)
Lymphocyte # (HT): 0.9 10 3/uL — ABNORMAL LOW (ref 1.0–4.8)
Lymphocyte % (HT): 5 % — ABNORMAL LOW (ref 15–45)
MCHC (HT): 34.4 g/dL — NL (ref 32.0–37.5)
MCV (HT): 95 fL — NL (ref 80–100)
Mean Corpuscular Hemoglobin (MCH) (HT): 32.7 pg — NL (ref 26.0–34.0)
Monocyte # (HT): 1.9 10 3/uL — ABNORMAL HIGH (ref 0.1–1.0)
Monocyte % (HT): 11 % — NL (ref 0–15)
Neutrophil # (HT): 14.1 10 3/uL — ABNORMAL HIGH (ref 1.8–8.0)
Platelets (HT): 257 10 3/uL — NL (ref 150–450)
RBC (HT): 4.37 10 6/uL — ABNORMAL LOW (ref 4.40–6.20)
RDW (HT): 12.6 % — NL (ref 0.0–15.2)
Seg Neut % (HT): 83 % — ABNORMAL HIGH (ref 45–75)
WBC (HT): 17 10 3/uL — ABNORMAL HIGH (ref 4.0–11.0)

## 2019-07-25 LAB — COMPREHENSIVE METABOLIC PANEL
A/G RATIO: 2.1 — ABNORMAL HIGH (ref 1.1–1.8)
ALT: 22 U/L (ref 10–49)
AST: 16 U/L (ref 7–37)
Albumin: 4.1 g/dL (ref 3.2–4.8)
Alk Phos: 70 U/L (ref 46–116)
Anion Gap: 9 meq/L (ref 4–16)
Bilirubin,Total: 1.6 mg/dL — ABNORMAL HIGH (ref 0.3–1.2)
CO2: 26 meq/L (ref 22–30)
Calcium: 8.9 mg/dL (ref 8.5–10.2)
Chloride: 103 meq/L (ref 98–108)
Creatinine: 0.9 mg/dL (ref 0.7–1.2)
Globulin: 2 g/dL — ABNORMAL LOW (ref 2.4–4.3)
Glucose: 114 mg/dL — ABNORMAL HIGH (ref 65–100)
Lab: 14 mg/dL (ref 8–20)
Potassium: 4.5 meq/L (ref 3.5–5.1)
Sodium: 138 meq/L (ref 135–145)
Total Protein: 6.1 g/dL (ref 5.7–8.2)

## 2019-07-25 LAB — CBC AND DIFFERENTIAL
Baso # K/uL: 0 10*3/uL (ref 0.0–0.2)
Basophil %: 0 % (ref 0–3)
Eos # K/uL: 0 10*3/uL (ref 0.0–0.6)
Eosinophil %: 0 % (ref 0–5)
Hematocrit: 42 % (ref 40–52)
Hemoglobin: 14.3 g/dL (ref 13.0–18.0)
Lymph # K/uL: 0.9 10*3/uL — ABNORMAL LOW (ref 1.0–4.8)
Lymphocyte %: 5 % — ABNORMAL LOW (ref 15–45)
MCH: 32.7 pg (ref 26.0–34.0)
MCHC: 34.4 g/dL (ref 32.0–37.5)
MCV: 95 fL (ref 80–100)
Mono # K/uL: 1.9 10*3/uL — ABNORMAL HIGH (ref 0.1–1.0)
Monocyte %: 11 % (ref 0–15)
Neut # K/uL: 14.1 10*3/uL — ABNORMAL HIGH (ref 1.8–8.0)
Platelets: 257 10*3/uL (ref 150–450)
RBC: 4.37 10*6/uL — ABNORMAL LOW (ref 4.40–6.20)
RDW: 12.6 % (ref 0.0–15.2)
Seg Neut %: 83 % — ABNORMAL HIGH (ref 45–75)
WBC: 17 10*3/uL — ABNORMAL HIGH (ref 4.0–11.0)

## 2019-07-25 LAB — COVID-19 NAAT (PCR): COVID-19 NAAT (PCR): NOT DETECTED

## 2019-07-25 LAB — TROPONIN T: Troponin T: 0.01 ng/mL (ref 0.00–0.09)

## 2019-07-25 LAB — ESTIMATED GFR
GFR,Black: 60 mL/min
GFR,Caucasian: 60 mL/min

## 2019-07-25 LAB — COVID-19 PCR

## 2019-07-25 LAB — LACTATE, PLASMA: Lactate: 1.3 mmol/L (ref 0.4–2.0)

## 2019-07-26 LAB — UNMAPPED LAB RESULTS
Basophil # (HT): 0 10 3/uL — NL (ref 0.0–0.2)
Basophil % (HT): 0 % — NL (ref 0–3)
Eosinophil # (HT): 0.2 10 3/uL — NL (ref 0.0–0.6)
Eosinophil % (HT): 1 % — NL (ref 0–5)
Hematocrit (HT): 34 % — ABNORMAL LOW (ref 40–52)
Hemoglobin (HGB) (HT): 11.7 g/dL — ABNORMAL LOW (ref 13.0–18.0)
Lymphocyte # (HT): 0.7 10 3/uL — ABNORMAL LOW (ref 1.0–4.8)
Lymphocyte % (HT): 6 % — ABNORMAL LOW (ref 15–45)
MCHC (HT): 34 g/dL — NL (ref 32.0–37.5)
MCV (HT): 96 fL — NL (ref 80–100)
Mean Corpuscular Hemoglobin (MCH) (HT): 32.7 pg — NL (ref 26.0–34.0)
Monocyte # (HT): 1.6 10 3/uL — ABNORMAL HIGH (ref 0.1–1.0)
Monocyte % (HT): 13 % — NL (ref 0–15)
Neutrophil # (HT): 9.3 10 3/uL — ABNORMAL HIGH (ref 1.8–8.0)
Platelets (HT): 210 10 3/uL — NL (ref 150–450)
RBC (HT): 3.58 10 6/uL — ABNORMAL LOW (ref 4.40–6.20)
RDW (HT): 12.8 % — NL (ref 0.0–15.2)
Seg Neut % (HT): 78 % — ABNORMAL HIGH (ref 45–75)
WBC (HT): 12 10 3/uL — ABNORMAL HIGH (ref 4.0–11.0)

## 2019-07-27 LAB — UNMAPPED LAB RESULTS
Hematocrit (HT): 35 % — ABNORMAL LOW (ref 40–52)
Hemoglobin (HGB) (HT): 11.8 g/dL — ABNORMAL LOW (ref 13.0–18.0)
MCHC (HT): 33.9 g/dL — NL (ref 32.0–37.5)
MCV (HT): 98 fL — NL (ref 80–100)
Mean Corpuscular Hemoglobin (MCH) (HT): 33.1 pg — NL (ref 26.0–34.0)
Platelets (HT): 235 10 3/uL — NL (ref 150–450)
RBC (HT): 3.57 10 6/uL — ABNORMAL LOW (ref 4.40–6.20)
RDW (HT): 12.7 % — NL (ref 0.0–15.2)
WBC (HT): 7.4 10 3/uL — NL (ref 4.0–11.0)

## 2019-07-28 ENCOUNTER — Telehealth: Payer: Self-pay | Admitting: Primary Care

## 2019-07-28 LAB — UNMAPPED LAB RESULTS
Hematocrit (HT): 35 % — ABNORMAL LOW (ref 40–52)
Hemoglobin (HGB) (HT): 11.6 g/dL — ABNORMAL LOW (ref 13.0–18.0)
MCHC (HT): 33.5 g/dL — NL (ref 32.0–37.5)
MCV (HT): 95 fL — NL (ref 80–100)
Mean Corpuscular Hemoglobin (MCH) (HT): 31.9 pg — NL (ref 26.0–34.0)
Platelets (HT): 241 10 3/uL — NL (ref 150–450)
RBC (HT): 3.64 10 6/uL — ABNORMAL LOW (ref 4.40–6.20)
RDW (HT): 12.4 % — NL (ref 0.0–15.2)
WBC (HT): 5.2 10 3/uL — NL (ref 4.0–11.0)

## 2019-07-28 NOTE — Telephone Encounter (Addendum)
For Thursday:  Pt's wife Romie Minus called, requested to speak with Manuela Schwartz, NP for an update on pt.  States pt is currently admitted at [facility] since 12/13.  Please call Romie Minus back.  Romie Minus is aware NP is out of the office today.

## 2019-07-28 NOTE — Telephone Encounter (Signed)
Thursday:  FYI:Pt is currently admitted at [facility] since 12/13 per his wife Romie Minus.

## 2019-07-29 LAB — UNMAPPED LAB RESULTS
Hematocrit (HT): 36 % — ABNORMAL LOW (ref 40–52)
Hemoglobin (HGB) (HT): 12.1 g/dL — ABNORMAL LOW (ref 13.0–18.0)
MCHC (HT): 34.1 g/dL — NL (ref 32.0–37.5)
MCV (HT): 95 fL — NL (ref 80–100)
Mean Corpuscular Hemoglobin (MCH) (HT): 32.3 pg — NL (ref 26.0–34.0)
Platelets (HT): 264 10 3/uL — NL (ref 150–450)
RBC (HT): 3.75 10 6/uL — ABNORMAL LOW (ref 4.40–6.20)
RDW (HT): 12.2 % — NL (ref 0.0–15.2)
WBC (HT): 6.6 10 3/uL — NL (ref 4.0–11.0)

## 2019-07-29 NOTE — Telephone Encounter (Signed)
Per pt. Wife - Urology got him in for bladder wash Friday with plan to do IV Monday.  Saturday - he felt like he had a kidney stone - severe pain.  Sunday fever 102.9 . Went to hospital by ambulance.  Admitted. Foley out,  picc line in "he will go home on IV antibiotics...thinking about releasing him tomorrow."    "I have a million concerns".  doesn't want him to be sent home too early.     nervous about him coming home with PICC and without foley.  (see wife's chart Romie Minus Swendsen DOB [birth year]02[birth year])  for detailed note about her anxiety.)    Encourage her to reach out to hospital nurses and doctors regarding her concerns.  They can involve social work to help set up visiting nurse at home to help with the PICC line, IV antibiotics, etc.  Also encouraged her to reach out to patient's urologist regarding her concerns about her husband also.

## 2019-07-30 ENCOUNTER — Telehealth: Payer: Self-pay | Admitting: Primary Care

## 2019-07-30 NOTE — Telephone Encounter (Signed)
Should be seen

## 2019-07-30 NOTE — Telephone Encounter (Signed)
HOSPITAL: ED: [place], ED, [facility], UC in-patient    HOSPITAL NAME: [facility] NAME:    DISCHARGED HOME: 12/18    DATE ADMITTED: 12/13    REASON FOR ADMISSION: pyelornephritis   DISCHARGE APPT SCHEDULED: 7-10 days    Call patient to schedule discharge.

## 2019-08-02 ENCOUNTER — Other Ambulatory Visit: Payer: Self-pay

## 2019-08-02 LAB — CRP: CRP: 0.801 — AB (ref <0.800)

## 2019-08-02 LAB — UNMAPPED LAB RESULTS
Hematocrit (HT): 36.6 % — ABNORMAL LOW (ref 40.0–52.0)
Hemoglobin (HGB) (HT): 12.6 g/dL — ABNORMAL LOW (ref 13.0–17.5)
MCHC (HT): 34.4 g/dL — NL (ref 32.0–36.0)
MCV (HT): 95.1 FL — NL (ref 81.0–99.0)
Mean Corpuscular Hemoglobin (MCH) (HT): 32.7 pg — NL (ref 26.0–34.0)
Platelets (HT): 345 10 3/uL — NL (ref 140–400)
RBC (HT): 3.85 10 6/uL — ABNORMAL LOW (ref 4.20–5.90)
RDW (HT): 12.2 % — NL (ref 11.5–15.0)
WBC (HT): 6.5 10 3/uL — NL (ref 4.0–10.8)

## 2019-08-02 LAB — COMPREHENSIVE METABOLIC PANEL
A/G RATIO: 1.5 (ref 1.1–1.8)
ALT: 119 U/L — ABNORMAL HIGH (ref 1–44)
AST: 33 U/L (ref 14–39)
Albumin: 3.7 g/dL (ref 3.5–5.2)
Alk Phos: 82 U/L (ref 53–129)
Bilirubin,Total: 0.6 mg/dL (ref 0.3–1.2)
CO2: 24 mmol/L (ref 20–31)
Calcium: 8.4 mg/dL — ABNORMAL LOW (ref 8.6–10.2)
Chloride: 107 mmol/L (ref 99–109)
Creatinine: 0.81 mg/dL (ref 0.70–1.30)
Globulin: 2.4 GM/DL (ref 1.6–3.6)
Glucose: 144 MG/DL — ABNORMAL HIGH (ref 60–140)
Lab: 19 mg/dL (ref 9–23)
Potassium: 4.4 mmol/L (ref 3.5–5.1)
Sodium: 138 mmol/L (ref 136–145)
Total Protein: 6.1 g/dL (ref 5.7–8.2)

## 2019-08-02 LAB — CBC
Hematocrit: 36.6 % — ABNORMAL LOW (ref 40.0–52.0)
Hemoglobin: 12.6 g/dL — ABNORMAL LOW (ref 13.0–17.5)
MCH: 32.7 PG (ref 26.0–34.0)
MCHC: 34.4 g/dL (ref 32.0–36.0)
MCV: 95.1 FL (ref 81.0–99.0)
Platelets: 345 10*3/uL (ref 140–400)
RBC: 3.85 10*6/uL — ABNORMAL LOW (ref 4.20–5.90)
RDW: 12.2 % (ref 11.5–15.0)
WBC: 6.5 10*3/uL (ref 4.0–10.8)

## 2019-08-02 LAB — SEDIMENTATION RATE, AUTOMATED: Sedimentation Rate: 43 mm/hour — ABNORMAL HIGH (ref 0–15)

## 2019-08-02 LAB — ESTIMATED GFR
GFR,Black: >60 mL/min/{1.73_m2} (ref >=60)
GFR,Caucasian: >60 mL/min/{1.73_m2} (ref >=60)

## 2019-08-02 NOTE — Telephone Encounter (Signed)
When should patient be seen? Discharge in chart.

## 2019-08-02 NOTE — Telephone Encounter (Signed)
Attempted to reach patient to follow up on hospital discharge and schedule follow up appointment, left message to call office back

## 2019-08-03 NOTE — Telephone Encounter (Signed)
Post-Discharge Follow-up-Transitional Care Management BILLING Phone Call      Hospital Admission Date/Discharge Date: 07/25/19-07/30/19        Discharge Diagnosis at the time of discharge:   UTI sepsis with Pseudomonas aeruginosa infection,  acute pyelonephritis   hydronephrosis   Rt kidney stone       Hospital Area Discharged From: [facility]     Are you feeling as good as when you left the hospital? Patient reports feeling better since being discharged. Temp yesterday 98.1. Patient was has single lumen PICC in Right Basilic in place. Patients wife administers Cefepime 2G every 8 hrs for 2 weeks end date 12/28. Patient has weekly labs (CBC, CMP, ESR, CRP) scheduled while on antibiotic, last done 12/21. Says nurse from [facility] is due to change picc line dressing on 12/24.       Do you have questions regarding discharge instructions? no      Do you have an appointment with your PCP? yes  Future Appointments   Date Time Provider [place]   08/09/2019 11:45 AM Dobrzynski, [name], MD [facility] None         Do you have transportation to get to that appointment? yes      Do you have all of the medications listed on your after visit summary/discharge instructions and are you taking them? yes  ceFEPIme 2 gram IV injection Inject 8 mLs into the vein every 8 (eight) hours for 10 days., Starting Fri 07/30/2019, Until Mon 08/09/2019, Normal     heparin flush, porcine, 10 unit/mL IV injection 5 mLs by Intercatheter route 3 (three) times daily for 10 days., Starting Fri 07/30/2019, Until Mon 08/09/2019, Normal     HYDROcodone-acetaminophen 5-325 mg Oral per tablet Take 1 tablet by mouth every 6 (six) hours as needed for Pain. Max Daily Amount: 4 tablets., Starting Fri 07/30/2019, Normal     loperamide 2 mg Oral tablet Take 1 tablet by mouth as needed for Diarrhea. Takes 1/2 tab (64m/day), Starting Fri 07/30/2019, No Print     sodium chloride Inj injection 10 mLs by INTRA-CATHETER route 3 (three) times daily for 10 days.,  Starting Fri 07/30/2019, Until Mon 08/09/2019, Normal    atorvastatin 20 MG Oral tablet Take 20 mg by mouth every morning., Historical Med     dofetilide 500 MCG Oral capsule Take 1 capsule by mouth every 12 (twelve) hours., Starting 06/19/2012, Until Discontinued, Normal     ibuprofen 200 MG Oral tablet Take 200 mg by mouth as needed for Pain., Historical Med     magnesium oxide 400 mg Oral tablet Take 500 mg by mouth daily., Historical Med     metoprolol SUCCINATE 100 MG Oral 24 hr EXT REL tab Take 50 mg by mouth every morning., Starting Tue 08/17/2013, Historical Med     multivitamin Oral capsule Take 1 capsule by mouth daily. , Until Discontinued, Historical Med     phenazopyridine (PYRIDIUM) 200 MG Oral tablet Take 1 tablet by mouth 3 (three) times daily as needed for Pain., Starting Mon 06/28/2019, Normal        Do you have any questions or concerns about your medications? no

## 2019-08-09 ENCOUNTER — Encounter: Payer: Self-pay | Admitting: Primary Care

## 2019-08-09 ENCOUNTER — Ambulatory Visit: Payer: Medicare (Managed Care) | Admitting: Primary Care

## 2019-08-09 ENCOUNTER — Encounter: Payer: Self-pay | Admitting: Gastroenterology

## 2019-08-09 ENCOUNTER — Other Ambulatory Visit: Payer: Self-pay

## 2019-08-09 VITALS — BP 122/58 | HR 68 | Temp 98.0°F | Ht 74.0 in | Wt 241.2 lb

## 2019-08-09 DIAGNOSIS — N200 Calculus of kidney: Secondary | ICD-10-CM

## 2019-08-09 DIAGNOSIS — A4152 Sepsis due to Pseudomonas: Secondary | ICD-10-CM

## 2019-08-09 NOTE — Progress Notes (Signed)
Telephone    07/30/2019  [facility]   [name], [name], MD  Internal Medicine  Discharge Follow Up , Transition Care Managment(TCM)  Reason for call   Conversation: Discharge Follow Up  (Newest Message First)  August 03, 2019  [name]  to Me         Post-Discharge Follow-up-Transitional Care Management BILLING Phone Call      Hospital Admission Date/Discharge Date: 07/25/19-07/30/19       Discharge Diagnosis at the time of discharge:   UTI sepsis with Pseudomonas aeruginosa infection,  acute pyelonephritis   hydronephrosis   Rt kidney stone      Hospital Area Discharged From: [facility]     Are you feeling as good as when you left the hospital? Patient reports feeling better since being discharged. Temp yesterday 98.1. Patient was has single lumen PICC in Right Basilic in place. Patients wife administers Cefepime 2G every 8 hrs for 2 weeks end date 12/28. Patient has weekly labs (CBC, CMP, ESR, CRP) scheduled while on antibiotic, last done 12/21. Says nurse from [facility] is due to change picc line dressing on 12/24.       Do you have questions regarding discharge instructions? no      Do you have an appointment with your PCP? yes         Future Appointments   Date Time Provider [name]   08/09/2019 11:45 AM Jadalyn Oliveri, [name], MD [facility] None         Do you have transportation to get to that appointment? yes      Do you have all of the medications listed on your after visit summary/discharge instructions and are you taking them? yes  ceFEPIme 2 gram IV injection Inject 8 mLs into the vein every 8 (eight) hours for 10 days., Starting Fri 07/30/2019, Until Mon 08/09/2019, Normal     heparin flush, porcine, 10 unit/mL IV injection 5 mLs by Intercatheter route 3 (three) times daily for 10 days., Starting Fri 07/30/2019, Until Mon 08/09/2019, Normal     HYDROcodone-acetaminophen 5-325 mg Oral per tablet Take 1 tablet by mouth every 6 (six) hours as needed for Pain.  Max Daily Amount: 4 tablets., Starting Fri 07/30/2019, Normal     loperamide 2 mg Oral tablet Take 1 tablet by mouth as needed for Diarrhea. Takes 1/2 tab (57m/day), Starting Fri 07/30/2019, No Print     sodium chloride Inj injection 10 mLs by INTRA-CATHETER route 3 (three) times daily for 10 days., Starting Fri 07/30/2019, Until Mon 08/09/2019, Normal   atorvastatin 20 MG Oral tablet Take 20 mg by mouth every morning., Historical Med     dofetilide 500 MCG Oral capsule Take 1 capsule by mouth every 12 (twelve) hours., Starting 06/19/2012, Until Discontinued, Normal     ibuprofen 200 MG Oral tablet Take 200 mg by mouth as needed for Pain., Historical Med     magnesium oxide 400 mg Oral tablet Take 500 mg by mouth daily., Historical Med     metoprolol SUCCINATE 100 MG Oral 24 hr EXT REL tab Take 50 mg by mouth every morning., Starting Tue 08/17/2013, Historical Med     multivitamin Oral capsule Take 1 capsule by mouth daily. , Until Discontinued, Historical Med     phenazopyridine (PYRIDIUM) 200 MG Oral tablet Take 1 tablet by mouth 3 (three) times daily as needed for Pain., Starting Mon 06/28/2019, Normal       Do you have any questions or concerns  about your medications? no              Maxwell Wells is a very pleasant 73 year old gentleman with a complicated medical history.  He is here for a follow-up of her recent hospitalization at [facility].  He had Pseudomonas sepsis from urinary source.  He recently completed a 2-week course of cefepime.  He has a PICC line in that may be discontinued after blood work is done today.  He has a follow-up scheduled with urology but to his knowledge he has no infectious disease follow-up.  No recurrent fevers.  Unfortunately, he is basically incontinent.  He is undergoing hyperbaric oxygen treatment for persistent gross hematuria secondary to radiation cystitis.    Medications reviewed and updated in EMR  List reflects end of visit medication list.  Problem list  updated.  Current Outpatient Medications   Medication Sig Dispense Refill    cefuroxime (CEFTIN) 250 MG tablet Take 250 mg by mouth 2 times daily      loperamide (IMODIUM) 2 MG capsule Take 2 mg by mouth daily      atorvastatin (LIPITOR) 20 MG tablet       metoprolol (TOPROL-XL) 100 MG 24 hr tablet Take 50 mg by mouth daily         TIKOSYN 500 MCG capsule 500 mcg 2 times daily         Multiple Vitamin (MULTIVITAMIN) per tablet Take 1 tablet by mouth daily       No current facility-administered medications for this visit.      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  Blood pressure 122/58, pulse 68, temperature 36.7 C (98 F), temperature source Temporal, height 1.88 m (6' 2"), weight 109.4 kg (241 lb 3.2 oz), SpO2 98 %.      Pleasant gentleman who actually looks quite well.  Skin warm and dry.  Not examined further today.    Hospital records were reviewed in detail.    73 year old gentleman with a complicated medical history comes to our office today for follow-up.  He has completed a 2-week course of cefepime for Pseudomonas sepsis.  He looks fairly well.  He will get blood work today.  Has urology follow-up.  I did not make any changes to his regimen.  He does have some right knee pain and is status post knee replacement.  Given that he has a prosthetic knee I told him to contact me should he develop any increased pain, redness, and swelling of that knee after stopping antibiotics.  Follow-up physical exam next year

## 2019-08-10 LAB — COMPREHENSIVE METABOLIC PANEL
A/G RATIO: 1.7 (ref 1.1–1.8)
ALT: 38 U/L (ref 1–44)
AST: 21 U/L (ref 14–39)
Albumin: 3.8 g/dL (ref 3.5–5.2)
Alk Phos: 71 U/L (ref 53–129)
Bilirubin,Total: 0.6 mg/dL (ref 0.3–1.2)
CO2: 26 mmol/L (ref 20–31)
Calcium: 8.7 mg/dL (ref 8.6–10.2)
Chloride: 109 mmol/L (ref 99–109)
Creatinine: 0.64 mg/dL — ABNORMAL LOW (ref 0.70–1.30)
Globulin: 2.3 GM/DL (ref 1.6–3.6)
Glucose: 101 MG/DL (ref 60–140)
Lab: 20 mg/dL (ref 9–23)
Potassium: 5 mmol/L (ref 3.5–5.1)
Sodium: 140 mmol/L (ref 136–145)
Total Protein: 6.1 g/dL (ref 5.7–8.2)

## 2019-08-10 LAB — UNMAPPED LAB RESULTS
Hematocrit (HT): 36.7 % — ABNORMAL LOW (ref 40.0–52.0)
Hemoglobin (HGB) (HT): 12.5 g/dL — ABNORMAL LOW (ref 13.0–17.5)
MCHC (HT): 34.1 g/dL — NL (ref 32.0–36.0)
MCV (HT): 95.8 FL — NL (ref 81.0–99.0)
Mean Corpuscular Hemoglobin (MCH) (HT): 32.6 pg — NL (ref 26.0–34.0)
Platelets (HT): 300 10 3/uL — NL (ref 140–400)
RBC (HT): 3.83 10 6/uL — ABNORMAL LOW (ref 4.20–5.90)
RDW (HT): 12.3 % — NL (ref 11.5–15.0)
WBC (HT): 4.9 10 3/uL — NL (ref 4.0–10.8)

## 2019-08-10 LAB — CBC
Hematocrit: 36.7 % — ABNORMAL LOW (ref 40.0–52.0)
Hemoglobin: 12.5 g/dL — ABNORMAL LOW (ref 13.0–17.5)
MCH: 32.6 PG (ref 26.0–34.0)
MCHC: 34.1 g/dL (ref 32.0–36.0)
MCV: 95.8 FL (ref 81.0–99.0)
Platelets: 300 10*3/uL (ref 140–400)
RBC: 3.83 10*6/uL — ABNORMAL LOW (ref 4.20–5.90)
RDW: 12.3 % (ref 11.5–15.0)
WBC: 4.9 10*3/uL (ref 4.0–10.8)

## 2019-08-10 LAB — ESTIMATED GFR
GFR,Black: >60 mL/min/{1.73_m2} (ref >=60)
GFR,Caucasian: >60 mL/min/{1.73_m2} (ref >=60)

## 2019-08-10 LAB — SEDIMENTATION RATE, AUTOMATED: Sedimentation Rate: 17 mm/hour — ABNORMAL HIGH (ref 0–15)

## 2019-08-10 LAB — CRP: CRP: 0.053 (ref <0.800)

## 2019-08-16 LAB — UNMAPPED LAB RESULTS
Hematocrit (HT): 40 % — NL (ref 40–52)
Hemoglobin (HGB) (HT): 13.3 g/dL — NL (ref 13.0–17.5)
MCHC (HT): 33.3 g/dL — NL (ref 32.0–36.0)
MCV (HT): 96.4 fL — NL (ref 81.0–99.0)
Mean Corpuscular Hemoglobin (MCH) (HT): 32 pg — NL (ref 26.0–34.0)
Platelets (HT): 230 10 3/uL — NL (ref 140–400)
RBC (HT): 4.15 10 6/uL — ABNORMAL LOW (ref 4.20–5.90)
RDW (HT): 12.1 % — NL (ref 11.5–15.0)
WBC (HT): 5.6 10 3/uL — NL (ref 4.0–10.8)

## 2019-08-23 ENCOUNTER — Encounter: Payer: Self-pay | Admitting: Gastroenterology

## 2019-08-24 LAB — UNMAPPED LAB RESULTS
Basophil # (HT): 0.1 10 3/uL — NL (ref 0.0–0.2)
Basophil % (HT): 1 % — NL (ref 0–2)
Eosinophil # (HT): 0.2 10 3/uL — NL (ref 0.0–5.0)
Eosinophil % (HT): 3 % — NL (ref 0–7)
Hematocrit (HT): 41 % — NL (ref 40–52)
Hemoglobin (HGB) (HT): 13.8 g/dL — NL (ref 13.0–17.5)
Lymphocyte # (HT): 1.8 10 3/uL — NL (ref 0.9–3.8)
Lymphocyte % (HT): 28 % — NL (ref 17–44)
MCHC (HT): 33.4 g/dL — NL (ref 32.0–36.0)
MCV (HT): 96.3 fL — NL (ref 81.0–99.0)
Mean Corpuscular Hemoglobin (MCH) (HT): 32.2 pg — NL (ref 26.0–34.0)
Monocyte # (HT): 0.9 10 3/uL — NL (ref 0.2–1.0)
Monocyte % (HT): 14 % — ABNORMAL HIGH (ref 4–12)
Neutrophil # (HT): 3.6 10 3/uL — NL (ref 1.8–8.0)
Platelets (HT): 225 10 3/uL — NL (ref 140–400)
RBC (HT): 4.29 10 6/uL — NL (ref 4.20–5.90)
RDW (HT): 12.6 % — NL (ref 11.5–15.0)
Seg Neut % (HT): 54 % — NL (ref 40–75)
WBC (HT): 6.6 10 3/uL — NL (ref 4.0–10.8)

## 2019-08-25 ENCOUNTER — Encounter: Payer: Self-pay | Admitting: Primary Care

## 2019-09-09 ENCOUNTER — Encounter: Payer: Self-pay | Admitting: Gastroenterology

## 2019-09-17 ENCOUNTER — Other Ambulatory Visit: Payer: Self-pay | Admitting: Pulmonary Disease

## 2019-09-17 ENCOUNTER — Encounter: Payer: Self-pay | Admitting: Gastroenterology

## 2019-09-17 ENCOUNTER — Encounter: Payer: Self-pay | Admitting: Primary Care

## 2019-09-17 DIAGNOSIS — Z23 Encounter for immunization: Secondary | ICD-10-CM

## 2019-09-18 ENCOUNTER — Other Ambulatory Visit: Payer: Self-pay | Admitting: Primary Care

## 2019-09-18 DIAGNOSIS — R310 Gross hematuria: Secondary | ICD-10-CM

## 2019-09-18 DIAGNOSIS — A4152 Sepsis due to Pseudomonas: Secondary | ICD-10-CM

## 2019-09-18 NOTE — Telephone Encounter (Signed)
[facility]--dr rashid--referral made

## 2019-09-27 ENCOUNTER — Encounter: Payer: Self-pay | Admitting: Gastroenterology

## 2019-09-28 ENCOUNTER — Ambulatory Visit: Payer: Medicare (Managed Care) | Admitting: Internal Medicine

## 2019-09-28 ENCOUNTER — Encounter: Payer: Self-pay | Admitting: Internal Medicine

## 2019-09-28 VITALS — BP 132/72 | HR 76 | Temp 98.5°F | Ht 74.0 in | Wt 246.0 lb

## 2019-09-28 DIAGNOSIS — R42 Dizziness and giddiness: Secondary | ICD-10-CM

## 2019-09-28 NOTE — Progress Notes (Signed)
CC: felt dizzy    HPI:    In Oct and Nov with position changes started having bouts of what he suspects were vertigo- he will get that sensation a couple of times a day for a second or two rather randomly- little by little less all the time. Has meclizine but has not needed because sensation is so brief.  Tends toward motion sickness.     Last week on Friday he had a different sensation - more like lightheadedness with looking down, getting up suddenly (if was bent forward specifically) each time lasted until he stood up straight.  He checked his BP and did not seem different seated versus standing. Again - this has improved and at this point pretty much absent.     Hx afib. He suspects he was in afib when he was having that sensation - he thinks he's in afib about half the time;  Not on [facility] (hematuria).     Having hyperbaric treatment for bladder, and received a covid vaccine both last week.     Today feels fine. He changes positions with care due to potential for lightheadedness.  Has some significant variability in BP at readings taken daily during bariatric treatment.  States SBP can vary as much as 80's - 150 range.he does not feel this lightheadedness during treatment - but he's pretty much laying still for prolonged period - post treatment he feels fine.     No HA or weakness.       Past Medical History:   Diagnosis Date    AF (atrial fibrillation)     Prostate cancer 10/18/2010    Renal cell cancer 10/18/2010     Allergies   Allergen Reactions    Food Anaphylaxis     Bananas       Current Outpatient Medications   Medication Sig    fosfomycin (MONUROL) 3 g packet Take 3 g by mouth once a week    loperamide (IMODIUM) 2 MG capsule Take 2 mg by mouth daily    atorvastatin (LIPITOR) 20 MG tablet     metoprolol (TOPROL-XL) 100 MG 24 hr tablet Take 50 mg by mouth daily       TIKOSYN 500 MCG capsule 500 mcg 2 times daily       Multiple Vitamin (MULTIVITAMIN) per tablet Take 1 tablet by mouth daily        O:  Vitals:    09/28/19 1134   BP: 132/72   Pulse: 76   Temp: 36.9 C (98.5 F)   Weight: 111.6 kg (246 lb)   Height: 1.88 m (6\' 2" )   seated BP 132/60  Standing 114/50.  In afib.    General: Well-appearing, conversant male  HEENT: Normocephalic, atraumatic; EOM intact, conjunctiva WNL;  Bilateral auditory canals WNL.  Bilateral TMs translucent;   oropharynx moist, neck supple.  Cardiovascular: irregular rhythm, S1 S2 no LE edema.   Musculoskeletal: Moving all extremities, ambulatory - rises and ambulates normal posture and gait.  Bends forward in office with absence of symptoms today.   Skin: clean, dry and intact.  Psychiatric: Mood and affect WNL.    A: 33  yom presents  with complaint of brief episodes when leaning forward sometimes of vertigo other times of lightheadedness.  In afib at controlled rate, normotensive (sdoes drop BP 18 points upon standing but is asymptomatic with that change. Normal coordination. Appears well. No HA or weakness.     DDX includes: vertigo, positional. Orthostatic BP.  Afib.  P: discussed likely etiology and treatment vertigo, BP changes, caution changing positions. Persistent or progressive symptoms he will call.

## 2019-09-28 NOTE — Patient Instructions (Signed)
You do drop your blood pressure about 16 points upon standing. You are in afib but rate controlled.     Vertigo is usually middle-ear related - we talked about that.     If your symptoms increase to cause more concern of course call.

## 2019-10-01 ENCOUNTER — Encounter: Payer: Self-pay | Admitting: Gastroenterology

## 2019-10-05 ENCOUNTER — Encounter: Payer: Self-pay | Admitting: Gastroenterology

## 2019-10-18 ENCOUNTER — Other Ambulatory Visit: Payer: Self-pay | Admitting: Urology

## 2019-10-18 DIAGNOSIS — R3989 Other symptoms and signs involving the genitourinary system: Secondary | ICD-10-CM

## 2019-10-18 NOTE — Progress Notes (Signed)
Urology New Patient Visit         Chief complaint: HIstory of prostate cancer, history of stones, history of infected artificial urinary sphincter    History of present illness: History of prostate cancer - s/p RALP in 2004, EBRT in 2005 no recurrence since. Had AMS sphincter. History of stones- had obstructing stone. Had surgery, developed infection of sphincter - had explant and dealing with infections since. Seeing ID and on fosfomycin. Uses 12 pads a day since sphincter out. Drinks 100 oz of water a day- decaf coffee. Has hematuria still. Is undergoing hyperbaric oxygen.     Medications:   Current Outpatient Medications   Medication    fosfomycin (MONUROL) 3 g packet    loperamide (IMODIUM) 2 MG capsule    atorvastatin (LIPITOR) 20 MG tablet    metoprolol (TOPROL-XL) 100 MG 24 hr tablet    TIKOSYN 500 MCG capsule    Multiple Vitamin (MULTIVITAMIN) per tablet     No current facility-administered medications for this visit.       Allergies:   Allergies   Allergen Reactions    Food Anaphylaxis     Bananas         Past medical history:   Past Medical History:   Diagnosis Date    AF (atrial fibrillation)     Prostate cancer 10/18/2010    Renal cell cancer 10/18/2010       Past surgical history:   Past Surgical History:   Procedure Laterality Date    PARTIAL NEPHRECTOMY      PROSTATE SURGERY         Family history:   Family History   Problem Relation Age of Onset    High Blood Pressure Mother     Heart Disease Father     High cholesterol Father     High Blood Pressure Father        Social History:   Social History     Socioeconomic History    Marital status: Married     Spouse name: Not on file    Number of children: Not on file    Years of education: Not on file    Highest education level: Not on file   Occupational History    Not on file   Tobacco Use    Smoking status: Former Smoker     Packs/day: 0.50     Years: 15.00     Pack years: 7.50    Smokeless tobacco: Never Used   Substance and Sexual  Activity    Alcohol use: Yes     Comment: social    Drug use: Never    Sexual activity: Not on file   Social History Narrative    Not on file         The patient denies:  Reviewed. Pertinent ROS documented in HPI    Vital signs: There were no vitals taken for this visit.    Physical examination:  GENERAL:  No acute distress, well developed, well nourished  PSYCHIATRIC:  Normal mood and affect  BACK/ORTHO:  No costovertebral angle tenderness.  No tenderness of the axial skeleton.  No obvious back deformities.  ABDOMINAL:  Abdomen soft,nontender, nondistended, and without masses;    EXTREMITIES:  Without cyanosis, clubbing, or edema. Calves nontender  GU: GENITOURINARY: Normal phallus with normal meatus.  No meatal discharge.  Cutaneous examination of phallus and scrotum unremarkable.  Bilateral testes measure approximately 4 cm in the craniocaudad axis and are without suspicious masses  or tenderness.   No suspicious epididymal or cord masses/tenderness.   .     2020 CT scan  IMPRESSION:     1. Soft tissue edema within the perineum, scrotum, and pubic region with a couple of small foci of air near the midline raphae. Incompletely imaged 3.1 x 5.5 x 6.3 cm collection with mild peripheral enhancement within the scrotum/perineum, which may   represent a small abscess or hematoma/seroma. Findings are favored to represent a combination of postoperative change and cellulitis; however, Fournier gangrene is to be excluded clinically. Close continued follow-up is recommended.   2. Resolution of previously noted right hydronephrosis with distal obstructing ureteral calculus. Stable nonobstructing renal calculi versus milk of calcium.   3. A couple ofsmall residual calculi near the bladder neck, measuring up to 4 mm.   4. Stable postablative changes in the left upper kidney.       PSA (eff. 11-2008)   Date Value Ref Range Status   04/26/2019 <0.02 0.00 - 4.00 ng/mL Final     Comment:     Total PSA, (performed by the  [employer]  electrochemiluminescence ECLIA) results are not absolute for  the presence or absence of malignant disease and the values  obtained with different assay methods or kits can not be used  interchangeably.     01/18/2019 <0.02 0.00 - 4.00 ng/mL Final     Comment:     Total PSA, (performed by the [employer]  electrochemiluminescence ECLIA) results are not absolute for  the presence or absence of malignant disease and the values  obtained with different assay methods or kits can not be used  interchangeably.     03/21/2018 <0.02 0.00 - 4.00 ng/mL Final     Comment:     Total PSA, (performed by the Roche [employer] 602  electrochemiluminescence ECLIA) results are not absolute for  the presence or absence of malignant disease and the values  obtained with different assay methods or kits can not be used  interchangeably.       Creatinine   Date Value Ref Range Status   08/09/2019 0.64 (L) 0.70 - 1.30 mg/dL Final   08/02/2019 0.81 0.70 - 1.30 mg/dL Final   07/25/2019 0.9 0.70 - 1.20 mg/dL Final     Testosterone   Date Value Ref Range Status   05/10/2019 107 (L) 193 - 740 ng/dL Final     Comment:     Tanner Stages Reference Ranges (ng/dL)                      Male      Male  Tanner Stage 1   < 3-6       < 3  Tanner Stage 2   < 3-10      < 3-432  Tanner Stage 3   < 3-24       65-778  Tanner Stage 4   < 3-27      180-763  Tanner Stage 5     5-38      188-882     04/26/2019 27 (L) 193 - 740 ng/dL Final     Comment:     Tanner Stages Reference Ranges (ng/dL)                      Male      Male  Tanner Stage 1   < 3-6       < 3  Tanner Stage  2   < 3-10      < 3-432  Tanner Stage 3   < 3-24       65-778  Tanner Stage 4   < 3-27      180-763  Tanner Stage 5     5-38      188-882     01/18/2019 249 193 - 740 ng/dL Final     Comment:     Tanner Stages Reference Ranges (ng/dL)                      Male      Male  Tanner Stage 1   < 3-6       < 3  Tanner Stage 2   < 3-10      < 3-432  Tanner Stage 3   < 3-24        65-778  Tanner Stage 4   < 3-27      180-763  Tanner Stage 5     5-38      188-882     03/21/2018 169 (L) 193 - 740 ng/dL Final     Comment:     Tanner Stages Reference Ranges (ng/dL)                      Male      Male  Tanner Stage 1   < 3-6       < 3  Tanner Stage 2   < 3-10      < 3-432  Tanner Stage 3   < 3-24       65-778  Tanner Stage 4   < 3-27      180-763  Tanner Stage 5     5-38      [id number]           2018  RESULT: 50% Calcium oxalate monohydrate    Discussion:  -Multiple urologic issues discussed with patient  -In regard to infected artificial urinary sphincter, told him it was uncommon to occur but Pseudomonas UTIs can occur after instrumentation. Unfortunately it led to current situation. Should continue follow-up infectious disease. We will continue fosfomycin. Discussed ways to obtain good urine culture that might have better potential to not show contamination especially with his degree of incontinence  -In regards to his incontinence, we talked about options with patient. I strongly advised him not to proceed with artificial urinary sphincter until complete assurance that no residual infection. He might benefit from pelvic MRI or repeat CT scan to check out the area. Would need to get good urine culture result as well. Risk of infection of artificial urinary sphincter can occur and would make subsequent management of incontinence more difficult. We talked about penile clamp in the interim as incontinence has been bothersome to patient. We briefly discussed urinary diversion which I would not pursue at this time  -In regards to stones, patient already drinks a large amount of fluid. Understands risk of recurrence with stone history. Potential repeat imaging with KUB in future or CT scan to delineate stone burden better  -In regards to prostate cancer, PSA is remained undetectable. Would continue yearly check may be for 4 to 5 years. Then would be 20 years from radiation with low chance of  recurrence. At this time can consider stopping PSA checks going forward  -In regards to radiation cystitis, once again discussed concern with his incontinence as he is aware that larger  catheters cannot be placed so sometimes management is difficult. If this continues to be a significant problem then once again can discuss cystectomy in future  Patient scheduled to see Dr. [name] tomorrow. Told him I can assist care at any time or once Dr Jeanmarie Plant    Assessment:   1. History of prostate cancer  2. History of stones  3. History of infected artificial urinary sphincter ([name] 2020)  4. Hematuria likely due to radiation cystitis    Plan:   1. Yearly PSA for 4 to 5 years then stop  2. Recommendation of ID clearance and further imaging prior to artificial urinary sphincter  3. Encourage p.o. fluid intake  4. KUB or CT scan in 1 year to assess stone burden  5. Follow-up when needed  6. Continue hyperbaric oxygen  Harden Mo, MD

## 2019-10-19 ENCOUNTER — Ambulatory Visit: Payer: Medicare (Managed Care) | Admitting: Urology

## 2019-10-19 VITALS — Ht 74.0 in | Wt 235.0 lb

## 2019-10-19 DIAGNOSIS — Z8546 Personal history of malignant neoplasm of prostate: Secondary | ICD-10-CM

## 2019-10-19 DIAGNOSIS — R310 Gross hematuria: Secondary | ICD-10-CM

## 2019-10-19 DIAGNOSIS — N200 Calculus of kidney: Secondary | ICD-10-CM

## 2019-10-19 DIAGNOSIS — N393 Stress incontinence (female) (male): Secondary | ICD-10-CM

## 2019-10-19 DIAGNOSIS — N3040 Irradiation cystitis without hematuria: Secondary | ICD-10-CM

## 2019-11-22 LAB — UNMAPPED LAB RESULTS
Hematocrit (HT): 47 % — NL (ref 40–52)
Hemoglobin (HGB) (HT): 15.8 g/dL — NL (ref 13.0–18.0)

## 2019-12-06 ENCOUNTER — Other Ambulatory Visit: Payer: Self-pay | Admitting: Gastroenterology

## 2019-12-14 ENCOUNTER — Encounter: Payer: Self-pay | Admitting: Gastroenterology

## 2019-12-15 ENCOUNTER — Encounter: Payer: Self-pay | Admitting: Primary Care

## 2019-12-16 NOTE — Telephone Encounter (Signed)
Deb, can you get report??

## 2019-12-17 LAB — UNMAPPED LAB RESULTS
Hematocrit (HT): 44 % — NL (ref 40–52)
Hemoglobin (HGB) (HT): 14.9 g/dL — NL (ref 13.0–17.5)
MCHC (HT): 34 g/dL — NL (ref 32.0–36.0)
MCV (HT): 96.1 fL — NL (ref 81.0–99.0)
Mean Corpuscular Hemoglobin (MCH) (HT): 32.7 pg — NL (ref 26.0–34.0)
Platelets (HT): 278 10 3/uL — NL (ref 140–400)
RBC (HT): 4.56 10 6/uL — NL (ref 4.20–5.90)
RDW (HT): 12.7 % — NL (ref 11.5–15.0)
WBC (HT): 5.8 10 3/uL — NL (ref 4.0–10.8)

## 2019-12-22 ENCOUNTER — Encounter: Payer: Self-pay | Admitting: Gastroenterology

## 2019-12-22 DIAGNOSIS — Z95818 Presence of other cardiac implants and grafts: Secondary | ICD-10-CM | POA: Insufficient documentation

## 2019-12-22 LAB — UNMAPPED LAB RESULTS
ABO RH Blood Type (HT): O NEG — NL
Antibody Screen (HT): NEGATIVE — NL
Basophil # (HT): 0.1 10 3/uL — NL (ref 0.0–0.2)
Basophil % (HT): 1 % — NL (ref 0–3)
Eosinophil # (HT): 0.1 10 3/uL — NL (ref 0.0–0.6)
Eosinophil % (HT): 2 % — NL (ref 0–5)
Hematocrit (HT): 43 % — NL (ref 40–52)
Hemoglobin (HGB) (HT): 14.3 g/dL — NL (ref 13.0–18.0)
Lymphocyte # (HT): 1.5 10 3/uL — NL (ref 1.0–4.8)
Lymphocyte % (HT): 24 % — NL (ref 15–45)
MCHC (HT): 33.6 g/dL — NL (ref 32.0–37.5)
MCV (HT): 97 fL — NL (ref 80–100)
Mean Corpuscular Hemoglobin (MCH) (HT): 32.5 pg — NL (ref 26.0–34.0)
Monocyte # (HT): 0.8 10 3/uL — NL (ref 0.1–1.0)
Monocyte % (HT): 12 % — NL (ref 0–15)
Neutrophil # (HT): 3.7 10 3/uL — NL (ref 1.8–8.0)
Platelets (HT): 220 10 3/uL — NL (ref 150–450)
RBC (HT): 4.4 10 6/uL — NL (ref 4.40–6.20)
RDW (HT): 13.4 % — NL (ref 0.0–15.2)
Seg Neut % (HT): 61 % — NL (ref 45–75)
WBC (HT): 6.2 10 3/uL — NL (ref 4.0–11.0)

## 2020-01-11 ENCOUNTER — Encounter: Payer: Self-pay | Admitting: Gastroenterology

## 2020-01-31 LAB — UNMAPPED LAB RESULTS
Hematocrit (HT): 46 % — NL (ref 40–52)
Hemoglobin (HGB) (HT): 15.6 g/dL — NL (ref 13.0–17.5)
MCHC (HT): 33.8 g/dL — NL (ref 32.0–36.0)
MCV (HT): 97.1 fL — NL (ref 81.0–99.0)
Mean Corpuscular Hemoglobin (MCH) (HT): 32.8 pg — NL (ref 26.0–34.0)
Platelets (HT): 267 10 3/uL — NL (ref 140–400)
RBC (HT): 4.76 10 6/uL — NL (ref 4.20–5.90)
RDW (HT): 12 % — NL (ref 11.5–15.0)
WBC (HT): 8.6 10 3/uL — NL (ref 4.0–10.8)

## 2020-02-03 ENCOUNTER — Other Ambulatory Visit: Payer: Self-pay | Admitting: Gastroenterology

## 2020-02-10 ENCOUNTER — Encounter: Payer: Self-pay | Admitting: Gastroenterology

## 2020-03-03 ENCOUNTER — Other Ambulatory Visit: Payer: Self-pay | Admitting: Gastroenterology

## 2020-03-06 ENCOUNTER — Other Ambulatory Visit: Payer: Self-pay | Admitting: Gastroenterology

## 2020-03-10 ENCOUNTER — Encounter: Payer: Self-pay | Admitting: Gastroenterology

## 2020-03-11 LAB — UNMAPPED LAB RESULTS
Hematocrit (HT): 38 % — ABNORMAL LOW (ref 40–52)
Hemoglobin (HGB) (HT): 12.9 g/dL — ABNORMAL LOW (ref 13.0–18.0)

## 2020-03-14 ENCOUNTER — Encounter: Payer: Medicare (Managed Care) | Admitting: Primary Care

## 2020-03-14 DIAGNOSIS — Z9689 Presence of other specified functional implants: Secondary | ICD-10-CM

## 2020-03-14 DIAGNOSIS — Z48816 Encounter for surgical aftercare following surgery on the genitourinary system: Secondary | ICD-10-CM

## 2020-03-14 DIAGNOSIS — I4891 Unspecified atrial fibrillation: Secondary | ICD-10-CM

## 2020-03-16 ENCOUNTER — Telehealth: Payer: Self-pay | Admitting: Primary Care

## 2020-03-16 NOTE — Telephone Encounter (Signed)
HOSPITAL: ED: [place], ED, [facility] [place] NAME:  [facility]    REHAB NAME:    DISCHARGED HOME: 7/31    DATE ADMITTED: 7/30    REASON FOR ADMISSION:  Urinary Trac Surgery    DISCHARGE APPT SCHEDULED:    Patient has a follow up appt with Urology on 03/22/20.

## 2020-03-21 NOTE — Telephone Encounter (Signed)
Pt called due to he was told at discharge he would need appt here so he called last week to see if needed - he never got a call back so called today and did confirm he has appt with Urology not sure if appt is needed here and I let him know that this correct that if Dr wanted appt here we would of called . He agreed to no appt here but is upset we did not call him to just let him know this

## 2020-03-27 ENCOUNTER — Encounter: Payer: Self-pay | Admitting: Gastroenterology

## 2020-04-10 ENCOUNTER — Encounter: Payer: Self-pay | Admitting: Primary Care

## 2020-04-24 ENCOUNTER — Encounter: Payer: Self-pay | Admitting: Gastroenterology

## 2020-05-02 ENCOUNTER — Other Ambulatory Visit: Payer: Self-pay | Admitting: Gastroenterology

## 2020-06-07 ENCOUNTER — Telehealth: Payer: Self-pay | Admitting: Primary Care

## 2020-06-07 NOTE — Telephone Encounter (Addendum)
Pt's wife Romie Minus called,   Pt is in [place] now  Feels lightheaded  When he looks down while walking, he feels unstable  Started on Friday  Pt  saw an Dr. today, who does not believe it's an ear infection  And that pt should get a  Brain Scan  Pt would like to talk to Dr. D to find out if he should fly back to [place]  Please advise and call Romie Minus  back

## 2020-06-08 NOTE — Telephone Encounter (Signed)
Maxwell Wells is calling again, please call her back

## 2020-06-15 ENCOUNTER — Other Ambulatory Visit: Payer: Self-pay | Admitting: Gastroenterology

## 2020-06-15 IMAGING — MR MRI BRAIN WITH  IAC W/WO CONTRAST
17 of 19 series · 43 of 48 positions shown · non-contrast
Comparison: No appropriate comparison exams at this facility.

MRI BRAIN WITH  IAC W/WO CONTRAST, 06/15/2020 [DATE]: 
CLINICAL INDICATION:  Dizziness and giddiness. Worse over the past few days. 
LEFT-sided hearing loss.
TECHNIQUE: Sagittal T1, axial T1, axial FLAIR, diffusion images, axial T2 and 
thin section axial FFE sequences obtained. Thin section axial T1 images through 
the IACs and fat-suppressed axial T1, coronal T1 images through the entire brain 
were obtained. As per [HOSPITAL] guidelines 3-D 
reconstructions are performed with concurrent physician supervision. 11 cc 
Gadovist injected intravenously. The patient's eGFR was calculated to be
using the i-STAT device.

[Series 101: smartbrain · sagittal · 2.2mm · 0.98mm/px · 5 of 92 slices shown (1 of 2)]
[im 1/92]
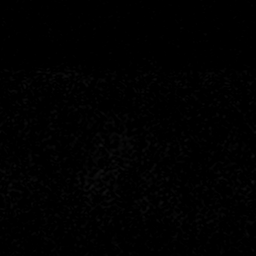
[im 23/92]
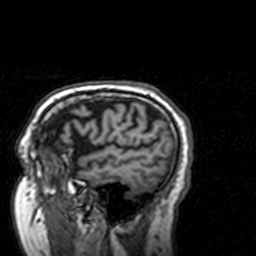
[im 46/92]
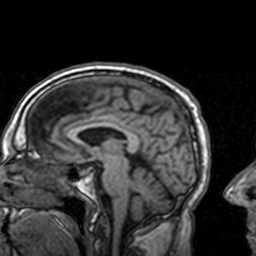
[im 69/92]
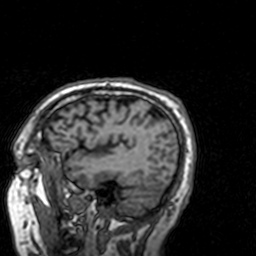
[im 92/92]
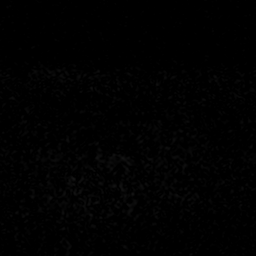

[Series 102: smartbrain · axial · 1.0mm · 0.98mm/px · 1 of 2 slices shown (2 of 2)]
[im 1/2]
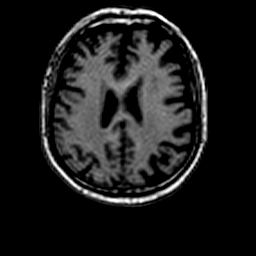

[Series 103: patient aligned mpr · axial · 15.6mm · 0.98mm/px · z∈[-109,+110]mm · 2 of 51 slices shown]
[im 1/51]
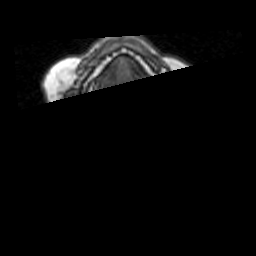
[im 51/51]
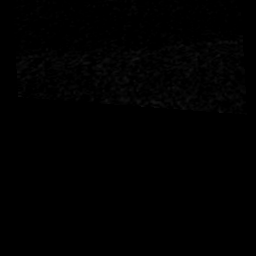

[Series 201: T1 · sagittal · 4.0mm · 0.86mm/px · 1 of 29 slices shown (1 of 2)]
[im 1/29]
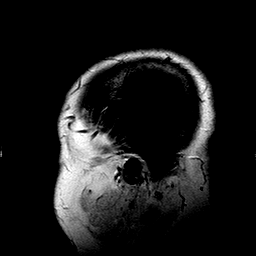

[Series 301: T1 · axial · 5.0mm · 0.57mm/px · 1 of 27 slices shown (2 of 2)]
[im 1/27]
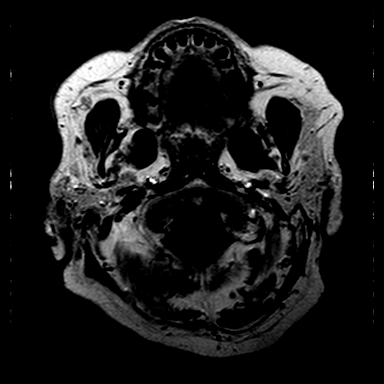

[Series 401: T2 · axial · 5.0mm · 0.39mm/px · z∈[-85,+71]mm · 2 of 27 slices shown]
[im 1/27]
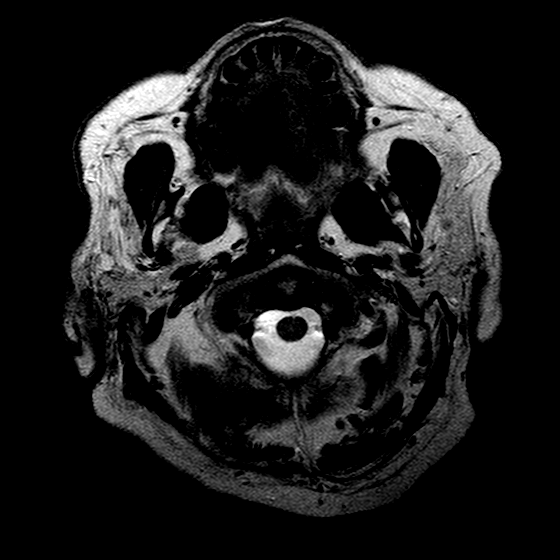
[im 27/27]
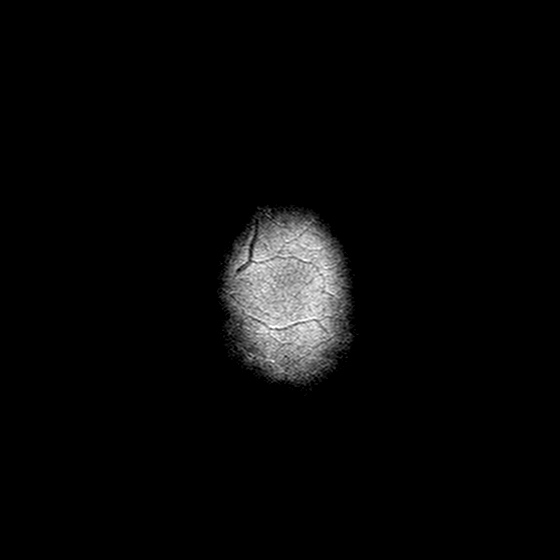

[Series 501: FLAIR · axial · 5.0mm · 0.76mm/px · z∈[-85,+71]mm · 2 of 27 slices shown]
[im 1/27]
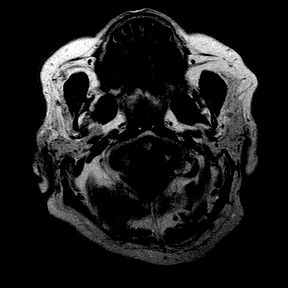
[im 27/27]
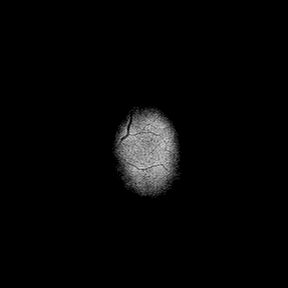

[Series 603: (id) · axial · 5.0mm · 1.80mm/px · z∈[-84,+71]mm · 2 of 27 slices shown]
[im 1/27]
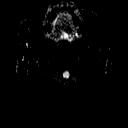
[im 27/27]
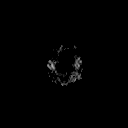

[Series 604: dadc map · axial · 5.0mm · 1.80mm/px · z∈[-84,+71]mm · 2 of 27 slices shown]
[im 1/27]
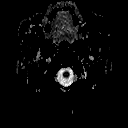
[im 27/27]
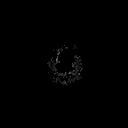

[Series 605: eeadc iso · axial · 5.0mm · 1.80mm/px · z∈[-84,+71]mm · 2 of 27 slices shown]
[im 1/27]
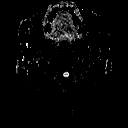
[im 27/27]
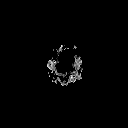

[Series 701: ven_bold · axial · 5.0mm · 0.41mm/px · z∈[-81,+66]mm · 4 of 60 slices shown]
[im 1/60]
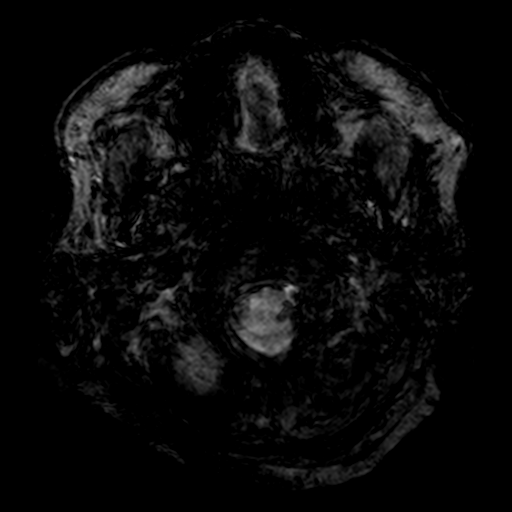
[im 20/60]
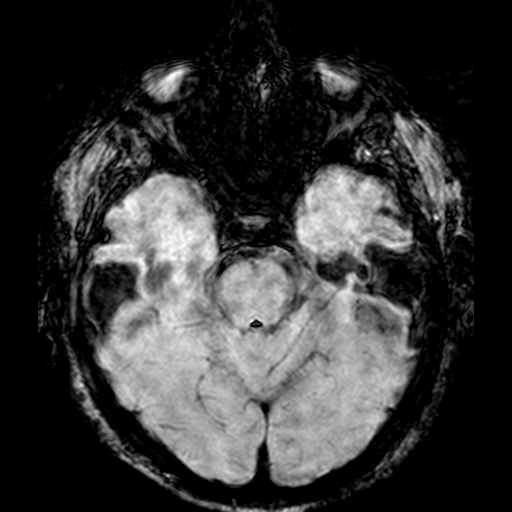
[im 40/60]
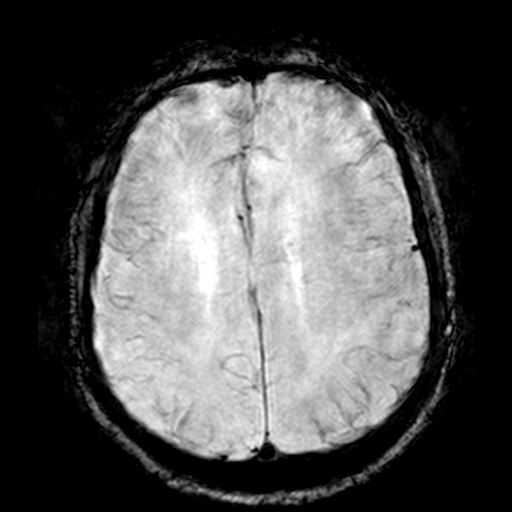
[im 60/60]
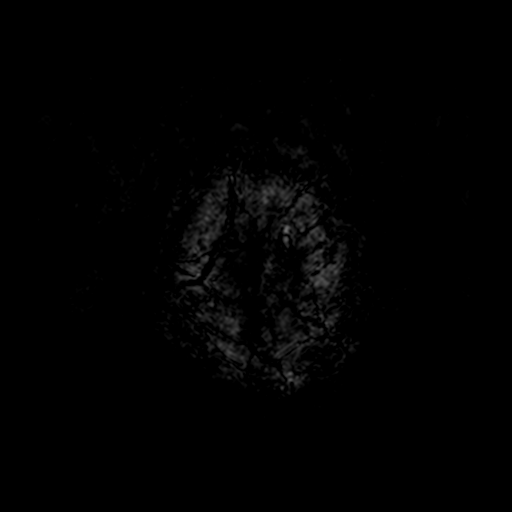

[Series 801: t2w_3d_drive · axial · 1.4mm · 0.51mm/px · z∈[-75,-41]mm · 3 of 50 slices shown]
[im 1/50]
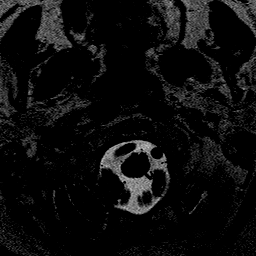
[im 25/50]
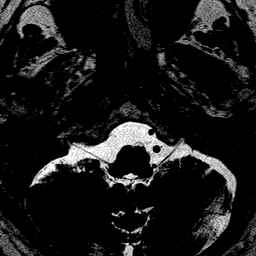
[im 50/50]
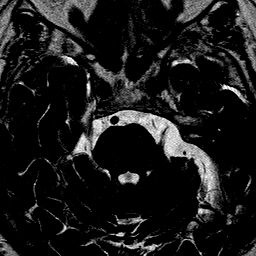

[Series 802: t-2 cor mpr · coronal · 0.9mm · 0.51mm/px · 2 of 25 slices shown]
[im 1/25]
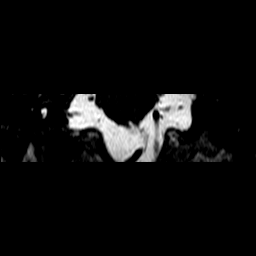
[im 25/25]
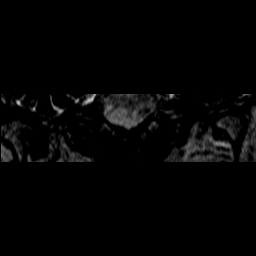

[Series 804: t-2 sg mpr · sagittal · 0.9mm · 0.51mm/px · 8 of 124 slices shown]
[im 1/124]
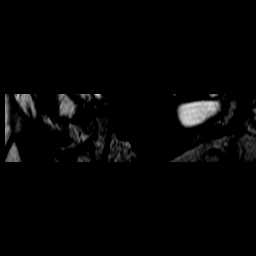
[im 18/124]
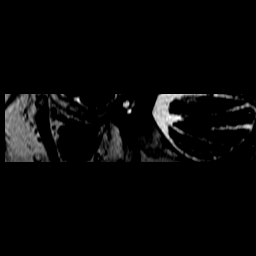
[im 36/124]
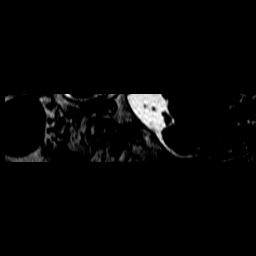
[im 53/124]
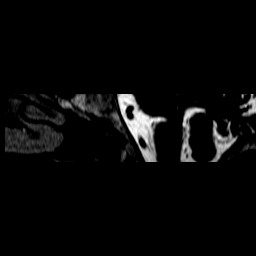
[im 71/124]
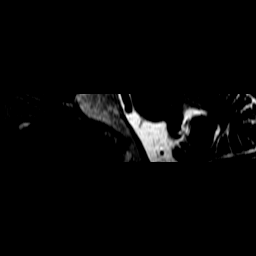
[im 88/124]
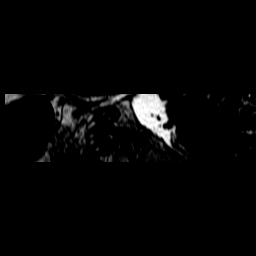
[im 106/124]
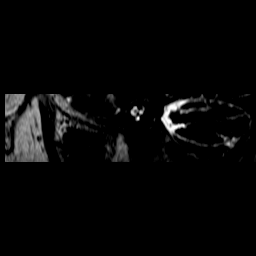
[im 124/124]
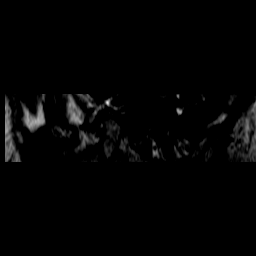

[Series 1001: T1 post-contrast · axial · 5.0mm · 0.57mm/px · z∈[-85,+71]mm · 2 of 27 slices shown (1 of 3)]
[im 1/27]
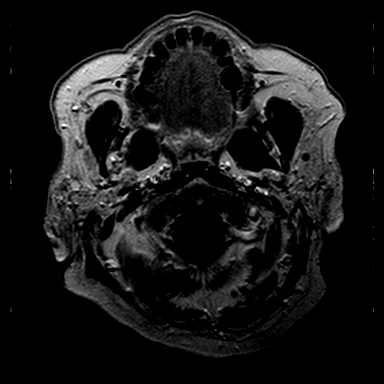
[im 27/27]
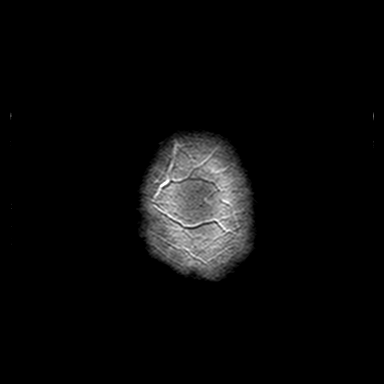

[Series 1101: T1 post-contrast · coronal · 4.0mm · 0.88mm/px · 2 of 37 slices shown (2 of 3)]
[im 1/37]
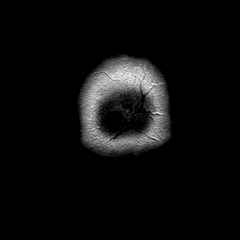
[im 37/37]
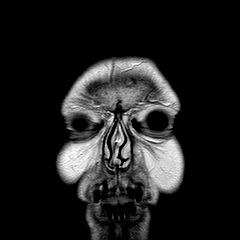

[Series 1301: T1 post-contrast · sagittal · 4.0mm · 0.86mm/px · 2 of 29 slices shown (3 of 3)]
[im 1/29]
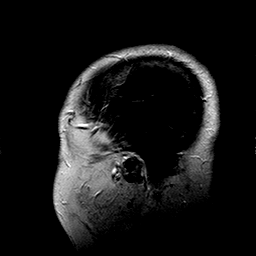
[im 29/29]
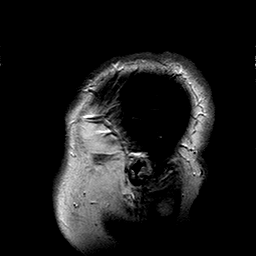

[43 of 48 positions shown; findings below may reference images not displayed]

FINDINGS: There is mild diffuse cerebral cortical atrophy consistent with patient age. 
Ventricles are normal in size for age. Minor chronic microangiopathic cerebral 
white matter changes are present. There is a ill-defined focus of T2 signal 
hyperintensity measuring 14 mm in maximum dimension involving the RIGHT side of 
the splenium of the corpus callosum. No associated mass effect or focal atrophy 
is identified. This lesion does not exhibit restricted diffusion or enhancement. 
The corpus callosum is otherwise unremarkable and normal in overall volume. No 
other focal parenchymal abnormalities of the brain are found elsewhere. No 
intracranial mass effect or extra-axial fluid collections are detected and there 
is no abnormal intracranial contrast enhancement. Normal flow void is in the 
major intracranial arteries. Diffusion and susceptibility sequences are normal. 
Incidentally imaged orbits are normal other than evidence of bilateral lens 
replacements. There is no MRI evidence for clinically significant degrees of 
mucosal pathology in the paranasal sinuses or LEFT otomastoid area. There is 
mild mucosal disease in the RIGHT mastoid tip. The nasopharynx is unremarkable. 
The visualized portions of the parotid glands are normal. The bone marrow of the 
base of the skull and incidentally imaged portions of the upper cervical spine 
are unremarkable. Pineal and pituitary areas are within normal limits. Posterior 
fossa structures are unremarkable. 
High-resolution images through the internal auditory canals are performed. No 
filling defects or abnormal enhancement are found along the 7th or 8th nerve 
complexes, cerebellopontine angle cisterns, or internal auditory canals. There 
is tortuosity of the LEFT vertebral artery potentially slightly impinging upon 
the nerve entry zone of the LEFT 7th and 8th nerves. If symptomatic this would 
potentially produce hemifacial spasm. Please correlate clinically. The other 
incidentally imaged intracisternal cranial nerves are unremarkable.
IMPRESSION: 1. Mild cortical atrophy is present. 
2. No evidence for vestibular schwannoma. There is contact of the tortuous LEFT 
vertebral artery with the nerve entry zone of the LEFT 7th and 8th nerve 
complexes. Please correlate clinically. If symptomatic this would potentially 
produce hemifacial spasm. 
3. There is a 14 mm T2 hyperintense lesion involving the RIGHT side of the 
splenium without atrophy, mass effect, restricted diffusion or enhancement. No 
similar lesions detected elsewhere. The differential diagnosis for this lesion 
is extensive including glioma, lymphoma, seizure, demyelinating lesion, 
ischemic, viral, posttraumatic, toxic encephalopathy and metabolic disturbance. 
In this case the patient complains only of chronic dizziness which is unlikely 
to be related to this lesion which is therefore likely an incidental discovery. 
Presuming there are no known underlying conditions such as any of the above I 
would suggest only repeating an MR examination of the brain in 2 months to 
exclude the possibility of a growing neoplasm.

## 2020-06-16 ENCOUNTER — Encounter: Payer: Self-pay | Admitting: Primary Care

## 2020-07-04 ENCOUNTER — Encounter: Payer: Self-pay | Admitting: Primary Care

## 2020-07-04 ENCOUNTER — Ambulatory Visit: Payer: Medicare (Managed Care) | Admitting: Primary Care

## 2020-07-04 VITALS — BP 122/62 | HR 78 | Temp 98.2°F | Ht 74.02 in | Wt 251.0 lb

## 2020-07-04 DIAGNOSIS — I10 Essential (primary) hypertension: Secondary | ICD-10-CM

## 2020-07-04 DIAGNOSIS — Z1159 Encounter for screening for other viral diseases: Secondary | ICD-10-CM

## 2020-07-04 DIAGNOSIS — Z0000 Encounter for general adult medical examination without abnormal findings: Secondary | ICD-10-CM

## 2020-07-04 DIAGNOSIS — I480 Paroxysmal atrial fibrillation: Secondary | ICD-10-CM

## 2020-07-04 DIAGNOSIS — R42 Dizziness and giddiness: Secondary | ICD-10-CM

## 2020-07-05 ENCOUNTER — Encounter: Payer: Self-pay | Admitting: Primary Care

## 2020-07-05 NOTE — Patient Instructions (Signed)
Thank you for completing your Subsequent Annual Medicare Visit and lightheaded w/bending over   with [place] today.     The purpose of this visits was to:     Screen for disease   Assess risk of future medical problems   Help develop a healthy lifestyle   Update vaccines   Get to know your doctor in case of an illness    Patient Care Team:  Blythe Stanford, MD as PCP - General  [name] Minette Brine, MD as Surgeon  Humberto Seals, MD (Hematology and Oncology)  Tawanna Solo, NP (Oncology)  Fayette Pho, RN as Registered Nurse (Oncology)     Medicare 5 Year Plan    The following items were identified as areas of concern during your screening today:  BMI greater than 25 - This is a risk for Heart Attack, Stroke, High Blood Pressure, Diabetes, High Cholesterol and other complications.       The Health Maintenance table below identifies screening tests and immunizations recommended by your health care team:  Health Maintenance: These screening recommendations are based on USPSTF, [employer], and [place] state guidelines   Topic Date Due    HIV Screening  Never done    Hepatitis C Screening  Never done    Shingles Vaccine (1 of 2) Never done    COVID-19 Vaccine (4 - Booster for Pfizer series) 11/14/2020    DEPRESSION SCREEN YEARLY  07/04/2021    Fall Risk Screening  07/04/2021    Colon Cancer Screening  06/27/2022    Flu Shot  Completed    Adult Prevnar Vaccination  Completed    Adult Pneumovax Vaccine  Completed    Abdominal Aortic Aneurysm Screening  Completed     In addition, goals and orders placed to address these recommendations are listed in the "Today's Visit" section.    We wish you the best of health and look forward to seeing you again next year for your Annual Medicare Wellness Visit.     If you have any health care concerns before then, please do not hesitate to contact us.

## 2020-07-05 NOTE — Progress Notes (Signed)
Maxwell Wells is a very pleasant 74-year-old gentleman who comes to our office today for a routine office visit.  As part of today's visit, a Medicare wellness assessment was performed by my staff.  It is documented below my note.  Maxwell Wells has been in [place] earlier this month.  He had some dizziness, almost like he was going to pass out briefly especially when looking down.  He also had some movement suggestive of vertigo.  He was evaluated in [place].  Ultimately, he had an MRI scan which did show some nonspecific changes.  Report reviewed with him.  Currently, he states that he feels back to normal.  No headaches.  Dizziness resolved.  No other focal neurologic symptoms.    In reviewing his record, he did have a similar episode and saw our nurse practitioner, Karen, last February.    Medications reviewed and updated in EMR  List reflects end of visit medication list.  Problem list updated.  Current Outpatient Medications   Medication Sig Dispense Refill   • loperamide (IMODIUM) 2 MG capsule Take 2 mg by mouth daily     • atorvastatin (LIPITOR) 20 MG tablet      • metoprolol (TOPROL-XL) 100 MG 24 hr tablet Take 50 mg by mouth daily        • Multiple Vitamin (MULTIVITAMIN) per tablet Take 1 tablet by mouth daily       No current facility-administered medications for this visit.     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  Blood pressure 122/62, pulse 78, temperature 36.8 °C (98.2 °F), height 1.88 m (6' 2.02"), weight 113.9 kg (251 lb), SpO2 98 %.      Pleasant gentleman in no apparent distress.  He looks well.  Skin warm and dry.  Head neck exam normal.  No  carotid bruits.  Extraocular movements intact.  No nystagmus.  Other cranial nerves normal.  Finger-to-nose normal.  Negative Romberg.  Can tandem walk although he is a little unsteady.    Records reviewed from [place] including MRI scan.    Pleasant 74-year-old gentleman comes to our office today for evaluation of dizziness.  His symptoms have largely resolved.  I do think this is most likely related to benign positional vertigo.  Cardiovascular status seems stable.  No focal neurologic symptoms.  He does have an abnormal MRI scan.  It was recommended that he have a 2-month follow-up.  He will be going back to [place] and I encouraged him to get a repeat MRI in [place] in 2 to 3 months.  Be happy to talk to him regarding the results by phone or via email.  He does have a complicated medical history.        Visit performed as:           Office Visit, met with patient in person    Today we reviewed and updated Maxwell Wells’s smoking status, activities of daily living, depression screen, fall risk, medications and allergies.   I have counseled the patient in the above areas.       Subjective:     Chief Complaint: Maxwell Wells is a 74 y.o. male here for a/an Subsequent Annual Medicare Visit and lightheaded w/bending over    In general, Maxwell Wells rates their overall health as:  good      Patient Care Team:  Dobrzynski, David M, MD as PCP - General  Valvo, John R, MD as Surgeon  Sahasrabudhe, Deepak M, MD (Hematology and Oncology)  Sievert, Lynn, NP (Oncology)  Santacesaria, Natalie, RN as Registered Nurse (Oncology)     Current Outpatient Medications on File Prior to Visit   Medication Sig Dispense Refill   • loperamide (IMODIUM) 2 MG capsule Take 2 mg by mouth daily     • atorvastatin (LIPITOR) 20 MG tablet      • metoprolol (TOPROL-XL) 100 MG 24 hr tablet Take 50 mg by mouth daily        • Multiple Vitamin (MULTIVITAMIN) per tablet Take 1 tablet by mouth daily       No current facility-administered  medications on file prior to visit.     Allergies   Allergen Reactions   • Food Anaphylaxis     Bananas       Patient Active Problem List    Diagnosis Date Noted   • Presence of Watchman left atrial appendage closure device 12/22/2019     Formatting of this note might be different from the original.  24 mm watchman flex     • Atrial fibrillation 11/26/2011   • Other and unspecified hyperlipidemia 08/23/2011   • Prostate cancer 10/18/2010   • Renal cell cancer 10/18/2010   • Male Erectile Disorder 04/29/2005     Created by Conversion       • Arthritis 04/29/2005     Created by Conversion       • Nephrolithiasis 04/29/2005     Created by Conversion       • Post Prostatectomy 04/29/2005     Created by Conversion         Past Medical History:   Diagnosis Date   • AF (atrial fibrillation)    • Prostate cancer 10/18/2010   • Renal cell cancer 10/18/2010     Past Surgical History:   Procedure Laterality Date   • PARTIAL NEPHRECTOMY     • PROSTATE SURGERY       Family History   Problem Relation Age of Onset   • High Blood Pressure Mother    • Heart Disease Father    • High cholesterol Father    • High Blood Pressure Father      Social History     Socioeconomic History   • Marital status: Married     Spouse name: Not on file   • Number of children: Not on file   • Years of education: Not on file   • Highest education level: Not on file   Occupational History   • Not on file   Tobacco Use   • Smoking status: Former Smoker     Packs/day: 0.50     Years: 15.00     Pack years: 7.50   • Smokeless tobacco: Never Used   Substance and Sexual Activity   • Alcohol use: Yes     Comment: social   • Drug use: Never   • Sexual activity: Not on file   Social History Narrative   • Not on file       Objective:     Vital Signs: BP   122/62    Pulse 78    Temp 36.8 C (98.2 F)    Ht 1.88 m (6' 2.02")    Wt 113.9 kg (251 lb)    SpO2 98%    BMI 32.21 kg/m    BMI: Body mass index is 32.21 kg/m.    Vision Screening Results (Welcome visit only):  No  exam data present    Depression Screening Results:  Recent Review Flowsheet Data     PHQ Scores 07/04/2020 04/26/2019 11/21/2016    PSQ2 Q1 - Interest/Pleasure - N N    PSQ2 Q2 - Down, Depressed, Hopeless - N N    PHQ Calculated Score 0 - -        Opioid Use/DAST- 10 Screening Results:   No data recorded  Activities of Daily Living/Functional Screening Results:  Is the person deaf or does he/she have serious difficulty hearing?: Y  *Hearing Status: HOH  Is this person blind or does he/she have serious difficulty seeing even when wearing glasses?: N  Does this person have serious difficulty walking or climbing stairs?: Y (knees)  *Walks in Home: Independent  *Climbing Stairs: Independent  Does this person have difficulty dressing or bathing?: N  *Shopping: Independent  *House Keeping: Independent  *Managing Own Medications: Independent  *Handling Finances: Independent  Difficulty doing errands due to a physicial, mental or emotional condition: No  Difficulty remembering or making decisions due to a physicial, mental or emotional condition: No      Fall Risk Screening Results:  Have you fallen in the last year?: No  Do you feel you are at risk for falling?: No      Assessment and Plan:     Cognitive Function:  Recall of recent and remote events appears:  Normal      Advanced Care Planning:  Reviewed     The following health maintenance plan was reviewed with the patient:    Health Maintenance Topics with due status: Overdue       Topic Date Due    HIV Screening USPSTF/[place] Never done    Hepatitis C Screening USPSTF/[place] Never done    IMM-ZOSTER Never done     Health Maintenance Topics with due status: Not Due       Topic Last Completion Date    Colon Cancer Screening Other 06/27/2017    COVID-19 Vaccine 05/16/2020    DEPRESSION SCREEN YEARLY 07/04/2020    Fall Risk Screening 07/04/2020     Health Maintenance Topics with due status: Completed       Topic Last Completion Date    AAA Screening USPSTF 08/01/2014    IMM-PREVNAR  VACCINE 65 + YRS 05/01/2015    IMM-PNEUMOVAX VACCINE 65 + YRS 05/17/2016    IMM-INFLUENZA 05/12/2020     This health maintenance schedule, identified risks, a list of orders placed today and patient goals have been provided to [place] Krogstad in the after visit summary.     Plan for any concerns identified during screening or risk assessments:  n/a

## 2020-07-11 ENCOUNTER — Ambulatory Visit: Payer: Medicare (Managed Care)

## 2020-07-11 ENCOUNTER — Other Ambulatory Visit
Admission: RE | Admit: 2020-07-11 | Discharge: 2020-07-11 | Disposition: A | Discharge status: Home / Self Care | Payer: Medicare (Managed Care) | Source: Ambulatory Visit | Attending: Primary Care | Admitting: Primary Care

## 2020-07-11 DIAGNOSIS — Z20828 Contact with and (suspected) exposure to other viral communicable diseases: Secondary | ICD-10-CM | POA: Insufficient documentation

## 2020-07-11 DIAGNOSIS — Z20822 Contact with and (suspected) exposure to covid-19: Secondary | ICD-10-CM | POA: Insufficient documentation

## 2020-07-11 NOTE — Progress Notes (Signed)
Patient presents today for COVID testing only. The patient was evaluated by the provider prior to arrival.     The following type of sample was collected.    Nasopharyngeal swab obtained and sent for analysis.    Sary Bogie, RN

## 2020-07-12 LAB — COVID-19 PCR

## 2020-07-12 LAB — COVID-19 NAAT (PCR): COVID-19 NAAT (PCR): NEGATIVE

## 2020-07-24 ENCOUNTER — Ambulatory Visit: Payer: Medicare (Managed Care) | Attending: Primary Care

## 2020-07-24 ENCOUNTER — Encounter: Payer: Self-pay | Admitting: Primary Care

## 2020-07-24 DIAGNOSIS — Z20822 Contact with and (suspected) exposure to covid-19: Secondary | ICD-10-CM | POA: Insufficient documentation

## 2020-07-24 DIAGNOSIS — Z20828 Contact with and (suspected) exposure to other viral communicable diseases: Secondary | ICD-10-CM | POA: Insufficient documentation

## 2020-07-24 NOTE — Progress Notes (Signed)
Patient presents today for COVID testing only. The patient was evaluated by the provider prior to arrival.     The following type of sample was collected.    Nasopharyngeal swab obtained and sent for analysis.    Lacole Komorowski, RN

## 2020-07-24 NOTE — Telephone Encounter (Signed)
Patient scheduled for drive thru covid testing today at 2:10pm.

## 2020-07-24 NOTE — Telephone Encounter (Signed)
Can you covid test him??

## 2020-07-25 LAB — COVID-19 PCR

## 2020-07-25 LAB — COVID-19 NAAT (PCR): COVID-19 NAAT (PCR): NEGATIVE

## 2020-11-15 ENCOUNTER — Telehealth: Payer: Self-pay | Admitting: Primary Care

## 2020-11-15 NOTE — Telephone Encounter (Signed)
Voicemail left asking Royetta Asal to call [phone] & ask for Patty.

## 2020-11-15 NOTE — Telephone Encounter (Addendum)
Maxwell Wells has [employer], HMO/ POS which includes Out of Area Benefits. DR Pieter Partridge is Out-of-Network with [employer]. Below referral information was relayed to [facility] @ [employer]. Contract IS NOT ELIGIBLE FOR LEVEL 1 REVIEW. Call transferred to customer care: Maxwell Wells: royce would be eligible for 80 % coverage up to allowed amount & patient would be responsible for 20 %. [facility] is participating with [name] of [place], not sure what network claim would be submitted through. Patient may have copay, dependant on how claim is submitted and processed through [place] [employer] [place]. Referral isn't available through this policy. Call reference # is: [id number].

## 2020-11-15 NOTE — Telephone Encounter (Signed)
Patient needs an insurance referral:     Doctor:Joseph Risk manager at [facility]     FWB:[id number]   Diagnosis: M25.562 CPT codes: 40698/ 61483(0 visits) inj: 20610/ J1040(1 visit) & x-rays: 73543(0 visits)     Date of Service: 11/17/2020     Insurance Number: Ayesha Mohair [id number]  URGENT REFERRAL REQUEST!

## 2020-11-16 NOTE — Telephone Encounter (Signed)
Voicemail left informing patient that Policy doesn't require Out-of-Network referral. Claim processing depends on how [place] submits claim to [place] [facility] [place]. Call refernce # is [id number].

## 2020-11-16 NOTE — Telephone Encounter (Signed)
[facility] calling to speak with you. Please call back

## 2020-11-16 NOTE — Telephone Encounter (Signed)
[place] has [employer], HMO/ POS which includes Out of Area Benefits. DR Pieter Partridge is Out-of-Network with [employer]. Below referral information was relayed to [facility] @ [employer]. Contract IS NOT ELIGIBLE FOR LEVEL 1 REVIEW. Call transferred to customer care: Gerald Stabs: marquies would be eligible for 80 % coverage up to allowed amount & patient would be responsible for 20 %. [facility] is participating with [name] of [place], not sure what network claim would be submitted through. Patient may have copay, dependant on how claim is submitted and processed through [place] of [place]. Referral isn't available through this policy. Call reference # is: [id number].

## 2020-11-16 NOTE — Telephone Encounter (Signed)
Referral request along with telephone encounter documenting referrall sttus faxed to [phone].

## 2020-11-16 NOTE — Telephone Encounter (Signed)
Voicemail left asking for return call if necessary. Referral response was faxed to [phone] at 11/16/2020 @ 9:26 AM, confirmation received at 9:50 AM.

## 2020-11-28 ENCOUNTER — Other Ambulatory Visit: Payer: Self-pay | Admitting: Gastroenterology

## 2020-12-15 ENCOUNTER — Other Ambulatory Visit
Admission: RE | Admit: 2020-12-15 | Discharge: 2020-12-15 | Disposition: A | Discharge status: Home / Self Care | Payer: Medicare (Managed Care) | Source: Ambulatory Visit

## 2020-12-15 ENCOUNTER — Other Ambulatory Visit
Admission: RE | Admit: 2020-12-15 | Discharge: 2020-12-15 | Disposition: A | Discharge status: Home / Self Care | Payer: Medicare (Managed Care) | Source: Ambulatory Visit | Attending: Oncology | Admitting: Oncology

## 2020-12-15 DIAGNOSIS — R002 Palpitations: Secondary | ICD-10-CM | POA: Insufficient documentation

## 2020-12-15 DIAGNOSIS — I2510 Atherosclerotic heart disease of native coronary artery without angina pectoris: Secondary | ICD-10-CM | POA: Insufficient documentation

## 2020-12-15 DIAGNOSIS — C61 Malignant neoplasm of prostate: Secondary | ICD-10-CM

## 2020-12-15 DIAGNOSIS — I5032 Chronic diastolic (congestive) heart failure: Secondary | ICD-10-CM | POA: Insufficient documentation

## 2020-12-15 LAB — CBC AND DIFFERENTIAL
Baso # K/uL: 0.1 10*3/uL (ref 0.0–0.1)
Basophil %: 0.8 %
Eos # K/uL: 0.3 10*3/uL (ref 0.0–0.5)
Eosinophil %: 3.1 %
Hematocrit: 48 % (ref 40–51)
Hemoglobin: 16.1 g/dL (ref 13.7–17.5)
IMM Granulocytes #: 0 10*3/uL (ref 0.0–0.0)
IMM Granulocytes: 0.5 %
Lymph # K/uL: 2 10*3/uL (ref 1.3–3.6)
Lymphocyte %: 24.9 %
MCH: 33 pg — ABNORMAL HIGH (ref 26–32)
MCHC: 33 g/dL (ref 32–37)
MCV: 100 fL — ABNORMAL HIGH (ref 79–92)
Mono # K/uL: 0.8 10*3/uL (ref 0.3–0.8)
Monocyte %: 10.1 %
Neut # K/uL: 4.8 10*3/uL (ref 1.8–5.4)
Nucl RBC # K/uL: 0 10*3/uL (ref 0.0–0.0)
Nucl RBC %: 0 /100 WBC (ref 0.0–0.2)
Platelets: 271 10*3/uL (ref 150–330)
RBC: 4.8 MIL/uL (ref 4.6–6.1)
RDW: 12.8 % (ref 11.6–14.4)
Seg Neut %: 60.6 %
WBC: 8 10*3/uL (ref 4.2–9.1)

## 2020-12-15 LAB — COMPREHENSIVE METABOLIC PANEL
ALT: 25 U/L (ref 0–50)
ALT: 25 U/L (ref 0–50)
AST: 21 U/L (ref 0–50)
AST: 22 U/L (ref 0–50)
Albumin: 4.3 g/dL (ref 3.5–5.2)
Albumin: 4.3 g/dL (ref 3.5–5.2)
Alk Phos: 85 U/L (ref 40–130)
Alk Phos: 86 U/L (ref 40–130)
Anion Gap: 11 (ref 7–16)
Anion Gap: 11 (ref 7–16)
Bilirubin,Total: 0.6 mg/dL (ref 0.0–1.2)
Bilirubin,Total: 0.6 mg/dL (ref 0.0–1.2)
CO2: 25 mmol/L (ref 20–28)
CO2: 25 mmol/L (ref 20–28)
Calcium: 9.3 mg/dL (ref 8.6–10.2)
Calcium: 9.3 mg/dL (ref 8.6–10.2)
Chloride: 105 mmol/L (ref 96–108)
Chloride: 105 mmol/L (ref 96–108)
Creatinine: 0.97 mg/dL (ref 0.67–1.17)
Creatinine: 1 mg/dL (ref 0.67–1.17)
Glucose: 109 mg/dL — ABNORMAL HIGH (ref 60–99)
Glucose: 109 mg/dL — ABNORMAL HIGH (ref 60–99)
Lab: 23 mg/dL — ABNORMAL HIGH (ref 6–20)
Lab: 23 mg/dL — ABNORMAL HIGH (ref 6–20)
Potassium: 4.6 mmol/L (ref 3.3–5.1)
Potassium: 4.6 mmol/L (ref 3.3–5.1)
Sodium: 141 mmol/L (ref 133–145)
Sodium: 141 mmol/L (ref 133–145)
Total Protein: 6.8 g/dL (ref 6.3–7.7)
Total Protein: 6.9 g/dL (ref 6.3–7.7)
eGFR BY CREAT: 79 *
eGFR BY CREAT: 82 *

## 2020-12-15 LAB — LIPID PANEL
Chol/HDL Ratio: 2.8
Cholesterol: 145 mg/dL
HDL: 52 mg/dL (ref 40–60)
LDL Calculated: 78 mg/dL
Non HDL Cholesterol: 93 mg/dL
Triglycerides: 77 mg/dL

## 2020-12-15 LAB — NEUTROPHIL #-INSTRUMENT: Neutrophil #-Instrument: 4.8 10*3/uL

## 2020-12-15 LAB — TESTOSTERONE (ADULT MALES OR INDIVIDUALS ON TESTOSTERONE THERAPY): Testosterone: 144 ng/dL — ABNORMAL LOW (ref 193–740)

## 2020-12-15 LAB — CBC
Hematocrit: 48 % (ref 40–51)
Hemoglobin: 16.1 g/dL (ref 13.7–17.5)
MCH: 34 pg — ABNORMAL HIGH (ref 26–32)
MCHC: 34 g/dL (ref 32–37)
MCV: 100 fL — ABNORMAL HIGH (ref 79–92)
Platelets: 270 10*3/uL (ref 150–330)
RBC: 4.8 MIL/uL (ref 4.6–6.1)
RDW: 12.9 % (ref 11.6–14.4)
WBC: 8.1 10*3/uL (ref 4.2–9.1)

## 2020-12-15 LAB — TSH: TSH: 2.17 u[IU]/mL (ref 0.27–4.20)

## 2020-12-15 LAB — PSA (EFF.4-2010): PSA (eff. 4-2010): <0.02 ng/mL (ref 0.00–4.00)

## 2021-01-22 ENCOUNTER — Encounter: Payer: Self-pay | Admitting: Primary Care

## 2021-01-22 ENCOUNTER — Ambulatory Visit: Payer: Medicare (Managed Care) | Admitting: Primary Care

## 2021-01-22 VITALS — BP 130/96 | HR 85 | Temp 98.5°F | Ht 72.02 in | Wt 253.0 lb

## 2021-01-22 DIAGNOSIS — H8112 Benign paroxysmal vertigo, left ear: Secondary | ICD-10-CM

## 2021-01-22 NOTE — Progress Notes (Signed)
Chief Complaint   Patient presents with    Dizziness     HPI  Maxwell Wells comes in for concerns of dizziness. This started 1 week ago. It seemed to go away on Friday. Looking down would create the symptoms. States when looking down does not really spin, as he keeps this from happening, but he has looked down and will get a spinning - actually brings up he passes out for 1 second.     Cardiology told him to stop if this happened again - actually went away in 1 week this time.         Medications were reviewed and reconciled with the patient; and No changes were made.    Current Outpatient Medications   Medication Sig Dispense Refill    loperamide (IMODIUM) 2 MG capsule Take 2 mg by mouth daily      atorvastatin (LIPITOR) 20 MG tablet       metoprolol (TOPROL-XL) 100 MG 24 hr tablet Take 50 mg by mouth daily         Multiple Vitamin (MULTIVITAMIN) per tablet Take 1 tablet by mouth daily       No current facility-administered medications for this visit.       PE  BP (!) 130/96    Pulse 85    Temp 36.9 C (98.5 F) (Temporal)    Ht 1.829 m (6' 0.02")    Wt 114.8 kg (253 lb)    SpO2 97%    BMI 34.29 kg/m     GEN: NAD  HEENT: Conjunctiva non-erythematous, MMM  CV: irregular, JVP not elevated, no leg edema  Pulm: CTAB, breathing nonlabored    Assessment & plan    1. BPPV (benign paroxysmal positional vertigo), left - recurrent issues with this, did go away as of Friday - metoprolol was stopped however not certain how that would play into this, will likely restart metoprolol, I don't have a great reason for why it keeps coming other then this will happen, does need an MRI as never did a repeat - however the spot seen was felt to be not related, sent message to PCP to determine next actions.     2.      Afib - will talk with cardiology but should be able to restart metoprolol       Perrin Maltese, MD 01/22/2021 10:52 AM

## 2021-01-25 LAB — UNMAPPED LAB RESULTS
Hematocrit (HT): 45 % (ref 40–52)
Hemoglobin (HGB) (HT): 15.3 g/dL (ref 13.0–17.5)
MCHC (HT): 34.2 g/dL (ref 32.0–36.0)
MCV (HT): 95.3 fL (ref 81.0–99.0)
Mean Corpuscular Hemoglobin (MCH) (HT): 32.6 pg (ref 26.0–34.0)
Platelets (HT): 259 10 3/uL (ref 140–400)
RBC (HT): 4.7 10 6/uL (ref 4.20–5.90)
RDW (HT): 12.4 % (ref 11.5–15.0)
WBC (HT): 8.1 10 3/uL (ref 4.0–10.8)

## 2021-01-29 ENCOUNTER — Other Ambulatory Visit: Payer: Self-pay | Admitting: Gastroenterology

## 2021-03-22 ENCOUNTER — Encounter: Payer: Self-pay | Admitting: Primary Care

## 2021-03-22 ENCOUNTER — Ambulatory Visit: Payer: Medicare (Managed Care) | Admitting: Primary Care

## 2021-03-22 VITALS — BP 146/74 | HR 80 | Temp 97.5°F | Wt 258.0 lb

## 2021-03-22 DIAGNOSIS — M109 Gout, unspecified: Secondary | ICD-10-CM

## 2021-03-22 MED ORDER — PREDNISONE 20 MG PO TABS *I*
ORAL_TABLET | ORAL | 0 refills | Status: DC
Start: 2021-03-22 — End: 2021-05-07

## 2021-03-22 NOTE — Progress Notes (Signed)
[facility]   Progress Note    Reason For Visit:   Chief Complaint   Patient presents with    Wrist Pain       Subjective:      Maxwell Wells is 75 y.o. year old male with past medical history of paroxysmal atrial fibrillation not on aspirin, history of prostate cancer status post prostatectomy as well as remote RCC status post partial nephrectomy here today with concern for wrist pain.    Starting 8 days ago Wednesday night. Played golf that day, no trauma. Was hurting, but woke up at night and was really hurting. Tried ice with no improvement.   Went by UC on Monday, thought was likely tendonitis. Taking 800 mg ibuprofen q 6. Has been resting and using cold. Feels a bit better now in the day, but bad in the night to he point of waking up.   Not sure of any new swelling. Has been wearing a band, just took it off.   Ibuprofen is helping.     Medications:     Current Outpatient Medications on File Prior to Visit   Medication Sig Dispense Refill    loperamide (IMODIUM) 2 MG capsule Take 2 mg by mouth daily      atorvastatin (LIPITOR) 20 MG tablet       metoprolol (TOPROL-XL) 100 MG 24 hr tablet Take 50 mg by mouth daily         Multiple Vitamin (MULTIVITAMIN) per tablet Take 1 tablet by mouth daily       No current facility-administered medications on file prior to visit.       Medications were reviewed and reconciled today.   Allergies:     Allergies   Allergen Reactions    Food Anaphylaxis     Bananas         Review of Systems:     Pertinent positives and negatives as per HPI.     Physical Exam:     There were no vitals filed for this visit.  Wt Readings from Last 3 Encounters:   01/22/21 114.8 kg (253 lb)   07/04/20 113.9 kg (251 lb)   10/19/19 106.6 kg (235 lb)     BP Readings from Last 3 Encounters:   01/22/21 (!) 130/96   07/04/20 122/62   09/28/19 132/72       General: In no apparent distress.  Alert, oriented, very pleasant.  Nontoxic-appearing.  MSK: Right wrist and hand with mild  edema increased compared to the contralateral side.  There is faint erythema on the dorsal aspect of the right wrist.  Tenderness to palpation over the ulnar aspect of the wrist in particular.  No heat.    Assessment and Plan:     Maxwell Wells was seen today for right wrist pain.  This did exacerbate after a day of golf without any other clear trauma, and certainly tendinitis is not unreasonable.  Nevertheless, the continued severity of the pain as well as swelling after 1 week is notable and I do wonder about gout.  He has not had any similar symptoms in this joint before (though does report very remote history of gout elsewhere), and quality exam is not notable for heat, there is mild erythema and swelling.  We will empirically trial prednisone taper and I have asked him to keep me updated closely on any developments.  If no improvement, would consider additional imaging, starting with plain films.    Diagnoses and all orders  for this visit:    Gout, unspecified cause, unspecified chronicity, unspecified site  -     predniSONE (DELTASONE) 20 mg tablet; Take 60 mg (3 pills) for 3 days, 40 mg (2 pills) daily for 3 days, 20 mg (1 pill) daily for 2 days, and 10 mg (half pill) daily for 4 days.        Follow up as needed.     Pryor Curia, MD 03/22/2021 1:37 PM

## 2021-05-07 ENCOUNTER — Ambulatory Visit: Payer: Medicare (Managed Care) | Admitting: Primary Care

## 2021-05-07 ENCOUNTER — Other Ambulatory Visit: Payer: Self-pay

## 2021-05-07 ENCOUNTER — Encounter: Payer: Self-pay | Admitting: Primary Care

## 2021-05-07 VITALS — BP 130/84 | HR 57 | Temp 96.9°F | Ht 72.0 in | Wt 257.1 lb

## 2021-05-07 DIAGNOSIS — Z23 Encounter for immunization: Secondary | ICD-10-CM

## 2021-05-07 DIAGNOSIS — M109 Gout, unspecified: Secondary | ICD-10-CM

## 2021-05-07 MED ORDER — PREDNISONE 20 MG PO TABS *I*
ORAL_TABLET | ORAL | 0 refills | Status: DC
Start: 2021-05-07 — End: 2022-01-29

## 2021-05-07 NOTE — Progress Notes (Signed)
[facility]   Progress Note    Reason For Visit:   Chief Complaint   Patient presents with    Wrist Pain     right       Subjective:      Maxwell Wells is 75 y.o. year old male with past medical history that has included atrial fibrillation status post watchman closure, recent gout here today with concern for wrist pain.    Pain in right wrist starting Friday. Taped it up and played gold Saturday. Now significant pain. All hand is swollen. Using ice, ACE wrap. Nothing helping now.   No fever, chills, or sweats.  No other direct trauma.    Seen for the same 03/22/21, prednisone worked in a day and a half, 2 days.  Feels very similar.    Medications:     Current Outpatient Medications on File Prior to Visit   Medication Sig Dispense Refill    MAGNESIUM PO magnesium      predniSONE (DELTASONE) 20 mg tablet Take 60 mg (3 pills) for 3 days, 40 mg (2 pills) daily for 3 days, 20 mg (1 pill) daily for 2 days, and 10 mg (half pill) daily for 4 days. 19 tablet 0    loperamide (IMODIUM) 2 MG capsule Take 2 mg by mouth daily      atorvastatin (LIPITOR) 20 MG tablet       Multiple Vitamin (MULTIVITAMIN) per tablet Take 1 tablet by mouth daily       No current facility-administered medications on file prior to visit.       Medications were reviewed and reconciled today.   Allergies:     Allergies   Allergen Reactions    Food Anaphylaxis     Bananas         Review of Systems:     Pertinent positives and negatives as per HPI.     Physical Exam:     Vitals:    05/07/21 1009   BP: 130/84   BP Location: Right arm   Patient Position: Sitting   Pulse: 57   Temp: 36.1 C (96.9 F)   TempSrc: Temporal   SpO2: 99%   Weight: 116.6 kg (257 lb 1.6 oz)   Height: 1.829 m (6')     Wt Readings from Last 3 Encounters:   05/07/21 116.6 kg (257 lb 1.6 oz)   03/22/21 117 kg (258 lb)   01/22/21 114.8 kg (253 lb)     BP Readings from Last 3 Encounters:   05/07/21 130/84   03/22/21 146/74   01/22/21 (!) 130/96       General: In  no apparent distress.  Alert, oriented, very pleasant.  Nontoxic-appearing.  MSK: Right wrist with somewhat diffuse edema, mildly tender to palpation throughout.  Faint erythema, slight warmth.  No tracking.  No areas of fluctuance or induration.  Decreased active range of motion of the wrist secondary to pain.    Assessment and Plan:     Maxwell Wells was seen today for wrist pain.  Exam and symptoms are consistent with recurrent gout flare, and I have restarted prednisone taper which was effective last time.  Additionally, uric acid level was added on and he will pursue this when he next has blood drawn (while asymptomatic).  Encouraged to contact the office if symptoms do not improve or if he develops any concerns for infection such as spreading of redness or fever.    Diagnoses and all orders for this visit:  Gout, unspecified cause, unspecified chronicity, unspecified site  -     predniSONE (DELTASONE) 20 mg tablet; Take 60 mg (3 pills) for 3 days, 40 mg (2 pills) daily for 3 days, 20 mg (1 pill) daily for 2 days, and 10 mg (half pill) daily for 4 days.  -     Uric acid; Future    Immunization due  -     Flu quad high dose >65 YO (high dose)        Follow up as needed.     Pryor Curia, MD 05/07/2021 10:15 AM

## 2021-10-03 IMAGING — MR MRI BRAIN WITH  IAC W/WO CONTRAST
8 of 16 series · 19 of 48 positions shown · IV contrast (gadavist)
Comparison: MRI brain from June 15, 2020.

________________________________________________________________________________________________ 
MRI BRAIN WITH  IAC W/WO CONTRAST,10/03/2021 [DATE]: 
CLINICAL INDICATION: Dizziness. Left-sided hearing loss.
TECHNIQUE: MRI brain without and with contrast utilizing internal auditory canal 
protocol. 11 mL of Gadavist were injected intravenously by hand. 4 mL of 
Gadavist were discarded. Patient was scanned on a 1.5T magnet.

[Series 102: mpr - smartbrain · axial · 1.1mm · 1.09mm/px · z∈[+0,+174]mm · 2 of 2 slices shown]
[im 1/2]
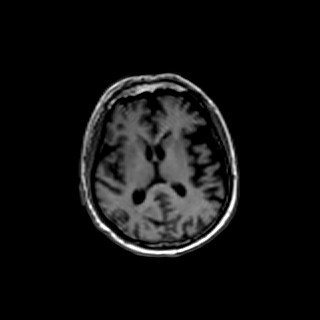
[im 2/2]
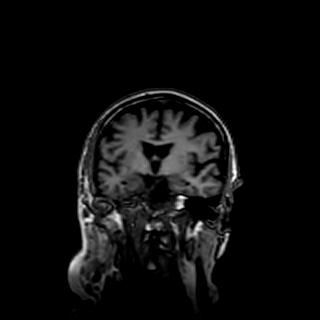

[Series 201: t1_se_sag · sagittal · 4.0mm · 0.43mm/px · 1 of 28 slices shown]
[im 1/28]
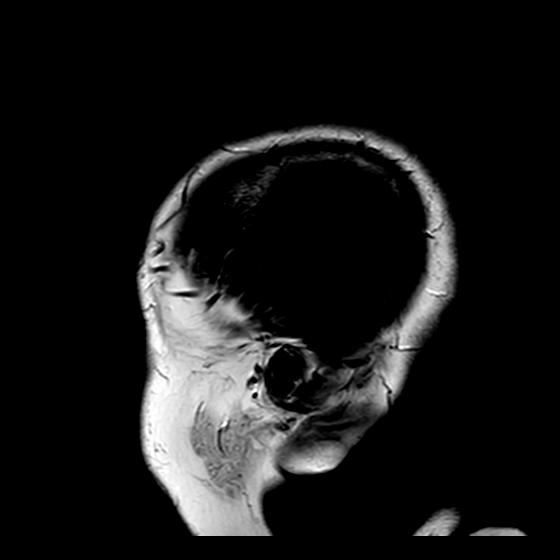

[Series 301: flair_ax · axial · 5.0mm · 0.48mm/px · 1 of 27 slices shown]
[im 1/27]
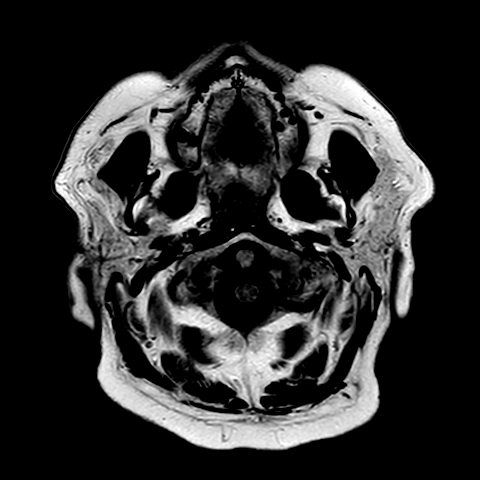

[Series 403: dadc map · axial · 5.0mm · 1.03mm/px · 1 of 27 slices shown]
[im 1/27]
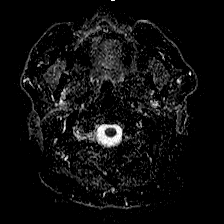

[Series 404: (id) · axial · 5.0mm · 1.03mm/px · 1 of 27 slices shown]
[im 1/27]
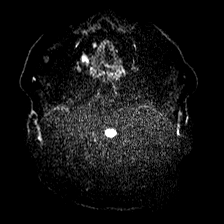

[Series 501: t1w_se_ax · axial · 5.0mm · 0.43mm/px · 1 of 27 slices shown]
[im 1/27]
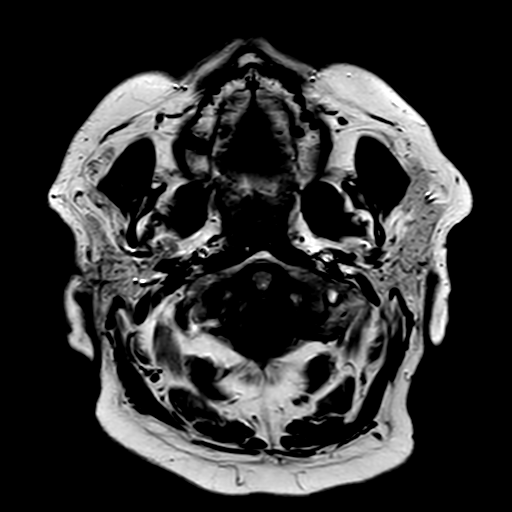

[Series 601: SWI · axial · 3.0mm · 0.40mm/px · z∈[-63,+84]mm · 8 of 200 slices shown]
[im 1/200]
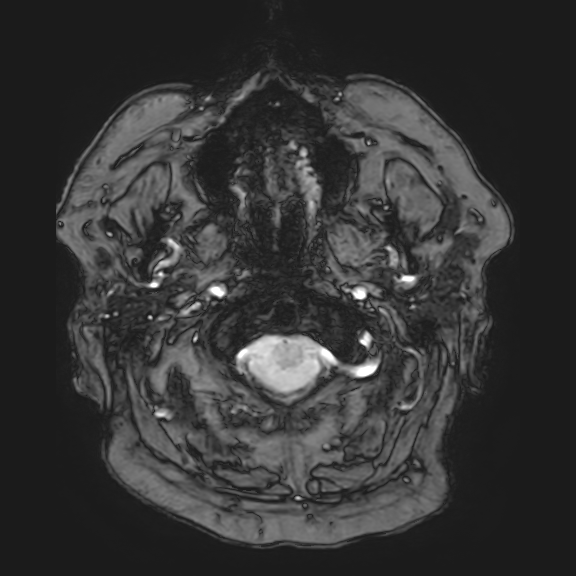
[im 23/200]
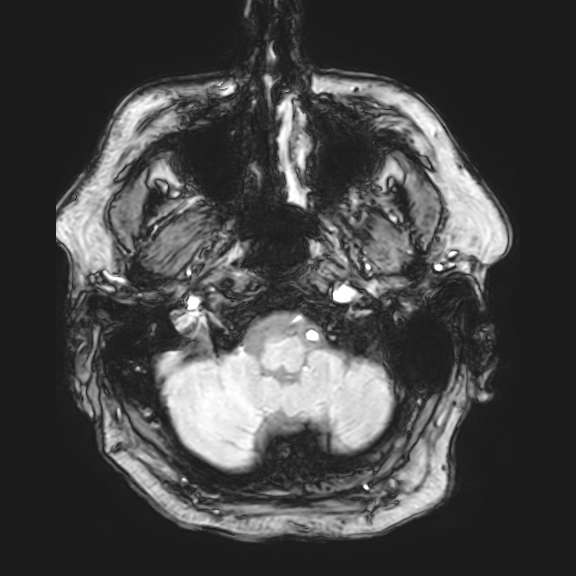
[im 67/200]
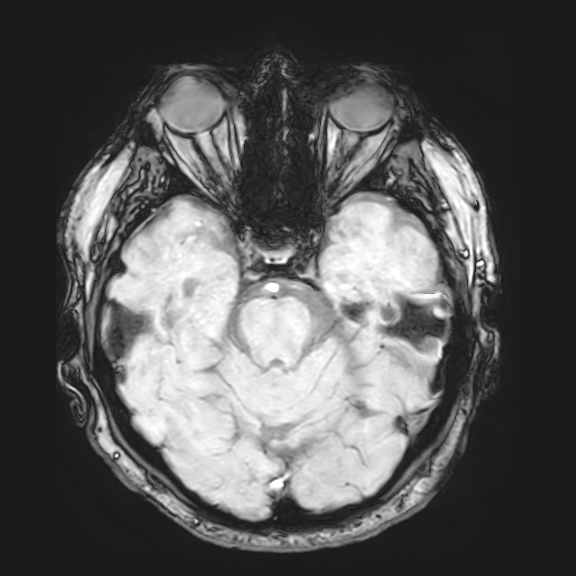
[im 89/200]
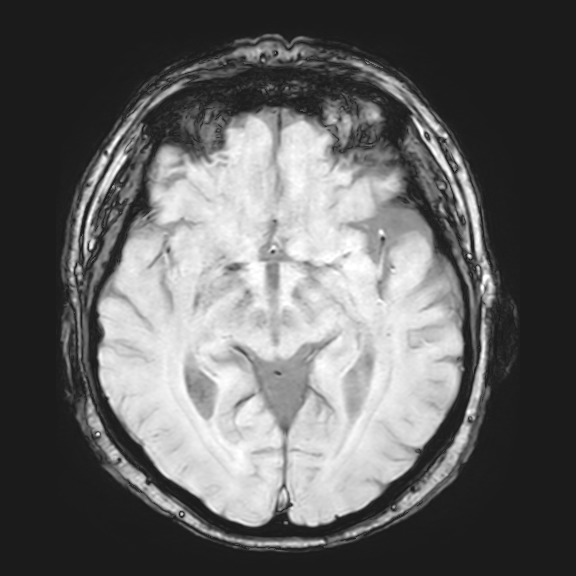
[im 111/200]
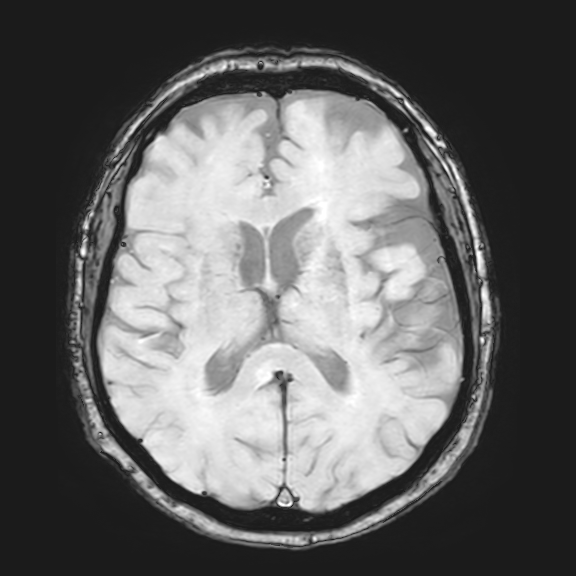
[im 133/200]
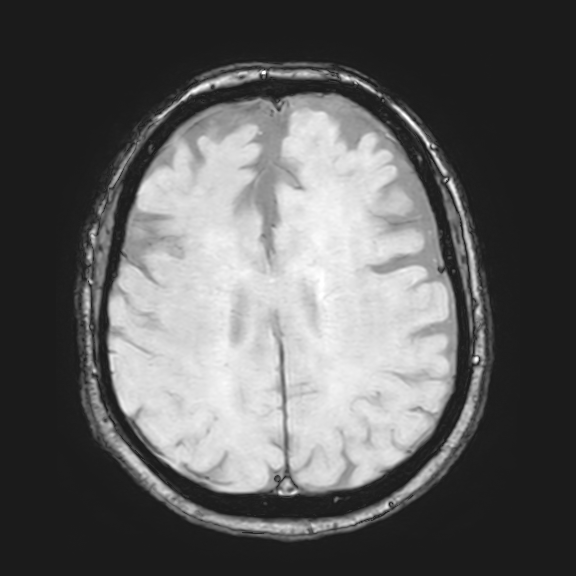
[im 177/200]
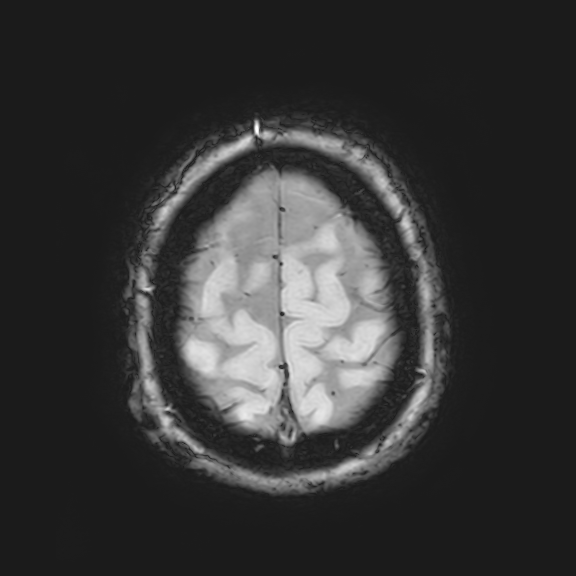
[im 200/200]
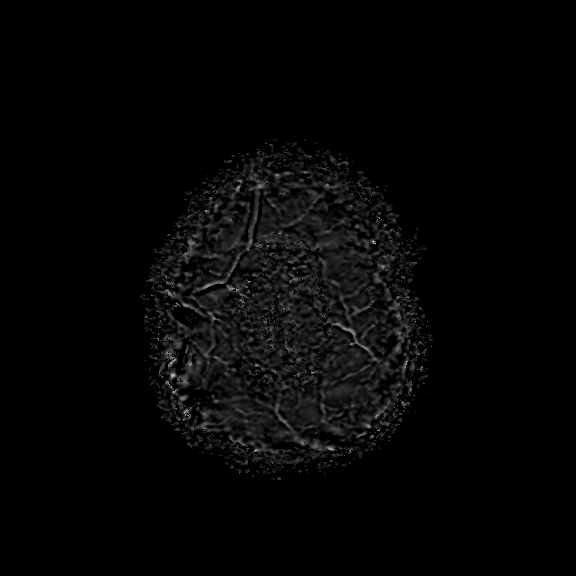

[Series 602: swip · axial · 10.0mm · 0.36mm/px · z∈[-55,+13]mm · 4 of 140 slices shown]
[im 1/140]
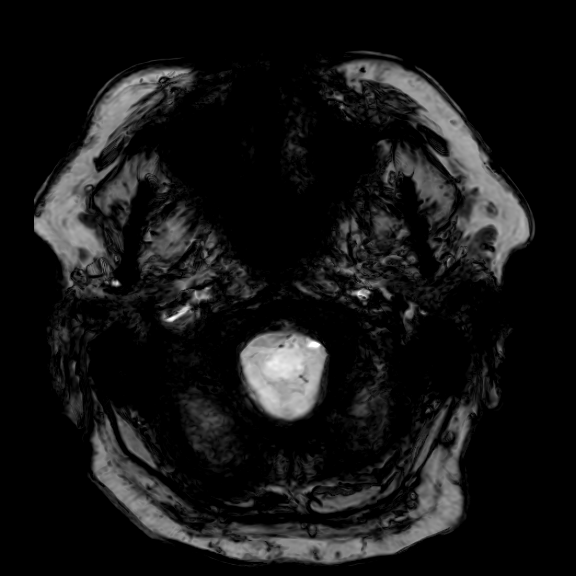
[im 24/140]
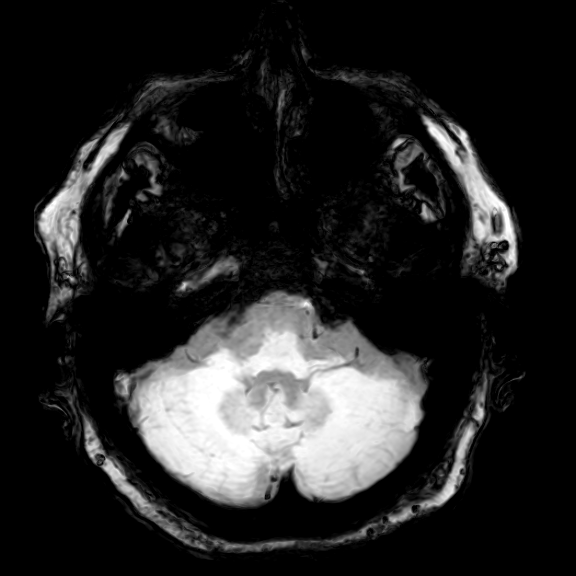
[im 47/140]
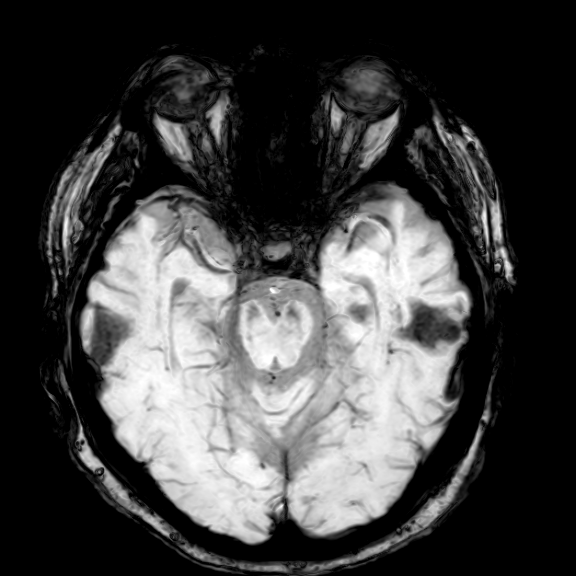
[im 70/140]
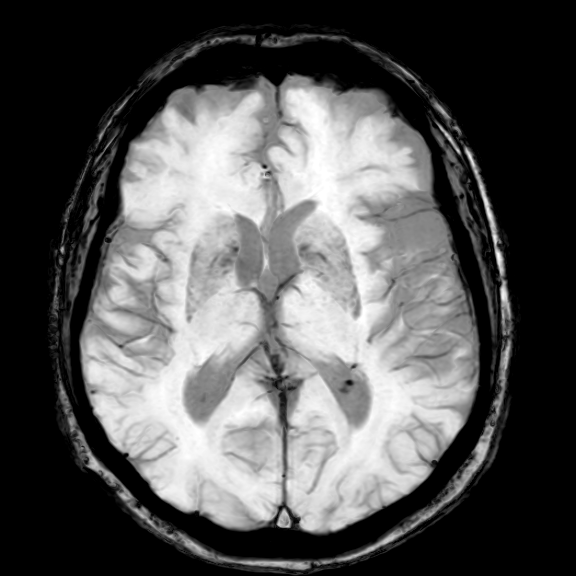

[19 of 48 positions shown; findings below may reference images not displayed]

FINDINGS: --------------------------------------------------------------------------- 
Intracranial: 
Previously present abnormal signal along the right side of the splenium corpus 
callosum has improved. There is continued mild T2 prolongation in this location. 
Periventricular and deep white matter change, probably secondary to 
microangiopathy.  No acute ischemia. No abnormal foci of susceptibility artifact 
in the brain. Patency of the major intracranial vascular flow voids. No 
pathologic enhancement in the brain. No acute intracranial hemorrhage, mass 
effect, midline shift.  
--------------------------------------------------------------------------- 
IACs: 
Imaging of the internal auditory canals reveal no evidence of mass or pathologic 
enhancement on either side. Mild fluid noted in the right mastoid air cells, 
chronic and stable. 
--------------------------------------------------------------------------- 
Extracranial: 
Visualized parapharyngeal soft tissues and orbits show no focal abnormality. 
---------------------------------------------------------------------------
IMPRESSION: 1.  No acute abnormality, mass, pathologic enhancement in the brain or internal 
auditory canals. 
2.  There has been interval improvement in previously noted abnormal signal 
along the right side of the corpus callosum splenium. This may therefore 
represent sequelae of focal prior ischemic change given the lack of progression 
with the residual T2 prolongation representing gliotic change.

## 2021-11-16 ENCOUNTER — Encounter: Payer: Self-pay | Admitting: Primary Care

## 2021-11-16 DIAGNOSIS — M1120 Other chondrocalcinosis, unspecified site: Secondary | ICD-10-CM

## 2021-12-17 ENCOUNTER — Encounter: Payer: Self-pay | Admitting: Gastroenterology

## 2022-01-29 ENCOUNTER — Other Ambulatory Visit
Admission: RE | Admit: 2022-01-29 | Discharge: 2022-01-29 | Disposition: A | Discharge status: Home / Self Care | Payer: Medicare (Managed Care) | Source: Ambulatory Visit | Attending: Rheumatology | Admitting: Rheumatology

## 2022-01-29 ENCOUNTER — Telehealth: Payer: Self-pay | Admitting: Rheumatology

## 2022-01-29 ENCOUNTER — Other Ambulatory Visit: Payer: Self-pay

## 2022-01-29 ENCOUNTER — Encounter: Payer: Self-pay | Admitting: Rheumatology

## 2022-01-29 ENCOUNTER — Ambulatory Visit: Payer: Medicare (Managed Care) | Admitting: Rheumatology

## 2022-01-29 VITALS — BP 147/85 | HR 89 | Temp 97.5°F | Ht 72.0 in | Wt 251.0 lb

## 2022-01-29 DIAGNOSIS — M2550 Pain in unspecified joint: Secondary | ICD-10-CM

## 2022-01-29 LAB — BASIC METABOLIC PANEL
Anion Gap: 11 (ref 7–16)
CO2: 25 mmol/L (ref 20–28)
Calcium: 8.9 mg/dL (ref 8.6–10.2)
Chloride: 104 mmol/L (ref 96–108)
Creatinine: 0.87 mg/dL (ref 0.67–1.17)
Glucose: 102 mg/dL — ABNORMAL HIGH (ref 60–99)
Lab: 15 mg/dL (ref 6–20)
Potassium: 4.8 mmol/L (ref 3.3–5.1)
Sodium: 140 mmol/L (ref 133–145)
eGFR BY CREAT: 90 *

## 2022-01-29 LAB — CBC AND DIFFERENTIAL
Baso # K/uL: 0.1 10*3/uL (ref 0.0–0.1)
Basophil %: 0.7 %
Eos # K/uL: 0.2 10*3/uL (ref 0.0–0.5)
Eosinophil %: 1.7 %
Hematocrit: 46 % (ref 40–51)
Hemoglobin: 15.1 g/dL (ref 13.7–17.5)
IMM Granulocytes #: 0.1 10*3/uL — ABNORMAL HIGH (ref 0.0–0.0)
IMM Granulocytes: 0.9 %
Lymph # K/uL: 1.3 10*3/uL (ref 1.3–3.6)
Lymphocyte %: 14.3 %
MCH: 32 pg (ref 26–32)
MCHC: 33 g/dL (ref 32–37)
MCV: 98 fL — ABNORMAL HIGH (ref 79–92)
Mono # K/uL: 1.1 10*3/uL — ABNORMAL HIGH (ref 0.3–0.8)
Monocyte %: 12 %
Neut # K/uL: 6.5 10*3/uL — ABNORMAL HIGH (ref 1.8–5.4)
Nucl RBC # K/uL: 0 10*3/uL (ref 0.0–0.0)
Nucl RBC %: 0 /100 WBC (ref 0.0–0.2)
Platelets: 301 10*3/uL (ref 150–330)
RBC: 4.7 MIL/uL (ref 4.6–6.1)
RDW: 13.8 % (ref 11.6–14.4)
Seg Neut %: 70.4 %
WBC: 9.2 10*3/uL — ABNORMAL HIGH (ref 4.2–9.1)

## 2022-01-29 LAB — HEPATIC FUNCTION PANEL
ALT: 27 U/L (ref 0–50)
AST: 20 U/L (ref 0–50)
Albumin: 4 g/dL (ref 3.5–5.2)
Alk Phos: 76 U/L (ref 40–130)
Bili,Indirect: 1 mg/dL (ref 0.1–1.0)
Bilirubin,Direct: 0.3 mg/dL (ref 0.0–0.3)
Bilirubin,Total: 1.3 mg/dL — ABNORMAL HIGH (ref 0.0–1.2)
Total Protein: 6.6 g/dL (ref 6.3–7.7)

## 2022-01-29 LAB — SEDIMENTATION RATE, AUTOMATED: Sedimentation Rate: 18 mm/hr (ref 0–20)

## 2022-01-29 LAB — CRP: CRP: 58 mg/L — ABNORMAL HIGH (ref 0–8)

## 2022-01-29 LAB — URIC ACID: Urate: 6.6 mg/dL (ref 3.9–9.0)

## 2022-01-29 NOTE — Telephone Encounter (Signed)
Called and spoke with [place]. Dr. Venetia Constable would like to see him back in the office in 4 weeks for a fuv. Scheduled pt's next fuv with Dr Venetia Constable on Friday, July 21st at 12:00, noon. Pt is agreeable to date and time with no further questions or concerns.

## 2022-01-29 NOTE — Progress Notes (Signed)
Rheumatology New Consult Note    PRIMARY CARE PHYSICIAN:  Dobrzynski, Teena Irani, MD  REFERRING PHYSICIAN:  Sherrin Daisy Teena Irani, MD    CHIEF COMPLAINT:  Joint pain    HPI:   Maxwell Wells is a 76 y.o. year old Caucasian male who is referred for evaluation of joint pain, rule out pseudogout. Has PMH of Gout, renal cell carcinoma, prostate cancer, and Afib, OA of the knees. Was diagnosed with gout in the 1990's, presenting as bilateral podagra. Reports having aspirated the affected joints but does not recall the results. Endorses previously elevated uric acid level. Last result noted was 7.5 in April 2023. Took Allopurinol for many years and reports having discontinued the medication due to ?liver injury from alcohol use disorder. No gout treatment for > 5 years without recurrence of flares. Denies having other joint involvement in the past. Last September, he experienced sudden right wrist pain (denies swelling), pain severity 10/10. Went to urgent care, provided with NSAIDS which did not help. Subsequently, had gone to [place] for the winter, PCP there thought it was pseudogout and was prescribed prednisone 20 mg taper, completely resolved his right wrist pain. A week later relapse after finishing steroids. Has been on courses of steroids tapers since then. Not on oral steroids now, but saw Orthopedics two weeks ago who provided him with a cortisone injection of right wrist which resolved his symptoms again. Patient no longer consuming alcohol. Does not adhere to a strict gout-friendly diet. Rest of ROS as below:      PAST MEDICAL HISTORY:  Past Medical History:   Diagnosis Date   . AF (atrial fibrillation)    . Prostate cancer 10/18/2010   . Renal cell cancer 10/18/2010      PAST SURGICAL HISTORY:  Past Surgical History:   Procedure Laterality Date   . PARTIAL NEPHRECTOMY     . PROSTATE SURGERY        ALLERGIES:  Allergies   Allergen Reactions   . Food Anaphylaxis     Bananas       CURRENT MEDICATIONS:  Outpatient  Encounter Medications as of 01/29/2022   Medication Sig Dispense Refill   . loperamide (IMODIUM) 2 MG capsule Take 1 capsule (2 mg total) by mouth daily     . atorvastatin (LIPITOR) 20 MG tablet      . [DISCONTINUED] triamcinolone acetonide (KENALOG) 40 MG/ML injection 1 mL (40 mg total)     . [DISCONTINUED] predniSONE (DELTASONE) 20 mg tablet Take 60 mg (3 pills) for 3 days, 40 mg (2 pills) daily for 3 days, 20 mg (1 pill) daily for 2 days, and 10 mg (half pill) daily for 4 days. (Patient not taking: Reported on 01/29/2022) 19 tablet 0   . [DISCONTINUED] MAGNESIUM PO magnesium (Patient not taking: Reported on 01/29/2022)     . [DISCONTINUED] Multiple Vitamin (MULTIVITAMIN) per tablet Take 1 tablet by mouth daily (Patient not taking: Reported on 01/29/2022)       No facility-administered encounter medications on file as of 01/29/2022.     FAMILY HISTORY:  Family History   Problem Relation Age of Onset   . High Blood Pressure Mother    . Heart Disease Father    . High cholesterol Father    . High Blood Pressure Father      SOCIAL HISTORY:  Social History     Socioeconomic History   . Marital status: Married   Tobacco Use   . Smoking status: Former  Packs/day: 0.50     Years: 15.00     Pack years: 7.50     Types: Cigarettes   . Smokeless tobacco: Never   Substance and Sexual Activity   . Alcohol use: Yes     Comment: social   . Drug use: Never        REVIEW of SYSTEMS:    Constitutional: No unexplained fever, no weight loss and no fatigue.    Musculoskeletal: Joint pain. No joint swelling and no joint stiffness.    Skin: No rash.    Eyes: No visual change.    ENT: No mouth sores and no nasal sores or ulcers.    Respiratory: No shortness of breath.    Cardiac: No chest pain and fingers/toes do not turn blue.    Gastrointestinal: No abdominal pain.    Genitourinary: No dysuria and no hematuria.    Endocrine:    Allergic/Immunologic: Food allergies.    Hematologic: Negative.   Neurologic: No sensory change.    Psychiatric:  No difficulty falling asleep and no difficulty staying asleep.    Habits: No tobacco use  No alcohol use         PHYSICAL EXAMINATION:  BP 147/85   Pulse 89   Temp 36.4 C (97.5 F) (Temporal)   Ht 1.829 m (6')   Wt 113.9 kg (251 lb)   BMI 34.04 kg/m    Pain    01/29/22 0909   PainSc:   0 - No pain      Constitutional   Well-developed.     Normal body habitus.    Eyes   Conjunctivae are normal.     HENT   There are no oral lesions.    Cardiovascular   Normal rate.    Pulmonary   Effort is normal and breath sounds are normal.    Skin   Skin is warm and dry.     He has no erythema and no rash.    Extremities   There is no edema of the extremities.     Musculoskeletal Exam:     Upper and lower extremities There is peripheral joint tenderness. There is no peripheral joint swelling, no peripheral joint deformity, no abnormal or restricted ROM and no pain with ROM.                 PRIOR STUDIES:   Labs: - Recent labs reviewed -- please see ERecord results review section for values.     Radiology:   -Recent imaging reports reviewed -- please see ERecord imaging section for details.    IMPRESSION:    Maxwell Wells is a 76 y.o. year old Caucasian male who is referred for evaluation of joint pain. At this time, patient's presentation can be suggestive of crystalline arthritis vs. Inflammatory arthritis. More suggestive of crystalline arthropathy such as gout or pseudogout given the sudden onset of monoarticular involvement with previous diagnosis of gout and hyperuricemia, without being on allopurinol for many years. Unclear why his bilateral PIPs were tender today, would be reasonable to get repeat blood work including RF and CCP serologies. Will also obtain U/S of right hand and wrist and try to obtain X-ray results that were not available via [facility]. In the interim, no acute intervention given no symptoms on exam today secondary to recent cortisone injection by Orthopedics. Follow up with next  available.        RECOMMENDATIONS:   1. Joint pain  Uric acid  Basic metabolic panel    CBC and differential    Rheumatoid factor,screen    Cyclic citrullinated peptide    Sedimentation rate, automated    CRP    Hepatic function panel              Polly Cobia, DO  Rheumatology Fellow, PGY-5  Division of Allergy/Immunology & Rheumatology  Department of Medicine  [facility]

## 2022-01-30 LAB — CYCLIC CITRULLINATED PEPTIDE: Cyclic Citrullin Peptide Ab: 0.6 U/mL (ref 0.0–2.9)

## 2022-01-30 LAB — RHEUMATOID FACTOR,SCREEN: Rheumatoid Factor: <8 IU/mL

## 2022-01-31 ENCOUNTER — Encounter: Payer: Self-pay | Admitting: Rheumatology

## 2022-02-01 NOTE — Telephone Encounter (Signed)
Pt has been scheduled for right hand/ wrist ultrasound for 02/18/22 at 1030am. Pt is all set. His fuv is also for 03/01/22 at 12pm.

## 2022-02-06 ENCOUNTER — Telehealth: Payer: Self-pay | Admitting: Rheumatology

## 2022-02-06 NOTE — Telephone Encounter (Unsigned)
Copied from CRM [id number]. Topic: Access to Care - Speak to Provider/Office Staff  >> Feb 06, 2022 10:07 AM Rulon Abide wrote:  Thomes is calling this morning to speak with a nurse. He states he has an Ultrasound on 7/10 for his wrist but he is feeling like his wrist is starting to flare. He states the pain started yesterday and is getting increasingly worse. He states when it usually flares it will start slow but by day three he is in excruciating pain.     He would like to know if he can have a Prednisone prescription sent in to  ease his pain until the ultrasound. He would like it sent to the [facility] [address], [place] - [address]  He states if that is not possible he would like to know if he can move up his ultrasound and Provider appointments.     Fares states he had a cortisone shot in his wrist about a month ago, that did help with his pain but it has already wore off. He says he believes he will not be able to get another Cortisone shot for at least another 2 months.     Jerami would like a call back at [phone] to be advised on how to deal with his pain whether it is with medication or be seen by his provider sooner.

## 2022-02-07 MED ORDER — PREDNISONE 5 MG PO TABS *I*
20.0000 mg | ORAL_TABLET | Freq: Every day | ORAL | 0 refills | Status: AC
Start: 2022-02-07 — End: 2022-02-14

## 2022-02-07 NOTE — Telephone Encounter (Signed)
Copied from CRM [id number]. Topic: Medications/Prescriptions - Prescription Status/Information  >> Feb 07, 2022  9:03 AM [name] wrote:  Maxwell Wells, Patient, is calling to check the status of their request for Prednisone. (refer to previous encounter)    They are requesting a return call at [phone].

## 2022-02-07 NOTE — Telephone Encounter (Signed)
Can we call and advise prednisone 20mg  daliy for next 7 days     Would be ideal if he could wait to start this until after the ultrasound to start the prednisone     Taking prednisone right before the scan will limit yield on the scan     Thanks  [name], MD , Rheumatology Attending, 02/07/2022

## 2022-02-07 NOTE — Telephone Encounter (Signed)
Called patient and relayed Dr. Palma's message.  Patient verbalized understanding and agreeable to plan.

## 2022-02-18 ENCOUNTER — Other Ambulatory Visit: Payer: Self-pay

## 2022-02-18 ENCOUNTER — Ambulatory Visit: Payer: Medicare (Managed Care)

## 2022-02-18 DIAGNOSIS — G8929 Other chronic pain: Secondary | ICD-10-CM

## 2022-02-18 DIAGNOSIS — M79641 Pain in right hand: Secondary | ICD-10-CM

## 2022-02-18 DIAGNOSIS — M7989 Other specified soft tissue disorders: Secondary | ICD-10-CM

## 2022-02-20 NOTE — Procedures (Signed)
Musculoskeletal ultrasound evaluation of the right hand and wrist.    Indication: History of pain and swelling.  Evaluate for synovitis or tenosynovitis.    Studies are performed on a GE Logiq E10 unit.  High-frequency ultrasonography at 15 MHz is used.  Gray scale and power Doppler studies are obtained.  Tendons, ligaments, nerve, joint capsule, tendon sheath and bony structures are visualized.    Findings: The wrist is visualized from a dorsal, volar, radial and ulnar aspect.  MCP joints 2 through 5 are visualized from a dorsal aspect.  PIP joints 2 through 5 are visualized from a volar aspect.    No increased fluid collection is seen in the dorsal recesses of radial carpal and midcarpal joints.  No increased fluid collection or synovial proliferation is seen in the examined MCP joints.  No increased fluid collection or synovial proliferation seen in PIP joints.  No bony erosions are seen.  No increased power Doppler signal seen in any of the examined joint areas.    Impression: No inflammatory changes of the wrist and finger joints by ultrasound criteria.

## 2022-03-01 ENCOUNTER — Encounter: Payer: Self-pay | Admitting: Rheumatology

## 2022-03-01 ENCOUNTER — Other Ambulatory Visit: Payer: Self-pay

## 2022-03-01 ENCOUNTER — Telehealth: Payer: Self-pay

## 2022-03-01 ENCOUNTER — Ambulatory Visit: Payer: Medicare (Managed Care) | Admitting: Rheumatology

## 2022-03-01 VITALS — BP 147/84 | HR 101 | Temp 97.6°F | Ht 76.0 in | Wt 253.0 lb

## 2022-03-01 DIAGNOSIS — M7989 Other specified soft tissue disorders: Secondary | ICD-10-CM

## 2022-03-01 DIAGNOSIS — R7982 Elevated C-reactive protein (CRP): Secondary | ICD-10-CM

## 2022-03-01 DIAGNOSIS — G8929 Other chronic pain: Secondary | ICD-10-CM

## 2022-03-01 DIAGNOSIS — M79641 Pain in right hand: Secondary | ICD-10-CM

## 2022-03-01 MED ORDER — METHYLPREDNISOLONE 4 MG PO TBPK *A*
ORAL_TABLET | ORAL | 0 refills | Status: DC
Start: 2022-03-01 — End: 2022-08-07

## 2022-03-01 NOTE — Progress Notes (Signed)
Rheumatology Follow-Up     PRIMARY CARE PHYSICIAN: Otilio Miu, MD    VISIT DIAGNOSIS:    1. Chronic pain of right hand    2. Swelling of right hand         HPI:   Maxwell Wells is a 76 y.o. year old male who is here for follow-up of   1. Chronic pain of right hand    2. Swelling of right hand      We are seeing him for episodes of pain, swelling and stiffness particularly in the right hand.  He carries a history of gout, history of renal cell carcinoma, prostate cancer and atrial fibrillation.  History of osteoarthritis of the knees.  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  In the past, elevated uric acid levels.  Has in the past used allopurinol, reports question of liver injury, not on this medication currently.  Has had sudden onset of right wrist pain in September of last year.  Has had several episodes, treated with injections or glucocorticoids.  Had a recent flareup and responded to 20 mg of prednisone.  We had obtained a musculoskeletal ultrasound study of the right hand and wrist that was negative.  Recent serum urate level 6.6 mg/dL.  Has had a radiograph with his orthopedist, results not available yet.      PAST MEDICAL HISTORY:   Past Medical History:   Diagnosis Date   . AF (atrial fibrillation)    . Prostate cancer 10/18/2010   . Renal cell cancer 10/18/2010      Past Surgical History:   Procedure Laterality Date   . PARTIAL NEPHRECTOMY     . PROSTATE SURGERY          ALLERGIES: Food    CURRENT MEDICATIONS:   Outpatient Encounter Medications as of 03/01/2022   Medication Sig Dispense Refill   . loperamide (IMODIUM) 2 MG capsule Take 1 capsule (2 mg total) by mouth daily     . atorvastatin (LIPITOR) 20 MG tablet      . methylPREDNISolone (MEDROL PAK) 4 MG tablet pack Take according to package directions (6 day supply) 21 tablet 0     No facility-administered encounter medications on file as of 03/01/2022.         FAMILY HISTORY:  Family History   Problem Relation Age of Onset   . High  Blood Pressure Mother    . Heart Disease Father    . High cholesterol Father    . High Blood Pressure Father        SOCIAL HISTORY:  Social History     Socioeconomic History   . Marital status: Married   Tobacco Use   . Smoking status: Former     Packs/day: 0.50     Years: 15.00     Pack years: 7.50     Types: Cigarettes   . Smokeless tobacco: Never   Substance and Sexual Activity   . Alcohol use: Yes     Comment: social   . Drug use: Never         ROS :   Episodes of right hand and wrist pain and swelling.    PHYSICAL EXAMINATION:  BP 147/84   Pulse 101   Temp 36.4 C (97.6 F) (Temporal)   Ht 1.93 m (6\' 4" )   Wt 114.8 kg (253 lb)   BMI 30.80 kg/m   Body mass index is 30.8 kg/m.  Well-developed, well-nourished patient in no acute distress.  appreciated.  No enlarged cervical, supraclavicular, infraclavicular or axillary lymph nodes are palpable.  Chest is clear to auscultation.  Heart S1, S2.  Extremity examination shows no synovitis in the DIP joints, PIP joints, MCP joints or wrists.  Elbows and shoulders without palpable fluid collection.  Knees have no palpable effusion.  Ankles have no edema.  Compression of the MTP joints is not tender.  Skin examination without rash.    PRIOR STUDIES:    Labs:   - Recent labs reviewed -- please see ERecord results review section for values.  - All labs, last 3 months    Hospital Outpatient Visit on 01/29/2022   Component Date Value   . Urate 01/29/2022 6.6    . Glucose 01/29/2022 102 (H)    . Sodium 01/29/2022 140    . Potassium 01/29/2022 4.8    . Chloride 01/29/2022 104    . CO2 01/29/2022 25    . Anion Gap 01/29/2022 11    . UN 01/29/2022 15    . Creatinine 01/29/2022 0.87    . eGFR BY CREAT 01/29/2022 90    . Calcium 01/29/2022 8.9    . WBC 01/29/2022 9.2 (H)    . RBC 01/29/2022 4.7    . Hemoglobin 01/29/2022 15.1    . Hematocrit 01/29/2022 46    . MCV 01/29/2022 98 (H)    . [facility] 01/29/2022 32    . MCHC 01/29/2022 33    . RDW 01/29/2022 13.8    . Platelets  01/29/2022 301    . Seg Neut % 01/29/2022 70.4    . Lymphocyte % 01/29/2022 14.3    . Monocyte % 01/29/2022 12.0    . Eosinophil % 01/29/2022 1.7    . Basophil % 01/29/2022 0.7    . Neut # K/uL 01/29/2022 6.5 (H)    . Lymph # K/uL 01/29/2022 1.3    . Mono # K/uL 01/29/2022 1.1 (H)    . Eos # K/uL 01/29/2022 0.2    . Baso # K/uL 01/29/2022 0.1    . Nucl RBC % 01/29/2022 0.0    . Nucl RBC # K/uL 01/29/2022 0.0    . IMM Granulocytes # 01/29/2022 0.1 (H)    . IMM Granulocytes 01/29/2022 0.9    . Rheumatoid Factor 01/29/2022 <8    . Cyclic Citrullin Peptide* 01/29/2022 0.6    . Sedimentation Rate 01/29/2022 18    . Total Protein 01/29/2022 6.6    . Albumin 01/29/2022 4.0    . Bilirubin,Total 01/29/2022 1.3 (H)    . Bili,Indirect 01/29/2022 1.0    . Bilirubin,Direct 01/29/2022 0.3    . Alk Phos 01/29/2022 76    . AST 01/29/2022 20    . ALT 01/29/2022 27    . CRP 01/29/2022 58 (H)      Radiology:   -Recent images reviewed personally.  MSK ultrasound studies were reviewed with the patient, no tophi noted.    IMPRESSION:  History of recurrent wrist swelling, prior history of gout.  The diagnosis remains unclear, gout would remain a possibility, cannot rule out CPPD disease as well.  We have asked his orthopedist about his radiographs so that I can review them.  He will follow-up in 4 to 6 months, or sooner if needed.  I have provided him with a standby prescription for Medrol Dosepak.      RECOMMENDATIONS:   1. Chronic pain of right hand        2. Swelling of right hand

## 2022-03-01 NOTE — Telephone Encounter (Signed)
Called office of [employer] [facility]. Wyline Copas, DO  Family Medicine, Orthopaedics, Sports Medicine  At Phone: [phone]  And their office was closed.    I was calling to get copies of patients xrays for Dr.Thiele.... The office was closed. (maybe for lunch?)    May you please try to get these from their office? Thank you.

## 2022-03-07 ENCOUNTER — Encounter: Payer: Self-pay | Admitting: Gastroenterology

## 2022-03-11 ENCOUNTER — Telehealth: Payer: Self-pay | Admitting: Primary Care

## 2022-03-11 NOTE — Telephone Encounter (Signed)
Pt stated he will continue to treat symptoms and call if they get worse.

## 2022-03-11 NOTE — Telephone Encounter (Signed)
URI/Cough        1. Significant trouble breathing (I.e. can not talk in full sentences)?  no        Additional information:  2. When did your symptoms start? 7/26   3. Have you taken a COVID test? No  What date?  4. Wheezing?no  5. Temperature greater than 100.4 F?no  6. Nasal congestion? no  7. Sinus pressure?no  8. Sinus drainage? no  1. If yes, what color?  9. Sore throat ? no   10. Difficulty swallowing? no  11. Nausea? no  12. Vomiting? no  13. Diarrhea? no  14.  If patient was positive for COVID, is the patient requesting an antiviral medication? no    Cough,     Pt has had pneuma a few months ago and is concerned and wants to make sure it has not returned. Please advise.

## 2022-03-11 NOTE — Telephone Encounter (Signed)
Attempted to reach patient for message from Dr Timmothy Sours that symptoms sounds viral. Would continue to treat symptoms or I can see him tomorrow afternoon   Left message to call office back

## 2022-03-11 NOTE — Telephone Encounter (Signed)
Reports dry cough every couple hour, started 7/26. Using cough drops. Patient concerned had  pneumonia this past spring. States has no other symptoms   No chest pain, no sob, no fever, no chills, appetite good.  Patient wants to know what provider recommends

## 2022-03-15 NOTE — Telephone Encounter (Signed)
Spoke to Dr Fulton Mole office   Will be faxing over X-ray report.  Provided name and fax number

## 2022-03-18 ENCOUNTER — Encounter: Payer: Self-pay | Admitting: Primary Care

## 2022-03-18 ENCOUNTER — Other Ambulatory Visit: Payer: Self-pay

## 2022-03-18 ENCOUNTER — Ambulatory Visit: Payer: Medicare (Managed Care) | Admitting: Primary Care

## 2022-03-18 ENCOUNTER — Telehealth: Payer: Self-pay | Admitting: Rheumatology

## 2022-03-18 VITALS — BP 130/84 | HR 88 | Temp 97.2°F | Ht 75.98 in | Wt 259.0 lb

## 2022-03-18 DIAGNOSIS — B9689 Other specified bacterial agents as the cause of diseases classified elsewhere: Secondary | ICD-10-CM

## 2022-03-18 DIAGNOSIS — J019 Acute sinusitis, unspecified: Secondary | ICD-10-CM

## 2022-03-18 MED ORDER — AMOXICILLIN 500 MG PO CAPS *I*
500.0000 mg | ORAL_CAPSULE | Freq: Three times a day (TID) | ORAL | 0 refills | Status: AC
Start: 2022-03-18 — End: 2022-03-25

## 2022-03-18 NOTE — Telephone Encounter (Addendum)
Received notes sent from [facility]. Sent to e-scanning.

## 2022-03-18 NOTE — Progress Notes (Signed)
[facility]   Progress Note    Reason For Visit:   Chief Complaint   Patient presents with   ? URI       Subjective:      Maxwell Wells is 76 y.o. year old male with past medical history that has included prostate cancer status post prostatectomy, atrial fibrillation status post Watchman procedure not on anticoagulation here today with concern for ongoing cough.    3-3.5 weeks of cough (though this is also somewhat chronic).   As day goes on his left face feels more and more full.   Went to [facility] Saturday, was started on sudafed.   Is fine at beginning of day after sleeping on the right, but fills up as day progresses. Maybe same, but seems to change.   Cough has been back to baseline.   No fever, chills, or sweats.  No sore throat.     About 5-10 years ago, had hx of hearing loss on left side, and facial numbness--as result has had issues with sensing pain/earaches on that side.    Medications:     Current Outpatient Medications on File Prior to Visit   Medication Sig Dispense Refill   ? methylPREDNISolone (MEDROL PAK) 4 MG tablet pack Take according to package directions (6 day supply) 21 tablet 0   ? loperamide (IMODIUM) 2 MG capsule Take 1 capsule (2 mg total) by mouth daily     ? atorvastatin (LIPITOR) 20 MG tablet        No current facility-administered medications on file prior to visit.       Medications were reviewed and reconciled today.   Allergies:     Allergies   Allergen Reactions   ? Food Anaphylaxis     Bananas         Review of Systems:     Pertinent positives and negatives as per HPI.     Physical Exam:     Vitals:    03/18/22 1530   BP: 130/84   Pulse: 88   Temp: 36.2 ?C (97.2 ?F)   SpO2: 99%   Weight: 117.5 kg (259 lb)   Height: 1.93 m (6' 3.98")     Wt Readings from Last 3 Encounters:   03/18/22 117.5 kg (259 lb)   03/01/22 114.8 kg (253 lb)   01/29/22 113.9 kg (251 lb)     BP Readings from Last 3 Encounters:   03/18/22 130/84   03/01/22 147/84   01/29/22 147/85       General: In  no apparent distress.  Alert, oriented, very pleasant.  Nontoxic-appearing.  Eyes: Anicteric with normal conjunctiva.  Pupils equally round.  ENMT: Normal pinnae.  Acuity to conversational tones is good.  Bilateral EACs patent, TMs pearly, normal light reflexes.  No tenderness to palpation over bilateral maxillary or frontal sinuses.  Lymph: No cervical or supraclavicular lymphadenopathy.  Pulmonary: Clear to auscultation bilaterally with good air movement. No wheezes, rhonchi, or rales.  Respirations unlabored.  Cardiovascular:  Regular rate and rhythm. No murmurs, rubs, or gallops.    Assessment and Plan:     Maxwell Wells was seen today for uri.  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  Exam today is somewhat limited by known prior neuropathy, but symptoms over the course of his illness have related to ongoing pressure/discomfort in the maxillary  and frontal face concerning for possible sinusitis.  Given duration of symptoms we will treat empirically for acute bacterial sinusitis with amoxicillin.  He will keep [place] updated with any lack of improvement or other new concerns over the coming week.    Diagnoses and all orders for this visit:    Acute bacterial sinusitis    Other orders  -     amoxicillin (AMOXIL) 500 mg capsule; Take 1 capsule (500 mg total) by mouth every 8 hours for 7 days  for Acute Bacterial Infection of the Sinuses        Follow up as needed.     Ellamae Sia, MD 03/18/2022 3:39 PM

## 2022-03-28 ENCOUNTER — Other Ambulatory Visit: Payer: Self-pay | Admitting: Gastroenterology

## 2022-03-28 ENCOUNTER — Telehealth: Payer: Self-pay | Admitting: Primary Care

## 2022-03-28 NOTE — Telephone Encounter (Signed)
Patient reports since 8/12 has right lower leg swelling, redness and pain from knee to above ankle. [place] and did have some swelling of bilateral leg, used compressions stocking, left legs swelling resolved, right leg getting worse.

## 2022-03-28 NOTE — Telephone Encounter (Signed)
Pt called to report that his Right leg (knee to ankle) is painful, red and swollen since Saturday, 8/12  No other SX  Pt requested an appt  Please advise and call pt back

## 2022-03-28 NOTE — Telephone Encounter (Signed)
Spoke with patient, he will be seen in urgent care today for evaluation.

## 2022-03-29 LAB — UNMAPPED LAB RESULTS
Hematocrit (HT): 45 % (ref 40–52)
Hemoglobin (HGB) (HT): 15.7 g/dL (ref 13.0–17.5)
MCHC (HT): 35 g/dL (ref 32.0–36.0)
MCV (HT): 95.9 fL (ref 81.0–99.0)
Mean Corpuscular Hemoglobin (MCH) (HT): 33.5 pg (ref 26.0–34.0)
Platelets (HT): 279 10 3/uL (ref 140–400)
RBC (HT): 4.68 10 6/uL (ref 4.20–5.90)
RDW (HT): 12 % (ref 11.5–15.0)
WBC (HT): 6.3 10 3/uL (ref 4.0–10.8)

## 2022-04-10 ENCOUNTER — Encounter: Payer: Self-pay | Admitting: Gastroenterology

## 2022-04-30 ENCOUNTER — Other Ambulatory Visit: Payer: Self-pay | Admitting: Gastroenterology

## 2022-05-02 ENCOUNTER — Other Ambulatory Visit
Admission: RE | Admit: 2022-05-02 | Discharge: 2022-05-02 | Disposition: A | Discharge status: Home / Self Care | Payer: Medicare (Managed Care) | Source: Ambulatory Visit | Attending: Oncology | Admitting: Oncology

## 2022-05-02 ENCOUNTER — Other Ambulatory Visit: Payer: Self-pay | Admitting: Gastroenterology

## 2022-05-02 ENCOUNTER — Other Ambulatory Visit: Payer: Self-pay

## 2022-05-02 DIAGNOSIS — R7982 Elevated C-reactive protein (CRP): Secondary | ICD-10-CM | POA: Insufficient documentation

## 2022-05-02 DIAGNOSIS — M109 Gout, unspecified: Secondary | ICD-10-CM | POA: Insufficient documentation

## 2022-05-02 DIAGNOSIS — C61 Malignant neoplasm of prostate: Secondary | ICD-10-CM | POA: Insufficient documentation

## 2022-05-02 LAB — COMPREHENSIVE METABOLIC PANEL
ALT: 19 U/L (ref 0–50)
AST: 16 U/L (ref 0–50)
Albumin: 4.2 g/dL (ref 3.5–5.2)
Alk Phos: 101 U/L (ref 40–130)
Anion Gap: 11 (ref 7–16)
Bilirubin,Total: 0.9 mg/dL (ref 0.0–1.2)
CO2: 25 mmol/L (ref 20–28)
Calcium: 9.4 mg/dL (ref 8.6–10.2)
Chloride: 103 mmol/L (ref 96–108)
Creatinine: 0.93 mg/dL (ref 0.67–1.17)
Glucose: 101 mg/dL — ABNORMAL HIGH (ref 60–99)
Lab: 23 mg/dL — ABNORMAL HIGH (ref 6–20)
Potassium: 5.1 mmol/L (ref 3.3–5.1)
Sodium: 139 mmol/L (ref 133–145)
Total Protein: 6.5 g/dL (ref 6.3–7.7)
eGFR BY CREAT: 86 *

## 2022-05-02 LAB — DIFF MANUAL
Diff Based On: 100 CELLS
Metamyelocyte %: 2 % — ABNORMAL HIGH (ref 0–1)

## 2022-05-02 LAB — CBC AND DIFFERENTIAL
Baso # K/uL: 0 10*3/uL (ref 0.0–0.2)
Basophil %: 0 %
Eos # K/uL: 0 10*3/uL (ref 0.0–0.5)
Eosinophil %: 0 %
Hematocrit: 49 % (ref 37–52)
Hemoglobin: 16.5 g/dL (ref 12.0–17.0)
Lymph # K/uL: 2.4 10*3/uL (ref 1.0–5.0)
Lymphocyte %: 22 %
MCH: 32 pg (ref 26–32)
MCHC: 34 g/dL (ref 32–37)
MCV: 96 fL (ref 75–100)
Mono # K/uL: 1.3 10*3/uL — ABNORMAL HIGH (ref 0.1–1.0)
Monocyte %: 12 %
Neut # K/uL: 6.9 10*3/uL — ABNORMAL HIGH (ref 1.5–6.5)
Nucl RBC # K/uL: 0 10*3/uL (ref 0.0–0.0)
Nucl RBC %: 0 /100 WBC (ref 0.0–0.2)
Platelets: 282 10*3/uL (ref 150–450)
RBC: 5.1 MIL/uL (ref 4.0–6.0)
RDW: 12.8 % (ref 0.0–15.0)
Seg Neut %: 64 %
WBC: 10.8 10*3/uL (ref 3.5–11.0)

## 2022-05-02 LAB — TESTOSTERONE (ADULT MALES OR INDIVIDUALS ON TESTOSTERONE THERAPY): Testosterone: 119 ng/dL — ABNORMAL LOW (ref 193–740)

## 2022-05-02 LAB — PSA (EFF.4-2010): PSA (eff. 4-2010): <0.02 ng/mL (ref 0.00–4.00)

## 2022-05-02 LAB — URIC ACID: Urate: 7.2 mg/dL (ref 3.9–9.0)

## 2022-05-02 LAB — MULTIPLE ORDERING DOCS

## 2022-05-02 LAB — CRP: CRP: 3 mg/L (ref 0–8)

## 2022-05-02 LAB — NEUTROPHIL #-INSTRUMENT: Neutrophil #-Instrument: 7 10*3/uL

## 2022-05-10 ENCOUNTER — Other Ambulatory Visit: Payer: Self-pay | Admitting: Gastroenterology

## 2022-05-14 ENCOUNTER — Telehealth: Payer: Self-pay | Admitting: Primary Care

## 2022-05-14 NOTE — Telephone Encounter (Signed)
Pt is at the pharmacy to get his rsv vaccine and would like to know if it is okay to get it with his history of afib?

## 2022-05-14 NOTE — Telephone Encounter (Signed)
Called pt  relayed Dr. Ashok Cordia message that it's ok for pt to get RSV vaccine

## 2022-07-17 ENCOUNTER — Telehealth: Payer: Self-pay | Admitting: Primary Care

## 2022-07-17 NOTE — Telephone Encounter (Addendum)
Patient needs an insurance referral:    Doctor:Thomas Rutherford Limerick, MD-[facility]  [address] [place], [place], [place] [address]    JXB:[id number]    Diagnosis:N36.1    Date of Service:07/19/22    Insurance Number:[id number]    Auth needed for 12 visits, [id number], [id number], [id number], [id number], [id number], [id number], [id number], [id number], [id number], [id number], [phone]    Called [phone]

## 2022-07-17 NOTE — Telephone Encounter (Addendum)
Faxed all clinical info including info from referring MD to pt's insurance as requested.  To [phone]  Marked as Urgent.

## 2022-07-17 NOTE — Telephone Encounter (Addendum)
Attn: Case ID #[id number]  Fax [phone] to fax clinicals.      Direct #Emily at [facility] Will fax referral info from Dr. Armanda Heritage to send with referral request.

## 2022-07-18 NOTE — Telephone Encounter (Signed)
Called pt's insurance, spoke with Victorino [phone] who transferred me to [phone] as pt has HMO plan  Spoke with Sam, case is pending review, she called UM to get case expedited, even so it can take 7 days  Sam spoke with Nurse and she will review Urgently and let us know.  Spoke with pt, aware nurse is reviewing urgently, and will call him back once approved.

## 2022-07-19 NOTE — Telephone Encounter (Signed)
Prior Auth update-  All office visits - Denied. Patient's insurance is good for HMO in network & some out of network in [place].  Patient is covered to go to Urgent Care or the hospital  in any state.  Appeal phone #[phone].  Ref Case #[id number]. Can not do a Audiological scientist.

## 2022-07-19 NOTE — Telephone Encounter (Addendum)
Called and spoke with pt, aware Urology visits denied in [facility]  Pt states he saw Urologist  below about 50 times in [place], he made Ins aware as they just called him about denial.  Ins will call him back on Monday and pt will let me know how he would like to proceed.

## 2022-07-22 NOTE — Telephone Encounter (Addendum)
Called  Appeal phone #[phone]   Faxed appeal to [phone]  Case #[id number]   Marked Urgent  Urology office in [place] requested 12 approved visits

## 2022-07-23 NOTE — Telephone Encounter (Signed)
Called [employer] again, spoke with Didy  Provided Dr. Ashok Cordia NPI for [facility]

## 2022-07-23 NOTE — Telephone Encounter (Addendum)
Called [phone] and spoke with Jory Sims at [employer].      She will have Jasmine call back  Dr. Algis Downs agreed to do Appeals.

## 2022-07-23 NOTE — Telephone Encounter (Addendum)
Jasmine at [employer] [phone]- called back.  She will hopefully have an answer tomorrow regarding Urology visits x 12    [facility] in [place], spoke with [place] regarding this.

## 2022-07-25 NOTE — Telephone Encounter (Signed)
Maxwell Wells called back, appeal denied, she placed it on 2nd level appeal, and will call back to let me know.

## 2022-07-25 NOTE — Telephone Encounter (Signed)
Pt.notified

## 2022-07-29 NOTE — Telephone Encounter (Signed)
Received new referral request from [facility] in [place] for Approval  Pt's insurance will be calling me back as Jasmin placed a 2nd appeal     1st appeal was denied.  New appt date with Urologist in [place] is 08/14/2022

## 2022-07-30 NOTE — Telephone Encounter (Signed)
Called and spoke with Langley at [phone], she did not hear back regarding Urgent appeal.  Leavy Cella will f/u

## 2022-08-07 ENCOUNTER — Ambulatory Visit: Payer: Medicare (Managed Care) | Admitting: Rheumatology

## 2022-08-07 ENCOUNTER — Encounter: Payer: Self-pay | Admitting: Rheumatology

## 2022-08-07 ENCOUNTER — Other Ambulatory Visit: Payer: Self-pay

## 2022-08-07 VITALS — BP 161/80 | Temp 98.5°F | Ht 76.0 in | Wt 246.0 lb

## 2022-08-07 DIAGNOSIS — M1990 Unspecified osteoarthritis, unspecified site: Secondary | ICD-10-CM

## 2022-08-07 MED ORDER — PREDNISONE 20 MG PO TABS *I*
ORAL_TABLET | ORAL | 1 refills | Status: DC
Start: 2022-08-07 — End: 2023-07-30

## 2022-08-07 NOTE — Progress Notes (Signed)
Rheumatology Follow-Up Note     PRIMARY CARE PHYSICIAN: Otilio Miu, MD    RHEUMATOLOGY SNAPSHOT: No specialty comments available.        Subjective   HPI:   Maxwell Wells is a 76 y.o. male who is here for follow up of inflammatory arthritis.    Patient last seen in July for right wrist pain.    Since this time has had a flare about 2 months ago in right knee pain. No noticeable swelling or redness.  5 days of 20mg  prednisone improved symptoms within 24 hrs.      Today is having right hip pain that started 3 days ago.  Range of motion normal but hurts with weight bearing. Not currently on prednisone.      Denies other joint involvement.  Denies rashes. Denies flares with changes to diet. Drinks one alcoholic drink daily.                 Current Meds:       predniSONE (DELTASONE) 20 mg tablet [[id number]]  Current Outpatient Medications   Medication Sig    atorvastatin (LIPITOR) 20 MG tablet     predniSONE (DELTASONE) 20 mg tablet Take one tablet daily as needed for joint pain.    loperamide (IMODIUM) 2 MG capsule Take 1 capsule (2 mg total) by mouth daily                      REVIEW of SYSTEMS:    Constitutional: No weight loss.    Musculoskeletal: See HPI for details.    Skin: No rash and no photosensitivity.    Eyes: No pain and no eye dryness.    ENT:    Respiratory: No shortness of breath and no cough.    Cardiac: No chest pain.    Gastrointestinal: No nausea, no vomiting, no diarrhea and no constipation.    Genitourinary:    Endocrine:    Allergic/Immunologic:    Hematologic:    Neurologic:    Psychiatric:            Objective    PHYSICAL EXAMINATION:  BP 161/80   Pulse (P) 85   Temp 36.9 C (98.5 F) (Temporal)   Ht 1.93 m (6\' 4" )   Wt 111.6 kg (246 lb)   BMI 29.94 kg/m   Body mass index is 29.94 kg/m.  Pain    08/07/22 1055   PainSc:   1   PainLoc: Generalized     Constitutional:    Well-developed and well-nourished.    Eyes:    Pupils are equal, round, and reactive and conjunctivae appear  normal.     HENT:   Normocephalic, atraumatic, the nose is normal and the oropharynx is clear and moist.     Cardiovascular:    Normal rate and intact distal pulses.    Pulmonary:    Effort is normal and breath sounds are normal.    Skin:    Skin is warm and dry.    Neuro:    He is alert and is oriented.     Musculoskeletal Exam:   MSK Comments  No synovitis in hands, wrists, ankles, feet.   Normal ROM without swelling or tenderness in shoulders, elbows, knees  Hips normal ROM  No pain over trochanteric bursa          Jump to joint exam  Joint Exam 08/07/2022     No joint exam has been documented for this visit  PRIOR STUDIES:  Labs:     - Hematology labs        Lab results: 05/02/22  1422 01/29/22  1021   WBC 10.8 9.2*   Hemoglobin 16.5 15.1   Hematocrit 49 46   Platelets 282 301   MCV 96 98*   Seg Neut % 64.0 70.4   Lymphocyte % 22.0 14.3   Monocyte % 12.0 12.0   Eosinophil % 0.0 1.7   Basophil % 0.0 0.7     - Chemistries labs        Lab results: 05/02/22  1422 01/29/22  1021   Sodium 139 140   Potassium 5.1 4.8   CO2 25 25   Chloride 103 104   UN 23* 15   Creatinine 0.93 0.87   Glucose 101* 102*   Calcium 9.4 8.9   Total Protein 6.5 6.6   Albumin 4.2 4.0   Bilirubin,Total 0.9 1.3*   Bilirubin,Direct  --  0.3   AST 16 20   ALT 19 27   Alk Phos 101 76      - Inflammatory markers etc -       Lab results: 05/02/22  1422 01/29/22  1021   Sedimentation Rate  --  18   CRP 3 58*   Urate 7.2 6.6     - Urine studies labs No results for input(s): "UCOL", "USG", "UOH", "ULEU", "UNIT", "UPRO", "UBLD", "KETONESU", "GLUCOSEU", "UWBC", "URBC", "USQUA", "UTPR", "CREAU", "UCRNC", "TPCREATRATIO" in the last 8760 hours.  - Rheum serologies        Lab results: 01/29/22  1021   Rheumatoid Factor <8   Cyclic Citrullin Peptide Ab 0.6     - Micro serologies No results for input(s): "LYMRL", "ASOS", "ASOT", "DNSB", "PGAB", "PMAB", "EBVG", "EBVM", "EBNA", "CMVG", "CMVM" in the last 8760 hours.     Radiology:       -Recent  imaging reports reviewed -- please see ERecord imaging section for details.      Assessment    IMPRESSION:       ICD-10-CM ICD-9-CM    1. Inflammatory arthritis  M19.90 714.9 Uric acid          Maxwell Wells is a 76 y.o. male who is here for follow up of inflammatory arthritis.    Symptoms somewhat inflammatory in nature however no significant swelling, redness, or warmth that are typical of crystalline arthropathy.      On bedside US we did see possible crystal deposition in quadriceps tendon at insertion on patella. No significant increase in doppler signal.      Patient typically responds quickly with 20mg  of prednisone daily. For now we will treat with as needed.    Recheck serum urate with next labs    Follow up in 6 months.          RECOMMENDATIONS:      Patient Instructions   Take one tablet of prednisone daily for 3-5 days as needed.

## 2022-08-07 NOTE — Patient Instructions (Signed)
Take one tablet of prednisone daily for 3-5 days as needed.

## 2022-08-08 NOTE — Telephone Encounter (Signed)
Called Jasmine at [phone]  lm

## 2022-08-13 NOTE — Telephone Encounter (Addendum)
Spoke with Sydell Axon at [facility], she was given Jasmine's phone # at [employer] to call about 2nd Appeal.

## 2022-12-23 ENCOUNTER — Encounter: Payer: Self-pay | Admitting: Gastroenterology

## 2022-12-23 NOTE — Patient Instructions (Signed)
Steroid Injection - Aftercare Instructions     General injection information:  "Cortisone shots" (steroid injections) send a strong anti-inflammatory medication directly to the area where it is needed (joint, tendon sheath, bursa, etc.).  Anesthetic (numbing medication) is used in combination with the steroid to provide some immediate but temporary pain relief.  Your injection may be completed under "landmark" guidance vs ultrasound guidance. Landmark uses approximate body location for the injection while ultrasound uses direct visualization of needle and medication placement.    Post-injection expectations:  First few hours: You may notice immediate improvement due to the numbing medication. This will typically wear off in the first several hours.  First few days: It can be NORMAL to have "post injection pain" that will subside with time  Days 3-7: You may begin to notice some relief during this time as the cortisone kicks in.  Weeks 1-4: Gradually increased pain relief should occur as the cortisone works to full effect  NOTE - Give cortisone the full 4 weeks before saying it did not work    Common Side Effects:  Injection site tenderness and temporary swelling can occur, can be minimal to significant but usually mild.  Facial flushing (ie. warmth and redness) can occur and will resolve within a couple days.   Mild Palpitations/restlessness are not common but can occur for 24-48 hours  Mood swings are not common but can occur    Injection Risks:  Infection, although uncommon, anytime a needle breaks the skin this does increase the risk of potential infection. Utilizing sterile technique further diminishes this risk.   Diabetic considerations: Your blood sugar may rise as a result of the injection. Continue to monitor your blood sugars regularly and take your usual diabetic medications as prescribed (if you are on insulin, you may need to adjust the dose temporarily based on the scale given to you by your primary  care physician). Your blood sugar should stabilize within 10 days.    Recommended post-injection aftercare:  Apply ice or heat (whichever offers more comfort) as needed.  If able, Tylenol (acetaminophen) or NSAIDs can be taken as needed.   You may apply a topical cortisone at injection site if itching or rash occurs.   Oral Benadryl (diphenhydramine) can also be used for fascial flushing.  Avoid hot tub use for 2 days to limit risk of infection.  Activity can be to tolerance, if you are sore then take it easy.    When to call the office:  No relief from injection in 4 weeks - schedule follow up visit to discuss.   Persistent redness, fever/chills, pain, drainage 3-7 days post-injection.  Pain or swelling extending beyond the injection site.    When to call 911 or seek immediate medical care:  Call 911 if you develop swelling of tongue/face, "throat closing" sensation, difficulty breathing, chest pain. This may not be injection related but emergency services are still required.

## 2022-12-23 NOTE — Progress Notes (Signed)
SUBJECTIVE      NAME: Maxwell Wells MRN: [id number]    DOB: 06-27-1946 AGE: 77 y.o. SEX: male     Chief Complaint: Chief Complaint   Patient presents with    Right Hip - Pain        History of Presenting Illness for 12/23/2022      Maxwell Wells is a pleasant 77 y.o. male who presents for evaluation of Right hip pain. Today patient reports that his right hip pain started in 2023 without any specific injury that he can recall.  Patient has not been seen by orthopedics previously for this condition.  Patient does have a significant history of right total knee arthroplasty completed by Dr. Clarene Duke in 2005. Patient presented here for initial evaluation on 12/23/2022.     Today patient localizes all of his pain to the anterior inguinal region and radiates towards the level of the knee.  He denies any numbness or tingling.  He has been taking acetaminophen and ibuprofen for pain relief.     Questionnaire  Patient answers are not available for this visit.       Relevant Past Medical History  Patient's problem list, medications, allergies, past medical, surgical, social/family histories were reviewed and updated where appropriate in the EMR.        Allergies: Allergies   Allergen Reactions    Banana Anaphylaxis        Tobacco History: Patient  reports that he quit smoking about 39 years ago. His smoking use included cigarettes. He has a 15.00 pack-year smoking history. He has never used smokeless tobacco.      Last HA1C: Lab Results   Component Value Date    HBA1C 5.3 12/26/2014        Last Glucose: Lab Results   Component Value Date    GLU 102 04/24/2022        OBJECTIVE     General  BMI: Body mass index is 29.21 kg/m.  Constitutional: Appears well-developed, well-nourished. No acute distress.   Skin: Warm and dry. No rash or erythema.   Neurovascular: Distal Neurovascular status intact.  Right hip:  Inspection: No swelling or deformity.  Gait: Physiologic.  ROM: Flexion: Decreased. Internal rotation: Decreased. External  rotation: Decreased.  Tenderness: Deep anterior groin pain not reproducible with palpation.        Diagnostics:   12/23/2022 [facility] - Ortho - Right Hip - STANDING AP PELVIS, STANDING AP UNILATERAL, DUNN, LATERAL  images obtained.  FINDINGS/IMPRESSION:  Alignment: Normal.    Cartilage: Mild to moderate cartilage loss. .   Femoroacetabular impingement signs: Negative.  Soft tissue/Calcifications: Normal.  No other acute fracture, dislocation or osseous abnormality.     Procedures: Large Jt Inj/Asp: R hip joint    Date/Time: 12/23/2022 2:00 PM    Performed by: Blenda Nicely, DO  Authorized by: Blenda Nicely, DO    Procedural timeout: A procedural pause was conducted to verify: correct patient identity, correct procedure, correct side, correct site, and correct position. I discussed the risks, benefits, and alternatives to the procedure. As well as the risks and benefits of the alternative treatments, including no treatment at all. Following a review of allergies and review of potential side effects and complications, all questions were answered and consent was given to proceed.    Procedure Details:     Technique: Prior to the procedure, the appropriate landmarks were used to determine the optimal needle path and placement. Following this, the skin was marked and sterilely prepared in  the usual manner. An appropriately sized needle for the procedure and clinical scenario was used. Medicine was injected in the appropriate location.      Guidance:  Ultrasound    Ultrasound guidance imaging: Images of the procedure have been saved.    Location:  Hip    Site:  R hip joint    Medications (Right):  40 mg triamcinolone acetonide 40 mg/mL; 2 mL lidocaine 10 mg/mL (1 %); 2 mL ROPivacaine (PF) 2 mg/mL (0.2 %)    Outcome: The patient tolerated the procedure without complication and was discharged in good condition after a short observation period. Post procedure instructions were given.         ASSESSMENT/PLAN      1. Primary  osteoarthritis of right hip    2. History of total right knee replacement        Today we reviewed history, clinical findings, working diagnosis and treatment options.  Clinically, he presents with findings most consistent with mild to moderate arthritic change of the right hip which is referring pain through the anterior thigh towards the level of the knee.  Patient does have previous history of right total knee arthroplasty in 2005 with Dr. Clarene Duke.    PLAN:  Exacerbation - This is a chronic illness with exacerbation, disease progression and/or side effects of treatment.  Imaging/result review - Appropriate imaging/diagnostics were updated and/or reviewed in office today to help guide treatment plan.  Chart review - Past records and outside office notes/diagnostics were reviewed today to help optimize work up and treatment and summarized as appropriate.  Diagnostic and therapeutic injection - A diagnostic and therapeutic injection was recommended to help identify source of pain and potentially reduce pain. This may help progress through a conservative treatment plan. Injection completed today as outlined above. This was tolerated well without any immediate complication. Patient was encouraged to keep track of symptoms and document response prior to re-evaluation.  Activity progression - As symptoms improve we will develop a return to activity progression.  Home conservative measures - Can use measures such as over the counter medications, ice or heat, compression and elevation as needed for symptom control.  OTC medications - NSAIDs/analgesics can be used on an as needed basis for management of mild acute flares. Consider consistent use over 1-2 weeks (ie. Naproxen OTC 1-2 tabs, twice daily for 14 days). Can also supplement with acetaminophen as needed for breakthrough pain.  Next appointment considerations - monitor outcome of intra-articular hip injection.  If this does fully alleviate his symptoms, then we could  discuss potentially requiring total hip arthroplasty going forward pending the duration of effect.  Patient is also considering total knee arthroplasty at this time for the LEFT knee.  All questions were answered.  Patient voiced understanding and was agreeable with the current instructions and plan.     Follow up: Return if symptoms worsen or fail to improve.     Electronically signed by:    Radene Knee. Wyline Copas, DO  Interventional Orthopaedics & Sports Medicine  [facility]    12/23/2022, 3:04 PM

## 2023-01-20 ENCOUNTER — Telehealth: Payer: Self-pay | Admitting: Primary Care

## 2023-01-20 MED ORDER — NIRMATRELVIR & RITONAVIR 150-100 MG PO TABS *I*
ORAL_TABLET | ORAL | 0 refills | Status: AC
Start: 2023-01-20 — End: 2023-01-25

## 2023-01-20 NOTE — Telephone Encounter (Signed)
I sent in Paxlovid.  Please have him hold his atorvastatin for 10 days.

## 2023-01-20 NOTE — Telephone Encounter (Signed)
Positive COVID test      1. Significant trouble breathing (i.e. can not talk in full sentences)?   no      Additional information:  On what date did your symptoms start? 6/8    On what date did you test positive? 6/10    Wheezing?  no  Temperature greater than 100.4 F?  no  Nasal congestion?   yes  Sore throat?  no  Nausea?  no  Vomiting?  no  Diarrhea?  no  Headches?  no  Body aches?  no  Chills?  no  Is the patient requesting an antiviral medication?  yes    Cough, sinus pressure

## 2023-01-20 NOTE — Telephone Encounter (Signed)
Spoke with patient, informed of paxlovid order and instructions to  hold his atorvastatin for 10 days. Patient verbalized understanding

## 2023-02-14 ENCOUNTER — Ambulatory Visit: Payer: Medicare Other | Admitting: Rheumatology

## 2023-02-17 ENCOUNTER — Encounter: Payer: Self-pay | Admitting: Rheumatology

## 2023-02-17 ENCOUNTER — Other Ambulatory Visit: Payer: Self-pay

## 2023-02-17 ENCOUNTER — Ambulatory Visit: Payer: Medicare Other | Attending: Rheumatology | Admitting: Rheumatology

## 2023-02-17 VITALS — BP 137/86 | HR 107 | Temp 97.2°F | Ht 76.0 in | Wt 241.0 lb

## 2023-02-17 DIAGNOSIS — M1990 Unspecified osteoarthritis, unspecified site: Secondary | ICD-10-CM | POA: Insufficient documentation

## 2023-02-17 NOTE — Progress Notes (Signed)
Rheumatology Follow-Up     PRIMARY CARE PHYSICIAN: Otilio Miu, MD    VISIT DIAGNOSIS:    1. Inflammatory arthritis         HPI:   Maxwell Wells is a 77 y.o. year old male who is here for follow-up of   1. Inflammatory arthritis      We are seeing him for episodes of pain, swelling and stiffness affecting different joint areas.  Has previously had involvement of the basal joint of the right thumb, more recently also involvement of knee and AC joint.  Uses short courses of prednisone that typically help resolve this quickly.  He carries a history of gout, history of renal cell carcinoma, prostate cancer and atrial fibrillation.  History of osteoarthritis of the knees.  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  In the past, elevated uric acid levels.  Has in the past used allopurinol, reports question of liver injury, not on this medication currently.  Previously, a musculoskeletal ultrasound study of the right hand and wrist that was negative.  Has had levels of serum urate between 6 and 7 previously.  Most recent labs from [place] are not available yet.    PAST MEDICAL HISTORY:   Past Medical History:   Diagnosis Date    AF (atrial fibrillation)     Prostate cancer 10/18/2010    Renal cell cancer 10/18/2010      Past Surgical History:   Procedure Laterality Date    PARTIAL NEPHRECTOMY      PROSTATE SURGERY          ALLERGIES: Food    CURRENT MEDICATIONS:   Outpatient Encounter Medications as of 02/17/2023   Medication Sig Dispense Refill    atorvastatin (LIPITOR) 20 MG tablet       predniSONE (DELTASONE) 20 mg tablet Take one tablet daily as needed for joint pain. 20 tablet 1     No facility-administered encounter medications on file as of 02/17/2023.         FAMILY HISTORY:  Family History   Problem Relation Age of Onset    High Blood Pressure Mother     Heart Disease Father     High cholesterol Father     High Blood Pressure Father        SOCIAL HISTORY:  Social History     Socioeconomic History     Marital status: Married   Tobacco Use    Smoking status: Former     Packs/day: 0.50     Years: 15.00     Additional pack years: 0.00     Total pack years: 7.50     Types: Cigarettes    Smokeless tobacco: Never   Substance and Sexual Activity    Alcohol use: Yes     Comment: social    Drug use: Never         ROS :   Episodes of right hand and wrist pain and swelling.    PHYSICAL EXAMINATION:  BP 137/86   Pulse 107   Temp 36.2 C (97.2 F) (Temporal)   Ht 1.93 m (6\' 4" )   Wt 109.3 kg (241 lb)   BMI 29.34 kg/m   Body mass index is 29.34 kg/m.  Well-developed, well-nourished patient in no acute distress. appreciated.  No enlarged cervical, supraclavicular, infraclavicular or axillary lymph nodes are palpable.  Chest is clear to auscultation.  Heart S1, S2.  Extremity examination shows no synovitis in the DIP joints, PIP joints, MCP joints  or wrists.  Elbows and shoulders without palpable fluid collection.  Knees have no palpable effusion.  Ankles have no edema.  Compression of the MTP joints is not tender.  Skin examination without rash.    PRIOR STUDIES:    Labs:   - Recent labs reviewed -- please see ERecord results review section for values.  - All labs, last 3 months    No visits with results within 3 Month(s) from this visit.   Latest known visit with results is:   Hospital Outpatient Visit on 05/02/2022   Component Date Value    CRP 05/02/2022 3     Urate 05/02/2022 7.2     WBC 05/02/2022 10.8     RBC 05/02/2022 5.1     Hemoglobin 05/02/2022 16.5     Hematocrit 05/02/2022 49     MCV 05/02/2022 96     MCH 05/02/2022 32     MCHC 05/02/2022 34     RDW 05/02/2022 12.8     Platelets 05/02/2022 282     Seg Neut % 05/02/2022 64.0     Lymphocyte % 05/02/2022 22.0     Monocyte % 05/02/2022 12.0     Eosinophil % 05/02/2022 0.0     Basophil % 05/02/2022 0.0     Neut # K/uL 05/02/2022 6.9 (H)     Lymph # K/uL 05/02/2022 2.4     Mono # K/uL 05/02/2022 1.3 (H)     Eos # K/uL 05/02/2022 0.0     Baso # K/uL 05/02/2022 0.0      Nucl RBC % 05/02/2022 0.0     Nucl RBC # K/uL 05/02/2022 0.0     Sodium 05/02/2022 139     Potassium 05/02/2022 5.1     Chloride 05/02/2022 103     CO2 05/02/2022 25     Anion Gap 05/02/2022 11     UN 05/02/2022 23 (H)     Creatinine 05/02/2022 0.93     eGFR BY CREAT 05/02/2022 86     Glucose 05/02/2022 101 (H)     Calcium 05/02/2022 9.4     Total Protein 05/02/2022 6.5     Albumin 05/02/2022 4.2     Bilirubin,Total 05/02/2022 0.9     AST 05/02/2022 16     ALT 05/02/2022 19     Alk Phos 05/02/2022 101     Testosterone 05/02/2022 119 (L)     PSA (eff. 11-2008) 05/02/2022 <0.02     Neutrophil #-Instrument 05/02/2022 7.0     Mult Providers 05/02/2022 see below     Metamyelocyte % 05/02/2022 2 (H)     Manual DIFF 05/02/2022 RESULTS     Diff Based On 05/02/2022 100      Radiology:   -Recent images reviewed personally.  [facility] ultrasound studies were reviewed with the patient, no tophi noted.    IMPRESSION:  Episodic joint pain, gout remains a possibility given his marginal serum urate levels.  He has episodes about every 3 months, these last for 2 or 3 days, self treats with glucocorticoids with good success.  I suggested he continue this and see [place] back in about a year.  If he has more frequent attacks, he may present his sooner for a reevaluation.      RECOMMENDATIONS:   1. Inflammatory arthritis

## 2023-02-28 ENCOUNTER — Ambulatory Visit: Payer: Medicare (Managed Care) | Admitting: Rheumatology

## 2023-05-14 ENCOUNTER — Telehealth: Payer: Self-pay | Admitting: Primary Care

## 2023-05-14 ENCOUNTER — Other Ambulatory Visit: Payer: Self-pay

## 2023-05-14 ENCOUNTER — Encounter: Payer: Self-pay | Admitting: Internal Medicine

## 2023-05-14 ENCOUNTER — Ambulatory Visit: Payer: Medicare Other | Attending: Internal Medicine | Admitting: Internal Medicine

## 2023-05-14 ENCOUNTER — Ambulatory Visit
Admission: RE | Admit: 2023-05-14 | Discharge: 2023-05-14 | Disposition: A | Discharge status: Home / Self Care | Payer: Medicare Other | Source: Ambulatory Visit

## 2023-05-14 VITALS — BP 158/82 | HR 51 | Temp 97.2°F | Ht 76.0 in | Wt 248.5 lb

## 2023-05-14 DIAGNOSIS — R079 Chest pain, unspecified: Secondary | ICD-10-CM

## 2023-05-14 DIAGNOSIS — Z9109 Other allergy status, other than to drugs and biological substances: Secondary | ICD-10-CM | POA: Insufficient documentation

## 2023-05-14 DIAGNOSIS — C649 Malignant neoplasm of unspecified kidney, except renal pelvis: Secondary | ICD-10-CM | POA: Insufficient documentation

## 2023-05-14 DIAGNOSIS — I480 Paroxysmal atrial fibrillation: Secondary | ICD-10-CM | POA: Insufficient documentation

## 2023-05-14 DIAGNOSIS — Z95818 Presence of other cardiac implants and grafts: Secondary | ICD-10-CM | POA: Insufficient documentation

## 2023-05-14 DIAGNOSIS — I10 Essential (primary) hypertension: Secondary | ICD-10-CM | POA: Insufficient documentation

## 2023-05-14 NOTE — Telephone Encounter (Signed)
Patient called the office, he has been having chest pain on both sides of chest near underarm for the past week. States it does not feel like his heart, feels like pleurisy which he has had in the past. Does not hurt to take a breath. No pain down left arm, neck, back or jaw. No difficulty breathing. Does not feel nauseous, clammy or faint. Pain feels worse at the end of the day. States he has a fib and pulse in normal. Cannot remember exact reading. Reviewed with Dr. Sherrin Daisy, scheduled with Darl Pikes, NP this morning.

## 2023-05-14 NOTE — Telephone Encounter (Signed)
Pt states he has been having chest pains that comes and goes with some sob. Pt has more chest pain when resting then when active. The pain radiates down toward both arms.

## 2023-05-14 NOTE — Progress Notes (Signed)
Seen with NP student, Maxwell Wells    Subjective:  Maxwell Wells is a 77M with PMH of renal cell carcinoma, prostate cancer, and atrial fibrillation being seen today for 10 days of bilateral chest pain. Pain is located inferior to bilateral nipples from anterior axillary to midclavicular lines. He describes the pain as being 2/10, dull and annoying. It is there consistently. He has been alternating tylenol and ibuprofen with positive effect. He notices taking a deep breath also offers some relief. Nothing makes it worse. He enjoys golfing three times a week and is able to continue with this without exacerbation. He denies certain movements making it worse.     Denies severe chest pain, radiating pain, new palpitations, shortness of breath, diaphoresis, or nausea. Denies change in bowel function.   He denies recent illness, although I do see he tested positive for COVID 01/20/2023.     He does endorse lifelong environmental allergies and a persistent nonproductive cough for as long as he can remember. This cough is present now but not worse than usual. He does not take anything for his allergies.     He has not been in our office for routine follow-up in a while.  He is up-to-date with a recent cardiology visit with Dr. Lynnea Wells, 02/17/2023.  I have reviewed this note.  He has a Watchman device and controlled A-fib.  He is taking his Lipitor as directed.    He sees a family medicine doctor in [place] regularly.    He also sees rheumatology for inflammatory arthritis.  Mentions that he took his course of prednisone last week for knee pain which has improved at this point.  The prednisone did not help with the chest discomfort.  Last rheumatology visit 02/17/23.  " 77 y.o. year old male who is here for follow-up of   1. Inflammatory arthritis     We are seeing him for episodes of pain, swelling and stiffness affecting different joint areas.  Has previously had involvement of the basal joint of the right thumb, more  recently also involvement of knee and AC joint.  Uses short courses of prednisone that typically help resolve this quickly.  He carries a history of gout, history of renal cell carcinoma, prostate cancer and atrial fibrillation.  History of osteoarthritis of the knees.  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  In the past, elevated uric acid levels.  Has in the past used allopurinol, reports question of liver injury, not on this medication currently.  Previously, a musculoskeletal ultrasound study of the right hand and wrist that was negative....   Episodic joint pain, gout remains a possibility given his marginal serum urate levels.  He has episodes about every 3 months, these last for 2 or 3 days, self treats with glucocorticoids with good success.  I suggested he continue this and see [place] back in about a year.  If he has more frequent attacks, he may present his sooner for a reevaluation.  Maxwell Belfast, MD  Rheumatology"       Current Outpatient Medications   Medication Sig Dispense Refill    atorvastatin (LIPITOR) 20 MG tablet       predniSONE (DELTASONE) 20 mg tablet Take one tablet daily as needed for joint pain. 20 tablet 1     No current facility-administered medications for this visit.       Objective:    Vitals:    05/14/23 1034   BP: 158/82   Pulse: 51  Temp: 36.2 C (97.2 F)   Weight: 112.7 kg (248 lb 8 oz)   Height: 1.93 m (6\' 4" )     Body mass index is 30.25 kg/m.    Recheck BP 142/90.     Physical Exam  Constitutional:       Appearance: Normal appearance.   HENT:      Head: Normocephalic and atraumatic.   Cardiovascular:      Rate and Rhythm: Rhythm irregular.      Pulses: Normal pulses.      Comments: Known atrial fibrillation  Pulmonary:      Effort: Pulmonary effort is normal. No respiratory distress.      Breath sounds: No stridor. Wheezing and rales present. No rhonchi.      Comments: Minor crackles in right lower lobe. Trace expiratory wheeze.   Chest:      Chest wall: No  tenderness.   Abdominal:      General: Abdomen is flat. Bowel sounds are normal.      Palpations: Abdomen is soft.   Skin:     General: Skin is warm and dry.      Capillary Refill: Capillary refill takes less than 2 seconds.   Neurological:      General: No focal deficit present.      Mental Status: He is alert. Mental status is at baseline.   Psychiatric:         Mood and Affect: Mood normal.         Behavior: Behavior normal.         Thought Content: Thought content normal.     Addendum-he is not ill-appearing.  Able to exam table without difficulty.  He has no sternal, chest wall or back tenderness to palpation.  Full upper extremity range of motion without discomfort.    EKG (reviewed with Dr. Sherrin Daisy) reveals A-fib with a rate of 78, no new findings or changes.      Assessment and Plan:  Maxwell Wells is a 77M with PMH of renal cell carcinoma, prostate cancer, and atrial fibrillation being seen today for 10 days of bilateral chest pain.   He describes  2/10 constant dull pain over his bilateral central chest into his bilateral lateral chest.    Denies red flag symptoms of acute coronary syndrome, aortic dissection, or pulmonary embolism. It is not reproducible with palpation or movement. Maxwell Wells has a history of environmental allergies with what he describes as a post nasal drip and minor cough with occasional wheezing for the past 75 years. He does not take anything for this.     Chest pain  EKG done in the office showing known atrial fibrillation with no other concerning findings. No obvious cardiac, pulmonary, or GI etiology.   Chest x ray ordered to rule out other etiology.    (Addendum-chest x-ray results received  " impression  Small amount of linear scar versus atelectasis left lower thorax. "  Will review with patient.  No changes based on these findings.    Educated to seek medical attention if symptoms worsen or if new symptoms present. Red flag symptoms requiring urgent medical attention  including severe chest pain, radiating pain, usual palpitations, shortness of breath, diaphoresis, nausea, or vomiting.     Discussed differential of costochondritis and musculoskeletal causes of the chest pain.  He can continue with Tylenol as needed.    Environmental Allergies  He has had a lifelong history of environmental allergies with post nasal drip, cough, and wheeze. Minor  expiratory wheeze noted on physical exam. He has never been treated for this. Discussed the benefit of starting OTC generic Claritin or nasal Flonase to reduce symptoms.    Elevated BP  He is followed by cardiology. BP elevated at 158/82 today, recheck 142/90. He states it is unusual for his blood pressure to be elevated. Encouraged to check it a couple times a week at home and follow up if elevated.      He is due for routine follow-up with Dr. Sherrin Daisy and will get this scheduled in the next couple months.

## 2023-05-26 ENCOUNTER — Other Ambulatory Visit: Payer: Self-pay | Admitting: Gastroenterology

## 2023-05-26 DIAGNOSIS — K635 Polyp of colon: Secondary | ICD-10-CM

## 2023-05-26 DIAGNOSIS — K5730 Diverticulosis of large intestine without perforation or abscess without bleeding: Secondary | ICD-10-CM

## 2023-06-11 DIAGNOSIS — G2581 Restless legs syndrome: Secondary | ICD-10-CM | POA: Insufficient documentation

## 2023-06-11 DIAGNOSIS — M1120 Other chondrocalcinosis, unspecified site: Secondary | ICD-10-CM | POA: Insufficient documentation

## 2023-07-29 ENCOUNTER — Other Ambulatory Visit: Payer: Self-pay

## 2023-07-30 ENCOUNTER — Encounter: Payer: Self-pay | Admitting: Primary Care

## 2023-07-30 ENCOUNTER — Ambulatory Visit: Payer: Medicare Other | Attending: Primary Care | Admitting: Primary Care

## 2023-07-30 VITALS — BP 132/70 | HR 80 | Temp 97.5°F | Ht 75.98 in | Wt 245.0 lb

## 2023-07-30 DIAGNOSIS — Z95818 Presence of other cardiac implants and grafts: Secondary | ICD-10-CM | POA: Insufficient documentation

## 2023-07-30 DIAGNOSIS — I480 Paroxysmal atrial fibrillation: Secondary | ICD-10-CM | POA: Insufficient documentation

## 2023-07-30 DIAGNOSIS — Z0000 Encounter for general adult medical examination without abnormal findings: Secondary | ICD-10-CM | POA: Insufficient documentation

## 2023-07-30 DIAGNOSIS — C649 Malignant neoplasm of unspecified kidney, except renal pelvis: Secondary | ICD-10-CM | POA: Insufficient documentation

## 2023-07-30 NOTE — Progress Notes (Signed)
Maxwell Wells is a very pleasant 77 year old gentleman who comes to our office today for a routine office visit.  He was seen 2 months ago with an elevated blood pressure.  He has been in [place] and has been more active.  His blood pressures have been fairly good.  He has a complicated urologic history.  Has had prostate cancer, renal cell carcinoma, and has had multiple procedures.  Despite this he is doing reasonably well and is up-to-date with urology.  He has a history of paroxysmal atrial fibrillation.  States that he does not go into A-fib very often.  Off anticoagulation secondary to bleeding.  Has a Watchman device in his left atrial appendage.  Has been doing well.    Medications reviewed and updated in EMR  List reflects end of visit medication list.  Problem list updated.  Current Outpatient Medications   Medication Sig Dispense Refill    rOPINIRole (REQUIP) 0.25 mg tablet Take 1 tablet (0.25 mg total) by mouth nightly.      atorvastatin (LIPITOR) 20 MG tablet        No current facility-administered medications for this visit.     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  Blood pressure 142/76, pulse 80, temperature 36.4 C (97.5 F), height 1.93 m (6' 3.98"), weight 111.1 kg (245 lb), SpO2 (P) 99%.    Pleasant gentleman in no apparent distress.  Initial blood pressure was high but subsequent measurement was normal at 132/70  Skin warm and dry with actinic changes present.  His lungs are clear.  Cardiac exam reveals a regular rhythm.  Soft systolic murmur.  No S3 gallop.  No  edema.      Maxwell Wells is a very pleasant 76 year old gentleman here for a routine office visit.  Medicare wellness assessment done.  He will be going back to [place] soon.  He also gets a fair amount of healthcare in [place].  I did not change any medication as he is on minimal meds.  I think his blood pressure is good but I asked him to continue to follow it.  I plan to see him this summer when he returns and we will check lab test.  I told him he can call me at any time when he is in [place].    Visit performed as:            Office Visit, met with patient in person    Today we reviewed and updated Maxwell Wells's smoking status, activities of daily living, depression screen, fall risk, medications and allergies.   I have counseled the patient in the above areas.     Subjective:     Chief Complaint: Maxwell Wells is a 77 y.o. male here for a/an Subsequent Annual Medicare Visit and Hyperlipidemia    In general, Maxwell Wells rates their overall health as:  good      Patient Care Team:  Otilio Miu, MD as PCP - General  Murlean Caller, MD as Surgeon  Vesta Mixer, Philomena Course, MD (Hematology and Oncology)  Melbourne Abts, NP (Oncology)  Jefferey Pica, RN as **Registered Nurse (RN) (Oncology)     Current Outpatient Medications on File Prior to Visit   Medication Sig Dispense Refill    rOPINIRole (REQUIP) 0.25 mg tablet Take 1 tablet (0.25 mg total) by mouth nightly.      atorvastatin (LIPITOR) 20 MG tablet        No current facility-administered medications on file prior to visit.     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  24 mm watchman flex      Atrial fibrillation 11/26/2011    Other and  unspecified hyperlipidemia 08/23/2011    Prostate cancer 10/18/2010    Renal cell cancer 10/18/2010    Male Erectile Disorder 04/29/2005     Created by Conversion        Arthritis 04/29/2005     Created by Conversion        Nephrolithiasis 04/29/2005     Created by Conversion        Post Prostatectomy 04/29/2005     Created by Conversion         Past Medical History:   Diagnosis Date    AF (atrial fibrillation)     Prostate cancer 10/18/2010    Renal cell cancer 10/18/2010     Past Surgical History:   Procedure Laterality Date    PARTIAL NEPHRECTOMY      PROSTATE SURGERY       Family History   Problem Relation Age of Onset    High Blood Pressure Mother     Heart Disease Father     High cholesterol Father     High Blood Pressure Father      Social History     Socioeconomic History    Marital status: Married   Tobacco Use    Smoking status: Former     Packs/day: 0.50     Years: 15.00     Additional pack years: 0.00     Total pack years: 7.50     Types: Cigarettes    Smokeless tobacco: Never   Substance and Sexual Activity    Alcohol use: Yes     Comment: social    Drug use: Never       Objective:     Vital Signs: BP 142/76   Pulse 80   Temp 36.4 C (97.5 F)   Ht 1.93 m (6' 3.98")   Wt 111.1 kg (245 lb)   SpO2 (P) 99%   BMI 29.83 kg/m    BMI: Body mass index is 29.83 kg/m.    Vision Screening Results (Welcome visit only):  No results found.    Depression Screening Results:  Review Flowsheet          07/30/2023 01/22/2021 07/04/2020 04/26/2019 11/21/2016   PHQ Scores   PSQ2 Q1 - Interest/Pleasure - - - N N   PSQ2 Q2 - Down, Depressed, Hopeless - - - N N   PHQ Calculated Score 0 0 0 - -      Details  No questionnaires on file.   Opioid Use/DAST- 10 Screening Results:   How many times in the past year have you used an illegal drug or used a prescription medication for nonmedical reasons?: (Patient-Rptd) 0 (05/14/2023  9:16 AM)    Activities of Daily Living/Functional Screening Results:  Is the person deaf  or does he/she have serious difficulty hearing?: N (07/30/2023 11:15 AM)  Is this person blind or does he/she have serious difficulty seeing even when wearing glasses?: N (07/30/2023 11:15 AM)  Does this person have serious difficulty walking or climbing stairs?: N (age related issues) (07/30/2023 11:15 AM)  Does this person have difficulty dressing or bathing?: N (07/30/2023 11:15 AM)  *Shopping: Independent (07/30/2023 11:15 AM)  *House Keeping: Independent (07/30/2023 11:15 AM)  *Managing Own Medications: Independent (07/30/2023 11:15 AM)  *Handling Finances: Independent (07/30/2023 11:15 AM)  Difficulty doing errands due to a physicial, mental or emotional condition: No (07/30/2023 11:15 AM)      Fall Risk Screening Results:  Have you fallen in the last year?: No (07/30/2023 11:14 AM)      Assessment and Plan:     Cognitive Function:  Recall of recent and remote events appears:  Normal      Advanced Care Planning:  reviewed     The following health maintenance plan was reviewed with the patient:    Health Maintenance Topics with due status: Overdue       Topic Date Due    HIV Screening USPST[place] Never done    Hepatitis C Screening USPSTF/[place] Never done    IMM-Zoster Never done     Health Maintenance Topics with due status: Not Due       Topic Last Completion Date    IMM DTaP/Tdap/Td 11/28/2016    Colon Cancer Screening Other 07/04/2023    Depression Screen Yearly 07/30/2023    Fall Risk Screening 07/30/2023     Health Maintenance Topics with due status: Completed       Topic Last Completion Date    IMM Pneumo: 50+ Years 05/17/2016    AAA Screening USPSTF 06/30/2019    IMM-Influenza 05/15/2023    COVID-19 Vaccine 05/15/2023     Health Maintenance Topics with due status: Aged [employer] Date Due    IMM-Hepatitis B Vaccine Aged Out    IMM-HIB 0-5 Yrs or At-Risk Patients Aged Out    IMM-HPV 9-26 Yrs or Shared Decision (27-45 Yrs) Aged Out    IMM-MCV4 0-18 Yrs or At-Risk Patients Aged Out    IMM-Rotavirus 0-8  Months Aged Out     This health maintenance schedule, identified risks, a list of orders placed today and patient goals have been provided to [employer] in the after visit summary.     Plan for any concerns identified during screening or risk assessments:  n/a

## 2023-07-30 NOTE — Patient Instructions (Signed)
Thank you for completing your Subsequent Annual Medicare Visit and Hyperlipidemia   with [place] today.     The purpose of this visits was to:    Screen for disease  Assess risk of future medical problems  Help develop a healthy lifestyle  Update vaccines  Get to know your doctor in case of an illness    Patient Care Team:  Otilio Miu, MD as PCP - General  Manuela Schwartz Aram Candela, MD as Surgeon  Marzella Schlein, MD (Hematology and Oncology)  Melbourne Abts, NP (Oncology)  Jefferey Pica, RN as **Registered Nurse (RN) (Oncology)     Medicare 5 Year Plan    The following items were identified as areas of concern during your screening today:  BMI greater than 25 - This is a risk for Heart Attack, Stroke, High Blood Pressure, Diabetes, High Cholesterol and other complications.       The Health Maintenance table below identifies screening tests and immunizations recommended by your health care team:  Health Maintenance: These screening recommendations are based on USPSTF, [employer], and [place] state guidelines   Topic Date Due   . HIV Screening  Never done   . Hepatitis C Screening  Never done   . Shingles Vaccine (1 of 2) Never done   . Depression - Yearly  07/29/2024   . Fall Risk Screening  07/29/2024   . DTaP/Tdap/Td Vaccines (3 - Td or Tdap) 11/29/2026   . Colon Cancer Screening  07/03/2028   . Flu Shot  Completed   . Abdominal Aortic Aneurysm Screening  Completed   . Pneumococcal Vaccination  Completed   . COVID-19 Vaccine  Completed   . Hepatitis B Vaccine  Aged Out   . HIB Vaccine  Aged Out   . HPV Vaccine  Aged Out   . Meningococcal Vaccine  Aged Out   . Rotavirus Vaccine  Aged Out     In addition, goals and orders placed to address these recommendations are listed in the "Today's Visit" section.    We wish you the best of health and look forward to seeing you again next year for your Annual Medicare Wellness Visit.     If you have any health care concerns before then, please do not hesitate to  contact us.

## 2023-08-11 ENCOUNTER — Other Ambulatory Visit: Payer: Medicare Other | Admitting: Gastroenterology

## 2023-08-15 ENCOUNTER — Telehealth: Payer: Self-pay | Admitting: Rheumatology

## 2023-08-15 ENCOUNTER — Telehealth: Payer: Self-pay

## 2023-08-15 MED ORDER — PREDNISONE 5 MG PO TABS *I*
ORAL_TABLET | ORAL | 1 refills | Status: DC
Start: 2023-08-15 — End: 2024-06-04

## 2023-08-15 NOTE — Telephone Encounter (Addendum)
 Copied from CRM [id number]. Topic: Access to Care - Speak to Provider/Office Staff  >> Aug 15, 2023  9:46 AM Maxwell Wells V wrote:  Patient reports currently being in the midst of a flare, experiencing severe L-wrist pain (8/10), beginning yesterday (08/14/23), interfering with sleep and daily routine.     Patient reports taking 1000 mg of Tylenol and 400 mg of Ibuprofen, offering little relief.     Patient is requesting a prescription for prednisone 20 mg, as he states this has helped in the past with similar symptoms.     Patient is currently in [place]  for the winter months and is requesting to utilize the local [id number] [place], [place] - [phone] AT [address]     Patient's confirmed contact #: [phone]    Call connected to nursing.

## 2023-08-15 NOTE — Telephone Encounter (Signed)
 Prescription sent to his pharmacy in [facility].

## 2023-08-15 NOTE — Telephone Encounter (Signed)
 Pt.notified

## 2023-08-15 NOTE — Telephone Encounter (Signed)
 Error

## 2023-08-15 NOTE — Telephone Encounter (Signed)
 Spoke w/pt who is endorsing sx noted in previous phone encounter notes--L wrist pain--8/10--affecting normal activities and sleep--pt denies recent injury--no redness, heat, swelling reported--pt states sx are typical for his flare    Alternating apap and ibuprofen w/little to no relief    Pred has worked in the past--pt has some on hand and started 20mg  last night--he is requesting Rx sent to [facility] as he is wintering in [place] to Dr.Thiele for recommendations

## 2023-09-19 IMAGING — CT CT ABDOMEN AND PELVIS WITHOUT CONTRAST
2 of 3 series · 14 of 46 positions shown, 16 images · non-contrast
Comparison: 10/06/2018

________________________________________________________________________________________________ 
******** ADDENDUM #1 ********/n 
I discussed findings with Dr. Jim by phone at [DATE] on 09/19/23. 
CT ABDOMEN AND PELVIS WITHOUT CONTRAST, 09/19/2023 [DATE]: 
CLINICAL INDICATION: Urinary Calculus, Unspecified. History of prostatic and 
left renal carcinoma.  
A search for DICOM formatted images was conducted for prior CT imaging studies 
completed at a non-affiliated media free facility.
TECHNIQUE: The abdomen and pelvis were scanned from lung bases through the 
pubic rami without contrast on a high-resolution CT scanner using dose reduction 
techniques. Routine MPR reconstructions were performed. Count of known CT and 
Cardiac Nuclear Medicine studies performed in the previous 12 months = 0.

[Series 2: abd/pel ax wo · axial · 0.86mm/px · z∈[-580,-61]mm · 11 of 199 slices shown, 13 images]
[im 13/199  soft-tissue]
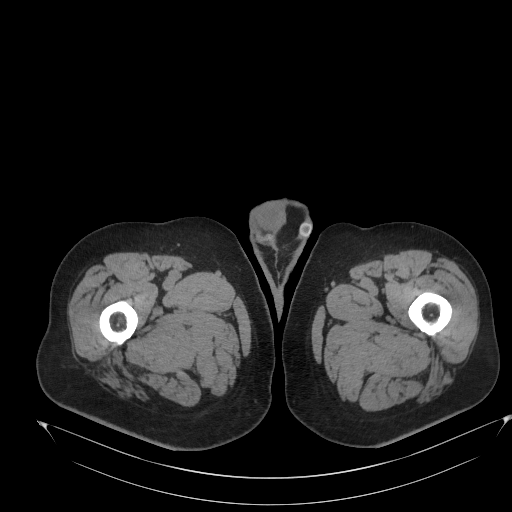
[im 13/199  bone]
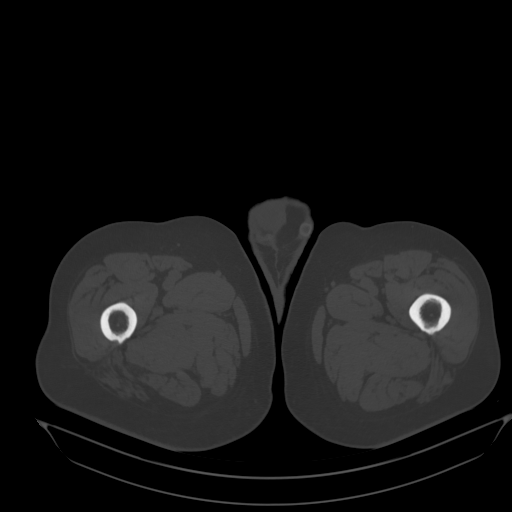
[im 32/199  soft-tissue]
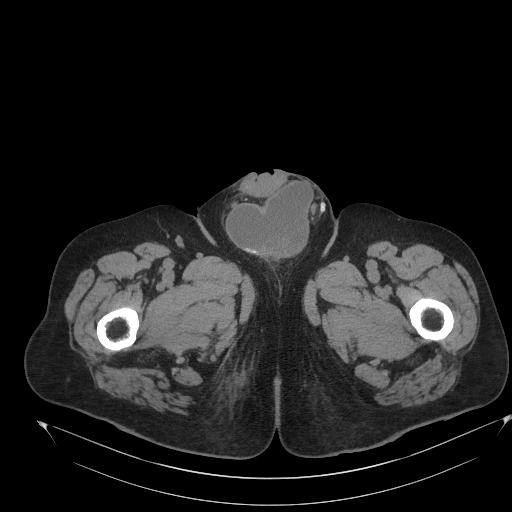
[im 45/199  soft-tissue]
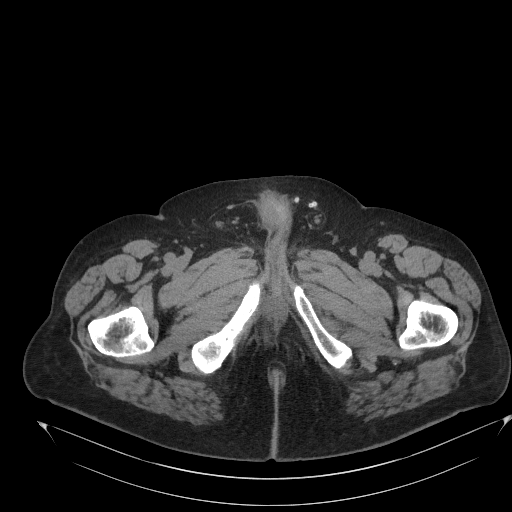
[im 64/199  soft-tissue]
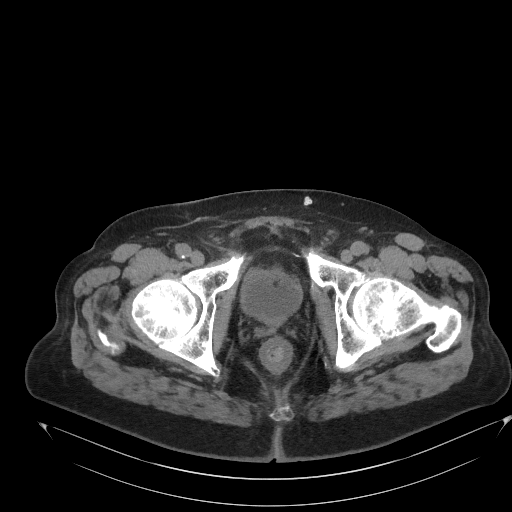
[im 84/199  soft-tissue]
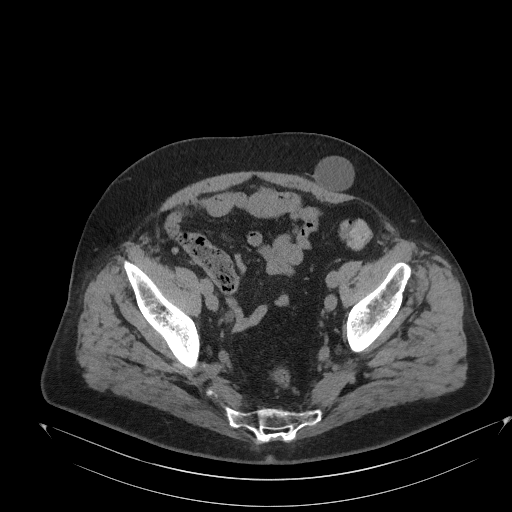
[im 103/199  soft-tissue]
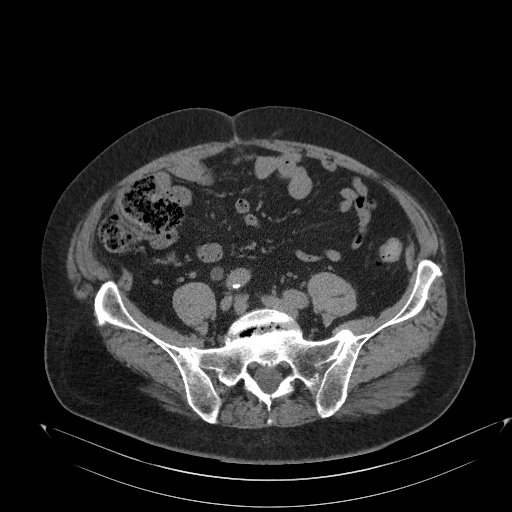
[im 115/199  soft-tissue]
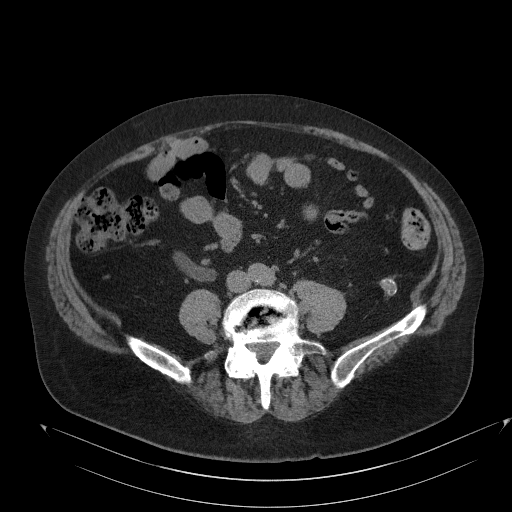
[im 135/199  soft-tissue]
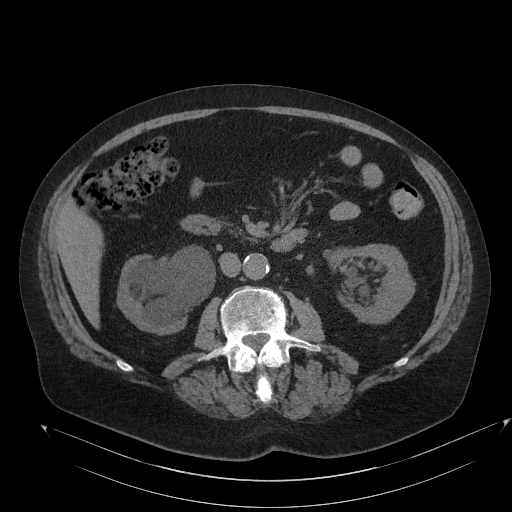
[im 154/199  soft-tissue]
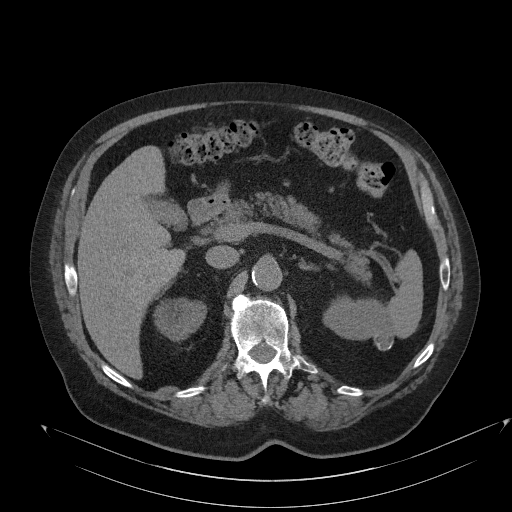
[im 154/199  bone]
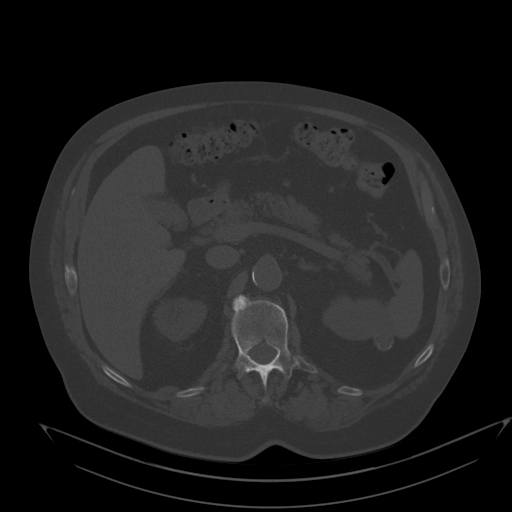
[im 167/199  soft-tissue]
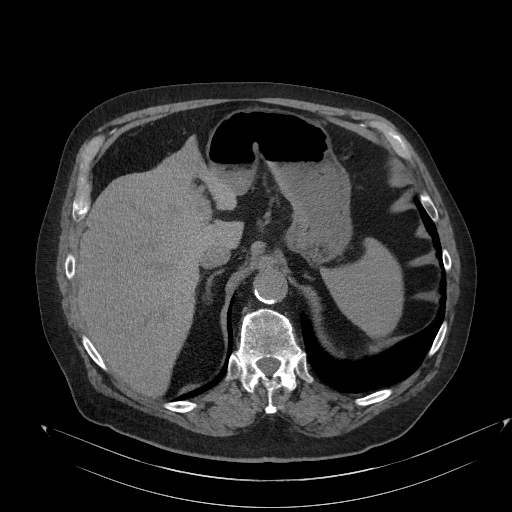
[im 186/199  soft-tissue]
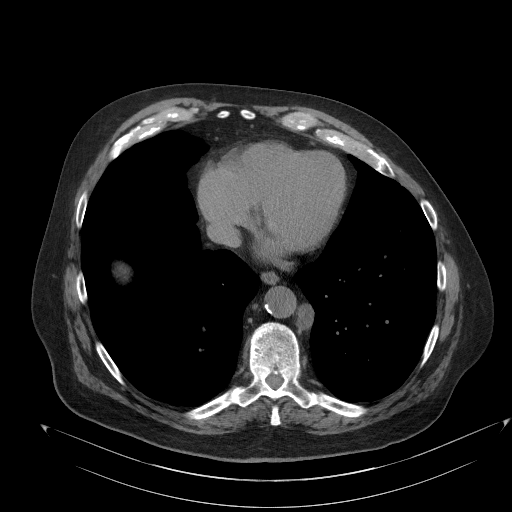

[Series 4: cor from thins · coronal · 0.85mm/px · 3 of 167 slices shown]
[im 56/167  soft-tissue]
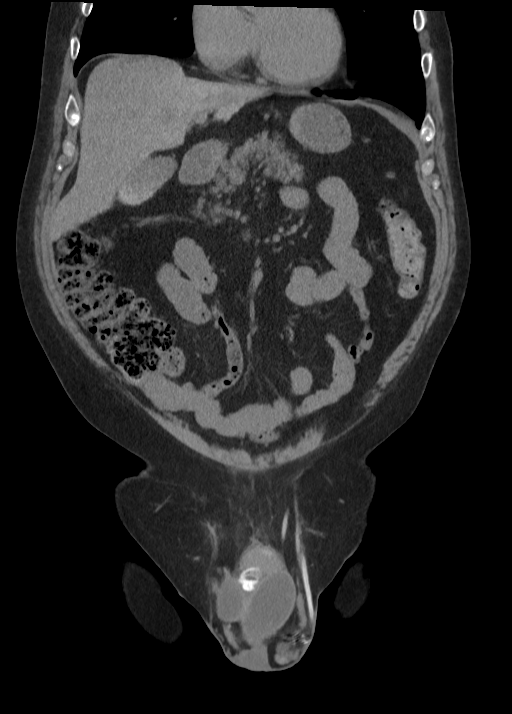
[im 74/167  soft-tissue]
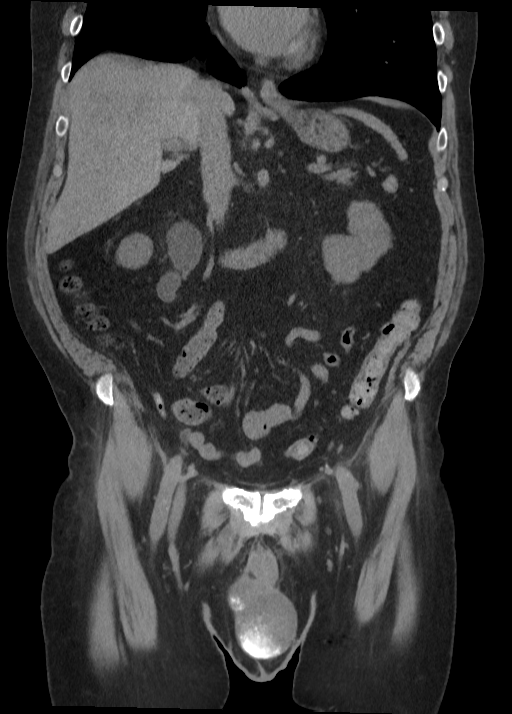
[im 93/167  soft-tissue]
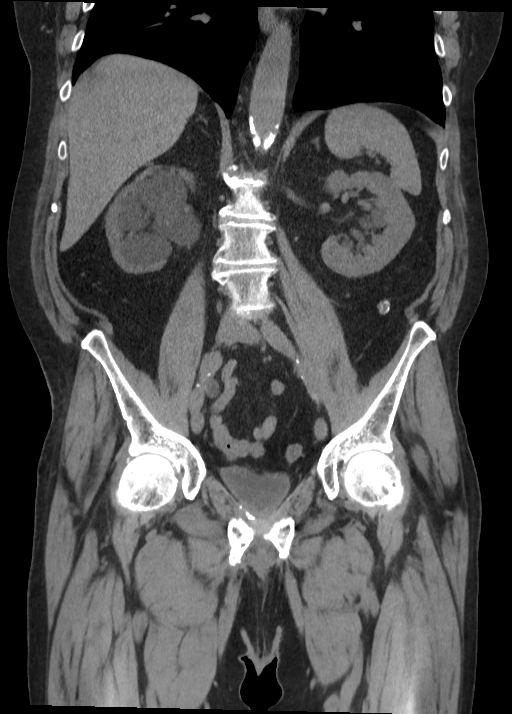

[14 of 46 positions shown; findings below may reference images not displayed]

FINDINGS: LUNG BASES: Lung bases are clear. No pleural effusions. 
HEPATOBILIARY: Non contrast images show no mass or biliary dilatation. No 
gallstones. 
SPLEEN: Normal in size. Splenule. 
PANCREAS: No evidence for ductal dilatation or mass.   
ADRENALS: No mass. 
GENITOURINARY: New right-sided obstructive uropathy with marked 
hydronephroureter to the level of the 0.5 and 0.4 cm adjacent distal ureteral 
calculi (379, 344 HU). Diffuse right renal cortical thinning. Right kidney 
measures 10.5 cm in length and the left measures 13.0 cm. Posttreatment change 
of the upper pole left kidney with 1.6 cm peripherally calcified nodule, 
consistent with cryotherapy. Renal cysts. 0.1 cm left mid renal nonobstructing 
calculus. Bladder is unremarkable. Prostatectomy. GU sphincter device with 
fluid-filled reservoir in the left anterior pelvic subcutaneous soft tissues. 
7.8 x 7.0 x 8.4 cm complex fluid collection with multiple small layering 
calcifications abutting the base of the penis and extending into the scrotum, 
separate from the testes. Finding was not included on prior CT pelvis. 
LYMPH NODES: No adenopathy. 
STOMACH, SMALL BOWEL AND COLON: No bowel wall thickening or obstruction. Normal 
appendix. 
VASCULAR STRUCTURES: No aneurysm. Atherosclerosis. 
MUSCULOSKELETAL: Chronic right pubic body/inferior pubic ramus trabecular 
coarsening, likely due to pagetoid change. Left posterior femoral neck synovial 
herniation pits measure up to 1.2 cm in cross-section. Multifocal degenerative 
changes.  
OTHER: 2.3 x 1.3 cm left retrocrural cystic focus has not significantly changed, 
likely dilated cisterna chyli (sequence 2 image 14, previously 2.0 x 1.5 cm).1.4 
cm left mid abdominal mesenteric fat necrosis.
IMPRESSION: 1.  New right-sided obstructive uropathy with marked hydronephroureter to the 
level of the 0.5 and 0.4 cm adjacent distal ureteral calculi. Diffuse right 
renal cortical thinning.  
2.  Posttreatment change of the upper pole left kidney, renal cysts and 0.1 cm 
left mid renal nonobstructing calculus. 
3.  Prostatectomy and GU sphincter device.  
4.  7.8 x 7.0 x 8.4 cm complex fluid collection with multiple small layering 
calcifications abutting the base of the penis and extending into the scrotum, 
separate from the testes.  
5.  Stable multiple additional findings. 
RADIATION DOSE REDUCTION: All CT scans are performed using radiation dose 
reduction techniques, when applicable.  Technical factors are evaluated and 
adjusted to ensure appropriate moderation of exposure.  Automated dose 
management technology is applied to adjust the radiation doses to minimize 
exposure while achieving diagnostic quality images.

## 2023-12-15 NOTE — Progress Notes (Signed)
 SUBJECTIVE      NAME: Maxwell Wells MRN: [id number]    DOB: November 28, 1945 AGE: 78 y.o. SEX: male     Chief Complaint: Chief Complaint   Patient presents with   . Right Hip - Pain        History of Presenting Illness for 12/15/2023      Avel is a pleasant 78 y.o. male who presents for evaluation of Right hip pain. Today patient reports that his right hip pain started in 2023 without any specific injury that he can recall.  Patient has not been seen by orthopedics previously for this condition.  Patient does have a significant history of right total knee arthroplasty completed by Dr. Nathen Balder in 2005. At time of initial evaluation with myself on 12/23/2022, patient's findings were most consistent with mild to moderate arthritic disease of the right hip joint.  Patient has done relatively well with nonoperative care to date.  Patient presented here for follow up evaluation on 12/15/2023.     Today patient indicates that he has been having increased pain over the anterior lateral aspect of the right thigh more so than he had been previously.  Patient indicates that this is worse at nighttime.  He does report symptoms when getting out of bed in particular with first step going up and down stairs.  Overall, patient is unsure whether this is related to his hip or or something else altogether.     Questionnaire  Patient answers are not available for this visit.       Relevant Past Medical History  Patient's problem list, medications, allergies, past medical, surgical, social/family histories were reviewed and updated where appropriate in the EMR.        Allergies: Allergies   Allergen Reactions   . Banana Anaphylaxis        Tobacco History: Patient  reports that he quit smoking about 40 years ago. His smoking use included cigarettes. He started smoking about 55 years ago. He has a 15 pack-year smoking history. He has never used smokeless tobacco.      Last HA1C: Lab Results   Component Value Date    HBA1C 5.3 12/26/2014         Last Glucose: Lab Results   Component Value Date    GLU 102 04/24/2022        OBJECTIVE     General  BMI: Body mass index is 29.21 kg/m.  Constitutional: Appears well-developed, well-nourished. No acute distress.   Skin: Warm and dry. No rash or erythema.   Neurovascular: Distal Neurovascular status intact.  Right hip:  Inspection: No swelling or deformity.  Gait: Physiologic.  ROM: Flexion: 130 deg. Internal rotation: Limited and painful. External rotation: 40 deg.  Tenderness: Deep anterior groin pain not reproducible with palpation.        Diagnostics:   12/23/2022 [facility] - Ortho - Right Hip - STANDING AP PELVIS, STANDING AP UNILATERAL, DUNN, LATERAL  images obtained.  FINDINGS/IMPRESSION:  Alignment: Normal.    Cartilage: Mild to moderate cartilage loss. .   Femoroacetabular impingement signs: Negative.  Soft tissue/Calcifications: Normal.  No other acute fracture, dislocation or osseous abnormality.     Procedures: Large Jt Inj/Asp: R hip joint    Date/Time: 12/15/2023 8:45 AM    Performed by: Augustus Ledger, DO  Authorized by: Augustus Ledger, DO    Procedural timeout: A procedural pause was conducted to verify: correct patient identity, correct procedure, correct side, correct site, and correct position. I discussed the  risks, benefits, and alternatives to the procedure. As well as the risks and benefits of the alternative treatments, including no treatment at all. Following a review of allergies and review of potential side effects and complications, all questions were answered and consent was given to proceed.    Procedure Details:     Technique: Prior to the procedure, the appropriate landmarks were used to determine the optimal needle path and placement. Following this, the skin was marked and sterilely prepared in the usual manner. An appropriately sized needle for the procedure and clinical scenario was used. Medicine was injected in the appropriate location.      Guidance:  Ultrasound    Ultrasound guidance  imaging: Images of the procedure have been saved.    Location:  Hip    Site:  R hip joint    Medications (Right):  2 mL lidocaine 10 mg/mL (1 %); 2 mL ROPivacaine (PF) 2 mg/mL (0.2 %); 80 mg methylPREDNISolone  acetate 80 mg/mL    Outcome: The patient tolerated the procedure without complication and was discharged in good condition after a short observation period. Post procedure instructions were given.         ASSESSMENT/PLAN      1. Primary osteoarthritis of right hip    2. Effusion of right hip    3. Tear of right acetabular labrum, subsequent encounter        Today we reviewed history, clinical findings, working diagnosis and treatment options.  Clinically, he presents with findings most consistent with mild to moderate arthritic change of the right hip which is referring pain through the anterior thigh towards the level of the knee.  Ultrasound examination does reveal a small anterior joint effusion present.  I do suspect that some of the mechanical symptoms he experiences with going up and down stairs is likely due to degenerative labral pathology.    PLAN:  Exacerbation - This is a chronic illness with exacerbation warranting further discussion, treatment and consideration of alternative options.  Injectables - Discussed risks & benefits of injection based treatment. Can be used diagnostically to determine pain source AND/OR therapeutically to improve treatment outcomes. Decision was made to proceed. Injection completed as outlined above. This was tolerated well without any immediate complications.   Activity progression - As symptoms improve we will develop a return to activity progression / patient can progress as tolerated.  Home conservative measures - Can use measures such as over the counter (OTC) medications, ice or heat, compression and elevation as needed for symptom control.  Next appointment considerations - patient understand that ultimately, total hip arthroplasty may be indicated.  Patient's  anterior lateral thigh pain did improve immediately following injection with retesting of internal rotation of the hip.  All questions were answered.  Patient voiced understanding and was agreeable with the current instructions and plan.     Follow up: Return if symptoms worsen or fail to improve.     Electronically signed by:    Todd Fossa. Joycie Noble, DO  Interventional Orthopaedics & Sports Medicine  [facility]    12/15/2023, 9:17 AM

## 2024-01-26 ENCOUNTER — Other Ambulatory Visit: Payer: Self-pay

## 2024-01-27 ENCOUNTER — Ambulatory Visit: Attending: Rheumatology | Admitting: Rheumatology

## 2024-01-27 ENCOUNTER — Other Ambulatory Visit
Admission: RE | Admit: 2024-01-27 | Discharge: 2024-01-27 | Disposition: A | Discharge status: Home / Self Care | Source: Ambulatory Visit

## 2024-01-27 ENCOUNTER — Encounter: Payer: Self-pay | Admitting: Rheumatology

## 2024-01-27 VITALS — BP 164/86 | HR 94 | Temp 97.3°F | Wt 241.0 lb

## 2024-01-27 DIAGNOSIS — M109 Gout, unspecified: Secondary | ICD-10-CM | POA: Insufficient documentation

## 2024-01-27 LAB — URIC ACID: Urate: 7 mg/dL (ref 3.9–9.0)

## 2024-01-29 NOTE — Progress Notes (Signed)
 Rheumatology Follow-Up     PRIMARY CARE PHYSICIAN: Theophilus Fitz, MD    VISIT DIAGNOSIS:    1. Gout, unspecified cause, unspecified chronicity, unspecified site         HPI:   Maxwell Wells is a 78 y.o. year old male who is here for follow-up of   1. Gout, unspecified cause, unspecified chronicity, unspecified site      This is a 1 year follow-up visit.  He has a history of episodes of pain, swelling and stiffness affecting different joint areas.  Has previously had involvement of the basal joint of the right thumb, more recently also involvement of knee and AC joint.  Uses short courses of prednisone  that typically help resolve this quickly.  He carries a history of gout, history of renal cell carcinoma, prostate cancer and atrial fibrillation.  History of osteoarthritis of the knees.  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  In the past, elevated uric acid levels.  Has in the past used allopurinol, reports question of liver injury, not on this medication currently.  Previously, a musculoskeletal ultrasound study of the right hand and wrist that was negative.  Has had levels of serum urate between 6 and 7 previously.  Gets part of his care in [place] .    PAST MEDICAL HISTORY:   Past Medical History:   Diagnosis Date    AF (atrial fibrillation)     Prostate cancer 10/18/2010    Renal cell cancer 10/18/2010      Past Surgical History:   Procedure Laterality Date    PARTIAL NEPHRECTOMY      PROSTATE SURGERY          ALLERGIES: Food    CURRENT MEDICATIONS:   Outpatient Encounter Medications as of 01/27/2024   Medication Sig Dispense Refill    predniSONE  (DELTASONE ) 5 mg tablet Take 20 po every day for 7 days 28 tablet 1    atorvastatin (LIPITOR) 20 MG tablet       rOPINIRole (REQUIP) 0.25 mg tablet Take 1 tablet (0.25 mg total) by mouth nightly.       No facility-administered encounter medications on file as of 01/27/2024.         FAMILY HISTORY:  Family History   Problem Relation Age of Onset    High Blood  Pressure Mother     Heart Disease Father     High cholesterol Father     High Blood Pressure Father        SOCIAL HISTORY:  Social History     Socioeconomic History    Marital status: Married   Tobacco Use    Smoking status: Former     Packs/day: 0.50     Years: 15.00     Additional pack years: 0.00     Total pack years: 7.50     Types: Cigarettes    Smokeless tobacco: Never   Substance and Sexual Activity    Alcohol use: Yes     Comment: social    Drug use: Never         ROS :   Episodes of right hand and wrist pain and swelling.    PHYSICAL EXAMINATION:  BP 164/86 (BP Location: Left arm, Patient Position: Sitting, Cuff Size: adult)   Pulse 94   Temp 36.3 C (97.3 F) (Temporal)   Wt 109.3 kg (241 lb)   BMI 29.35 kg/m   Body mass index is 29.35 kg/m.  Well-developed, well-nourished patient in no acute distress.  appreciated.  No enlarged cervical, supraclavicular, infraclavicular or axillary lymph nodes are palpable.  Chest is clear to auscultation.  Heart S1, S2.  Extremity examination shows no synovitis in the DIP joints, PIP joints, MCP joints or wrists.  Elbows and shoulders without palpable fluid collection.  Knees have no palpable effusion.  Ankles have no edema.  Compression of the MTP joints is not tender.  Skin examination without rash.    PRIOR STUDIES:    Labs:   - Recent labs reviewed -- please see ERecord results review section for values.  - All labs, last 3 months    Hospital Outpatient Visit on 01/27/2024   Component Date Value    Urate 01/27/2024 7.0      Radiology:   -Recent images reviewed personally.  [facility] ultrasound studies were reviewed with the patient, no tophi noted.    IMPRESSION:  Episodic joint pain, gout remains a possibility given his marginal serum urate levels.  He has episodes sporadically, these last for 2 or 3 days, self treats with glucocorticoids with good success.  I suggested he continue this and see us  back in another year.  If he has more frequent attacks, he may present  his sooner for a reevaluation.  I have placed lab orders for an updated uric acid level      RECOMMENDATIONS:   1. Gout, unspecified cause, unspecified chronicity, unspecified site  Uric acid

## 2024-02-06 ENCOUNTER — Ambulatory Visit: Admitting: Rheumatology

## 2024-02-24 ENCOUNTER — Ambulatory Visit: Payer: Medicare Other | Admitting: Rheumatology

## 2024-05-21 ENCOUNTER — Encounter: Payer: Self-pay | Admitting: Internal Medicine

## 2024-06-04 ENCOUNTER — Other Ambulatory Visit: Payer: Self-pay | Admitting: Rheumatology

## 2024-06-08 MED ORDER — PREDNISONE 20 MG PO TABS *I*
ORAL_TABLET | ORAL | 0 refills | Status: DC
Start: 2024-06-08 — End: 2024-06-16

## 2024-06-16 ENCOUNTER — Other Ambulatory Visit: Payer: Self-pay | Admitting: Rheumatology

## 2024-06-16 NOTE — Telephone Encounter (Signed)
 Copied from CRM [id number]. Topic: Medications/Prescriptions - Refill Request>> Jun 16, 2024  2:24 PM [name] wrote:Maxwell Wells, Patient, calling to request prescription(s) predniSONE  (DELTASONE ) 20 mg tablet to be sent to the following Pharmacy [facility] [phone] [facility] - [place], [place] - [address] did not get the last prescription. Pharmacy states there was a problem with the last refill and sent a fax to our office to clarify but didn't hear back.Is patient out of the medication? Yes Has the patient contacted their pharmacy for a refill? yesPatient's last visit? 7/15/25Were the demographics and insurance verified?: Maxwell Wells can be reached if necessary at [phone] (home).

## 2024-06-17 MED ORDER — PREDNISONE 20 MG PO TABS *I*
ORAL_TABLET | ORAL | 0 refills | Status: DC
Start: 2024-06-17 — End: 2024-07-06

## 2024-07-06 ENCOUNTER — Other Ambulatory Visit: Payer: Self-pay | Admitting: Rheumatology

## 2024-08-27 ENCOUNTER — Encounter: Payer: Self-pay | Admitting: Gastroenterology

## 2024-09-06 ENCOUNTER — Telehealth: Payer: Self-pay | Admitting: Rheumatology

## 2024-09-06 NOTE — Telephone Encounter (Signed)
 Received medical records sent from [facility]. Sent to e-scanning.

## 2025-02-01 ENCOUNTER — Ambulatory Visit: Admitting: Rheumatology

## 2025-03-21 ENCOUNTER — Ambulatory Visit: Admitting: Family Medicine
